# Patient Record
Sex: Female | Born: 1975 | Race: White | Hispanic: No | Marital: Single | Smoking: Former smoker
Health system: Southern US, Community
[De-identification: ages and names within clinical notes are randomized; demographics above are authoritative.]

## PROBLEM LIST (undated history)

## (undated) DIAGNOSIS — I2510 Atherosclerotic heart disease of native coronary artery without angina pectoris: Secondary | ICD-10-CM

## (undated) DIAGNOSIS — F4310 Post-traumatic stress disorder, unspecified: Secondary | ICD-10-CM

## (undated) DIAGNOSIS — I509 Heart failure, unspecified: Secondary | ICD-10-CM

## (undated) DIAGNOSIS — E279 Disorder of adrenal gland, unspecified: Secondary | ICD-10-CM

## (undated) DIAGNOSIS — Z95810 Presence of automatic (implantable) cardiac defibrillator: Secondary | ICD-10-CM

## (undated) DIAGNOSIS — E282 Polycystic ovarian syndrome: Secondary | ICD-10-CM

## (undated) DIAGNOSIS — Z8719 Personal history of other diseases of the digestive system: Secondary | ICD-10-CM

## (undated) DIAGNOSIS — I513 Intracardiac thrombosis, not elsewhere classified: Secondary | ICD-10-CM

## (undated) DIAGNOSIS — G8929 Other chronic pain: Secondary | ICD-10-CM

## (undated) DIAGNOSIS — I999 Unspecified disorder of circulatory system: Secondary | ICD-10-CM

## (undated) DIAGNOSIS — Z7901 Long term (current) use of anticoagulants: Secondary | ICD-10-CM

## (undated) DIAGNOSIS — I739 Peripheral vascular disease, unspecified: Secondary | ICD-10-CM

## (undated) DIAGNOSIS — I7410 Embolism and thrombosis of unspecified parts of aorta: Secondary | ICD-10-CM

## (undated) DIAGNOSIS — I5022 Chronic systolic (congestive) heart failure: Secondary | ICD-10-CM

## (undated) DIAGNOSIS — I429 Cardiomyopathy, unspecified: Secondary | ICD-10-CM

## (undated) DIAGNOSIS — I700 Atherosclerosis of aorta: Secondary | ICD-10-CM

## (undated) DIAGNOSIS — I998 Other disorder of circulatory system: Secondary | ICD-10-CM

## (undated) DIAGNOSIS — T82198A Other mechanical complication of other cardiac electronic device, initial encounter: Secondary | ICD-10-CM

## (undated) DIAGNOSIS — F319 Bipolar disorder, unspecified: Secondary | ICD-10-CM

## (undated) DIAGNOSIS — E278 Other specified disorders of adrenal gland: Secondary | ICD-10-CM

## (undated) DIAGNOSIS — I428 Other cardiomyopathies: Secondary | ICD-10-CM

## (undated) DIAGNOSIS — IMO0002 Reserved for concepts with insufficient information to code with codable children: Secondary | ICD-10-CM

## (undated) HISTORY — DX: Intracardiac thrombosis, not elsewhere classified: I51.3

## (undated) HISTORY — DX: Other disorder of circulatory system: I99.8

## (undated) HISTORY — DX: Bipolar disorder, unspecified: F31.9

## (undated) HISTORY — DX: Other cardiomyopathies: I42.8

## (undated) HISTORY — DX: Chronic systolic (congestive) heart failure: I50.22

## (undated) HISTORY — DX: Unspecified disorder of circulatory system: I99.9

## (undated) HISTORY — DX: Other mechanical complication of other cardiac electronic device, initial encounter: T82.198A

## (undated) HISTORY — DX: Heart failure, unspecified: I50.9

## (undated) HISTORY — DX: Cardiomyopathy, unspecified: I42.9

## (undated) HISTORY — DX: Atherosclerotic heart disease of native coronary artery without angina pectoris: I25.10

## (undated) HISTORY — DX: Long term (current) use of anticoagulants: Z79.01

## (undated) HISTORY — DX: Peripheral vascular disease, unspecified: I73.9

---

## 2012-07-18 DIAGNOSIS — I513 Intracardiac thrombosis, not elsewhere classified: Secondary | ICD-10-CM

## 2012-07-18 HISTORY — DX: Intracardiac thrombosis, not elsewhere classified: I51.3

## 2012-08-01 ENCOUNTER — Emergency Department (HOSPITAL_COMMUNITY): Payer: Worker's Compensation

## 2012-08-01 ENCOUNTER — Encounter (HOSPITAL_COMMUNITY): Payer: Self-pay | Admitting: Emergency Medicine

## 2012-08-01 ENCOUNTER — Inpatient Hospital Stay (HOSPITAL_COMMUNITY)
Admission: EM | Admit: 2012-08-01 | Discharge: 2012-08-10 | DRG: 238 | Disposition: A | Discharge status: Home Health | Payer: 59 | Attending: Vascular Surgery | Admitting: Vascular Surgery

## 2012-08-01 DIAGNOSIS — R209 Unspecified disturbances of skin sensation: Secondary | ICD-10-CM | POA: Diagnosis present

## 2012-08-01 DIAGNOSIS — F411 Generalized anxiety disorder: Secondary | ICD-10-CM | POA: Diagnosis present

## 2012-08-01 DIAGNOSIS — I429 Cardiomyopathy, unspecified: Secondary | ICD-10-CM

## 2012-08-01 DIAGNOSIS — I7409 Other arterial embolism and thrombosis of abdominal aorta: Principal | ICD-10-CM

## 2012-08-01 DIAGNOSIS — Z95828 Presence of other vascular implants and grafts: Secondary | ICD-10-CM

## 2012-08-01 DIAGNOSIS — Z23 Encounter for immunization: Secondary | ICD-10-CM

## 2012-08-01 DIAGNOSIS — I513 Intracardiac thrombosis, not elsewhere classified: Secondary | ICD-10-CM

## 2012-08-01 DIAGNOSIS — I70219 Atherosclerosis of native arteries of extremities with intermittent claudication, unspecified extremity: Secondary | ICD-10-CM

## 2012-08-01 DIAGNOSIS — F3289 Other specified depressive episodes: Secondary | ICD-10-CM | POA: Diagnosis present

## 2012-08-01 DIAGNOSIS — F329 Major depressive disorder, single episode, unspecified: Secondary | ICD-10-CM | POA: Diagnosis present

## 2012-08-01 DIAGNOSIS — I998 Other disorder of circulatory system: Secondary | ICD-10-CM

## 2012-08-01 DIAGNOSIS — E282 Polycystic ovarian syndrome: Secondary | ICD-10-CM | POA: Diagnosis present

## 2012-08-01 DIAGNOSIS — Z7901 Long term (current) use of anticoagulants: Secondary | ICD-10-CM

## 2012-08-01 DIAGNOSIS — I743 Embolism and thrombosis of arteries of the lower extremities: Secondary | ICD-10-CM | POA: Diagnosis present

## 2012-08-01 DIAGNOSIS — I428 Other cardiomyopathies: Secondary | ICD-10-CM | POA: Diagnosis present

## 2012-08-01 DIAGNOSIS — Z720 Tobacco use: Secondary | ICD-10-CM

## 2012-08-01 DIAGNOSIS — F17200 Nicotine dependence, unspecified, uncomplicated: Secondary | ICD-10-CM | POA: Diagnosis present

## 2012-08-01 DIAGNOSIS — D62 Acute posthemorrhagic anemia: Secondary | ICD-10-CM | POA: Diagnosis not present

## 2012-08-01 HISTORY — DX: Polycystic ovarian syndrome: E28.2

## 2012-08-01 LAB — POCT I-STAT, CHEM 8
BUN: 6 mg/dL (ref 6–23)
Calcium, Ion: 1.16 mmol/L (ref 1.12–1.23)
Chloride: 96 mEq/L (ref 96–112)
Creatinine, Ser: 0.6 mg/dL (ref 0.50–1.10)
Glucose, Bld: 95 mg/dL (ref 70–99)
HCT: 42 % (ref 36.0–46.0)
Hemoglobin: 14.3 g/dL (ref 12.0–15.0)
Potassium: 3.6 mEq/L (ref 3.5–5.1)
Sodium: 132 mEq/L — ABNORMAL LOW (ref 135–145)
TCO2: 26 mmol/L (ref 0–100)

## 2012-08-01 LAB — SEDIMENTATION RATE: Sed Rate: 27 mm/hr — ABNORMAL HIGH (ref 0–22)

## 2012-08-01 MED ORDER — DIAZEPAM 5 MG/ML IJ SOLN
5.0000 mg | Freq: Once | INTRAMUSCULAR | Status: AC
  Administered 2012-08-01: 5 mg via INTRAVENOUS
  Filled 2012-08-01: qty 2

## 2012-08-01 MED ORDER — MORPHINE SULFATE 2 MG/ML IJ SOLN
2.0000 mg | INTRAMUSCULAR | Status: DC | PRN
  Administered 2012-08-01 – 2012-08-02 (×3): 2 mg via INTRAVENOUS
  Administered 2012-08-02: 4 mg via INTRAVENOUS
  Administered 2012-08-02: 2 mg via INTRAVENOUS
  Administered 2012-08-02: 4 mg via INTRAVENOUS
  Filled 2012-08-01: qty 1
  Filled 2012-08-01: qty 2
  Filled 2012-08-01 (×2): qty 1
  Filled 2012-08-01: qty 2

## 2012-08-01 MED ORDER — IOHEXOL 350 MG/ML SOLN
100.0000 mL | Freq: Once | INTRAVENOUS | Status: AC | PRN
  Administered 2012-08-01: 100 mL via INTRAVENOUS

## 2012-08-01 MED ORDER — HYDROMORPHONE HCL PF 1 MG/ML IJ SOLN
0.5000 mg | Freq: Once | INTRAMUSCULAR | Status: AC
  Administered 2012-08-01: 0.5 mg via INTRAVENOUS
  Filled 2012-08-01: qty 1

## 2012-08-01 MED ORDER — LORAZEPAM 2 MG/ML IJ SOLN
1.0000 mg | Freq: Once | INTRAMUSCULAR | Status: AC
Start: 2012-08-01 — End: 2012-08-01
  Administered 2012-08-01: 1 mg via INTRAVENOUS
  Filled 2012-08-01: qty 1

## 2012-08-01 MED ORDER — HEPARIN BOLUS VIA INFUSION
4000.0000 [IU] | Freq: Once | INTRAVENOUS | Status: AC
  Administered 2012-08-01: 4000 [IU] via INTRAVENOUS

## 2012-08-01 MED ORDER — SODIUM CHLORIDE 0.9 % IV BOLUS (SEPSIS)
1000.0000 mL | Freq: Once | INTRAVENOUS | Status: AC
  Administered 2012-08-01: 1000 mL via INTRAVENOUS

## 2012-08-01 MED ORDER — HEPARIN (PORCINE) IN NACL 100-0.45 UNIT/ML-% IJ SOLN
1000.0000 [IU]/h | INTRAMUSCULAR | Status: DC
  Administered 2012-08-01: 1000 [IU]/h via INTRAVENOUS
  Filled 2012-08-01: qty 250

## 2012-08-01 NOTE — ED Notes (Signed)
Patient states she was setting a tray down at work and injured her lower back.  Patient also reports numbness and tingling in bilateral legs and feet.

## 2012-08-01 NOTE — ED Provider Notes (Signed)
Patient with x-ray findings consistent with aortic occlusion. Patient to be transferred over to [facility] to see the vascular surgeon.  Toy Baker, MD 08/01/12 [phone]

## 2012-08-01 NOTE — ED Provider Notes (Signed)
Medical screening examination/treatment/procedure(s) were conducted as a shared visit with non-physician practitioner(s) and myself.  I personally evaluated the patient during the encounter.  37yF with lower back pain and numbness/tingling in feet. Atraumatic back pain. Onset months ago. Exam significant for R foot drop. B/l feet cool. Delayed cap refill. I could not palpate or doppler a DP or PT pulse in either foot. Possibly two different processes?   Will CTA to eval for vascular pathology. Given back pain will also MR for compressive etiology. No obvious toxin. No hx of diabetes.   Raeford Razor, MD 08/01/12 [phone]
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
## 2012-08-02 ENCOUNTER — Inpatient Hospital Stay (HOSPITAL_COMMUNITY): Payer: Worker's Compensation | Admitting: Anesthesiology

## 2012-08-02 ENCOUNTER — Inpatient Hospital Stay (HOSPITAL_COMMUNITY): Payer: Worker's Compensation

## 2012-08-02 ENCOUNTER — Encounter (HOSPITAL_COMMUNITY): Payer: Self-pay | Admitting: Anesthesiology

## 2012-08-02 ENCOUNTER — Encounter (HOSPITAL_COMMUNITY)
Admission: EM | Disposition: A | Discharge status: Home Health | Payer: Self-pay | Source: Home / Self Care | Attending: Vascular Surgery

## 2012-08-02 ENCOUNTER — Encounter (HOSPITAL_COMMUNITY): Payer: Self-pay | Admitting: *Deleted

## 2012-08-02 HISTORY — PX: AORTA - BILATERAL FEMORAL ARTERY BYPASS GRAFT: SHX1175

## 2012-08-02 LAB — CBC
MCV: 83.9 fL (ref 78.0–100.0)
MCV: 85.9 fL (ref 78.0–100.0)
Platelets: 211 10*3/uL (ref 150–400)
Platelets: 187 10*3/uL (ref 150–400)
RBC: 3.41 MIL/uL — ABNORMAL LOW (ref 3.87–5.11)
RDW: 15.3 % (ref 11.5–15.5)
RDW: 15.6 % — ABNORMAL HIGH (ref 11.5–15.5)
WBC: 11 10*3/uL — ABNORMAL HIGH (ref 4.0–10.5)
WBC: 14.5 10*3/uL — ABNORMAL HIGH (ref 4.0–10.5)

## 2012-08-02 LAB — COMPREHENSIVE METABOLIC PANEL
ALT: 29 U/L (ref 0–35)
AST: 28 U/L (ref 0–37)
Albumin: 3.2 g/dL — ABNORMAL LOW (ref 3.5–5.2)
Chloride: 101 mEq/L (ref 96–112)
Creatinine, Ser: 0.61 mg/dL (ref 0.50–1.10)
Potassium: 3.7 mEq/L (ref 3.5–5.1)
Sodium: 136 mEq/L (ref 135–145)
Total Bilirubin: 0.2 mg/dL — ABNORMAL LOW (ref 0.3–1.2)

## 2012-08-02 LAB — URINALYSIS, ROUTINE W REFLEX MICROSCOPIC
Bilirubin Urine: NEGATIVE
Glucose, UA: NEGATIVE mg/dL
Nitrite: NEGATIVE
Specific Gravity, Urine: 1.013 (ref 1.005–1.030)
pH: 6 (ref 5.0–8.0)

## 2012-08-02 LAB — POCT I-STAT 3, ART BLOOD GAS (G3+)
Acid-base deficit: 1 mmol/L (ref 0.0–2.0)
Bicarbonate: 23.2 mEq/L (ref 20.0–24.0)
Patient temperature: 97.6
pCO2 arterial: 37 mmHg (ref 35.0–45.0)
pCO2 arterial: 38.3 mmHg (ref 35.0–45.0)
pH, Arterial: 7.394 (ref 7.350–7.450)
pH, Arterial: 7.403 (ref 7.350–7.450)
pO2, Arterial: 49 mmHg — ABNORMAL LOW (ref 80.0–100.0)

## 2012-08-02 LAB — BASIC METABOLIC PANEL
CO2: 23 mEq/L (ref 19–32)
Calcium: 8.1 mg/dL — ABNORMAL LOW (ref 8.4–10.5)
Chloride: 104 mEq/L (ref 96–112)
Creatinine, Ser: 0.47 mg/dL — ABNORMAL LOW (ref 0.50–1.10)
GFR calc Af Amer: >90 mL/min (ref >90)
Sodium: 134 mEq/L — ABNORMAL LOW (ref 135–145)

## 2012-08-02 LAB — MAGNESIUM: Magnesium: 1.7 mg/dL (ref 1.5–2.5)

## 2012-08-02 LAB — PROTIME-INR
INR: 1 (ref 0.00–1.49)
INR: 1.09 (ref 0.00–1.49)
Prothrombin Time: 14 seconds (ref 11.6–15.2)

## 2012-08-02 LAB — HEMOGLOBIN A1C
Hgb A1c MFr Bld: 5.4 % (ref <5.7)
Mean Plasma Glucose: 108 mg/dL (ref <117)

## 2012-08-02 LAB — C-REACTIVE PROTEIN: CRP: 7.8 mg/dL — ABNORMAL HIGH (ref <0.60)

## 2012-08-02 LAB — URINE MICROSCOPIC-ADD ON

## 2012-08-02 SURGERY — CREATION, BYPASS, ARTERIAL, AORTA TO FEMORAL, BILATERAL, USING GRAFT
Anesthesia: General | Site: Abdomen | Wound class: Clean

## 2012-08-02 MED ORDER — MIDAZOLAM HCL 2 MG/2ML IJ SOLN
INTRAMUSCULAR | Status: AC
  Filled 2012-08-02: qty 2

## 2012-08-02 MED ORDER — DEXTROSE 5 % IV SOLN
INTRAVENOUS | Status: AC
  Filled 2012-08-02: qty 50

## 2012-08-02 MED ORDER — ACETAMINOPHEN 325 MG PO TABS: 325.0000 mg | ORAL_TABLET | ORAL | Status: DC | PRN

## 2012-08-02 MED ORDER — MORPHINE SULFATE 2 MG/ML IJ SOLN
2.0000 mg | INTRAMUSCULAR | Status: DC | PRN
  Administered 2012-08-02: 2 mg via INTRAVENOUS
  Administered 2012-08-02: 5 mg via INTRAVENOUS
  Administered 2012-08-02: 2 mg via INTRAVENOUS
  Administered 2012-08-02: 4 mg via INTRAVENOUS
  Administered 2012-08-02: 5 mg via INTRAVENOUS
  Administered 2012-08-03: 4 mg via INTRAVENOUS
  Administered 2012-08-03 (×2): 2 mg via INTRAVENOUS
  Filled 2012-08-02: qty 2
  Filled 2012-08-02: qty 1
  Filled 2012-08-02 (×2): qty 2
  Filled 2012-08-02 (×3): qty 1
  Filled 2012-08-02: qty 3
  Filled 2012-08-02: qty 1

## 2012-08-02 MED ORDER — MELOXICAM 7.5 MG PO TABS
7.5000 mg | ORAL_TABLET | Freq: Every day | ORAL | Status: DC
  Filled 2012-08-02: qty 1

## 2012-08-02 MED ORDER — ONDANSETRON HCL 4 MG/2ML IJ SOLN
4.0000 mg | Freq: Four times a day (QID) | INTRAMUSCULAR | Status: DC | PRN
  Administered 2012-08-05 – 2012-08-08 (×2): 4 mg via INTRAVENOUS
  Filled 2012-08-02 (×2): qty 2

## 2012-08-02 MED ORDER — HYDRALAZINE HCL 20 MG/ML IJ SOLN
10.0000 mg | INTRAMUSCULAR | Status: DC | PRN
  Filled 2012-08-02: qty 0.5

## 2012-08-02 MED ORDER — ARTIFICIAL TEARS OP OINT
TOPICAL_OINTMENT | OPHTHALMIC | Status: DC | PRN
  Administered 2012-08-02: 1 via OPHTHALMIC

## 2012-08-02 MED ORDER — METOPROLOL TARTRATE 1 MG/ML IV SOLN: 2.0000 mg | INTRAVENOUS | Status: DC | PRN

## 2012-08-02 MED ORDER — FENTANYL CITRATE 0.05 MG/ML IJ SOLN
INTRAMUSCULAR | Status: DC | PRN
Start: 2012-08-02 — End: 2012-08-02
  Administered 2012-08-02: 100 ug via INTRAVENOUS
  Administered 2012-08-02 (×3): 50 ug via INTRAVENOUS
  Administered 2012-08-02 (×3): 100 ug via INTRAVENOUS
  Administered 2012-08-02: 50 ug via INTRAVENOUS

## 2012-08-02 MED ORDER — ACETAMINOPHEN 650 MG RE SUPP: 325.0000 mg | RECTAL | Status: DC | PRN

## 2012-08-02 MED ORDER — MIDAZOLAM HCL 5 MG/5ML IJ SOLN
INTRAMUSCULAR | Status: DC | PRN
  Administered 2012-08-02: 2 mg via INTRAVENOUS

## 2012-08-02 MED ORDER — POTASSIUM CHLORIDE 10 MEQ/50ML IV SOLN
INTRAVENOUS | Status: AC
  Administered 2012-08-02: 10 meq via INTRAVENOUS
  Filled 2012-08-02: qty 50

## 2012-08-02 MED ORDER — ONDANSETRON HCL 4 MG/2ML IJ SOLN: 4.0000 mg | Freq: Four times a day (QID) | INTRAMUSCULAR | Status: DC | PRN

## 2012-08-02 MED ORDER — CEFUROXIME SODIUM 1.5 G IJ SOLR
INTRAMUSCULAR | Status: AC
  Administered 2012-08-02: 1.5 g via INTRAVENOUS
  Filled 2012-08-02: qty 1.5

## 2012-08-02 MED ORDER — MEPERIDINE HCL 25 MG/ML IJ SOLN: 6.2500 mg | INTRAMUSCULAR | Status: DC | PRN

## 2012-08-02 MED ORDER — LABETALOL HCL 5 MG/ML IV SOLN
10.0000 mg | INTRAVENOUS | Status: DC | PRN
  Filled 2012-08-02: qty 4

## 2012-08-02 MED ORDER — SODIUM CHLORIDE 0.9 % IV SOLN: 500.0000 mL | Freq: Once | INTRAVENOUS | Status: AC | PRN

## 2012-08-02 MED ORDER — ALUM & MAG HYDROXIDE-SIMETH 200-200-20 MG/5ML PO SUSP: 15.0000 mL | ORAL | Status: DC | PRN

## 2012-08-02 MED ORDER — KCL IN DEXTROSE-NACL 20-5-0.45 MEQ/L-%-% IV SOLN
INTRAVENOUS | Status: AC
  Administered 2012-08-02: 1000 mL
  Filled 2012-08-02: qty 1000

## 2012-08-02 MED ORDER — CEFAZOLIN SODIUM 1-5 GM-% IV SOLN: 1.0000 g | INTRAVENOUS | Status: DC

## 2012-08-02 MED ORDER — KCL IN DEXTROSE-NACL 20-5-0.9 MEQ/L-%-% IV SOLN
INTRAVENOUS | Status: DC
  Administered 2012-08-03 – 2012-08-05 (×3): via INTRAVENOUS
  Filled 2012-08-02 (×14): qty 1000

## 2012-08-02 MED ORDER — PANTOPRAZOLE SODIUM 40 MG IV SOLR
40.0000 mg | Freq: Every day | INTRAVENOUS | Status: DC
  Administered 2012-08-02 – 2012-08-09 (×7): 40 mg via INTRAVENOUS
  Filled 2012-08-02 (×11): qty 40

## 2012-08-02 MED ORDER — THROMBIN 20000 UNITS EX SOLR
CUTANEOUS | Status: AC
  Filled 2012-08-02: qty 20000

## 2012-08-02 MED ORDER — NAPROXEN 250 MG PO TABS
250.0000 mg | ORAL_TABLET | Freq: Two times a day (BID) | ORAL | Status: DC
  Administered 2012-08-02: 250 mg via ORAL
  Filled 2012-08-02 (×3): qty 1

## 2012-08-02 MED ORDER — ONDANSETRON HCL 4 MG/2ML IJ SOLN
INTRAMUSCULAR | Status: DC | PRN
  Administered 2012-08-02: 4 mg via INTRAVENOUS

## 2012-08-02 MED ORDER — ALBUMIN HUMAN 5 % IV SOLN
INTRAVENOUS | Status: DC | PRN
  Administered 2012-08-02 (×2): via INTRAVENOUS

## 2012-08-02 MED ORDER — PROTAMINE SULFATE 10 MG/ML IV SOLN
INTRAVENOUS | Status: DC | PRN
  Administered 2012-08-02: 30 mg via INTRAVENOUS

## 2012-08-02 MED ORDER — SODIUM CHLORIDE 0.9 % IJ SOLN
INTRAMUSCULAR | Status: DC | PRN
  Administered 2012-08-02: 12:00:00

## 2012-08-02 MED ORDER — HYDROMORPHONE HCL PF 1 MG/ML IJ SOLN
0.2500 mg | INTRAMUSCULAR | Status: DC | PRN
  Administered 2012-08-02 (×2): 0.5 mg via INTRAVENOUS

## 2012-08-02 MED ORDER — GUAIFENESIN-DM 100-10 MG/5ML PO SYRP: 15.0000 mL | ORAL_SOLUTION | ORAL | Status: DC | PRN

## 2012-08-02 MED ORDER — IOHEXOL 300 MG/ML  SOLN
INTRAMUSCULAR | Status: DC | PRN
  Administered 2012-08-02: 40 mL via INTRAVENOUS

## 2012-08-02 MED ORDER — POTASSIUM CHLORIDE CRYS ER 20 MEQ PO TBCR
20.0000 meq | EXTENDED_RELEASE_TABLET | Freq: Every day | ORAL | Status: DC | PRN
  Administered 2012-08-10: 40 meq via ORAL
  Filled 2012-08-02: qty 2

## 2012-08-02 MED ORDER — POTASSIUM CHLORIDE 10 MEQ/50ML IV SOLN: 10.0000 meq | Freq: Once | INTRAVENOUS | Status: AC

## 2012-08-02 MED ORDER — METOPROLOL TARTRATE 1 MG/ML IV SOLN
2.0000 mg | INTRAVENOUS | Status: DC | PRN
Start: 2012-08-02 — End: 2012-08-02

## 2012-08-02 MED ORDER — THROMBIN 20000 UNITS EX KIT
PACK | CUTANEOUS | Status: DC | PRN
  Administered 2012-08-02: 20000 [IU] via TOPICAL

## 2012-08-02 MED ORDER — HEPARIN SODIUM (PORCINE) 1000 UNIT/ML IJ SOLN
INTRAMUSCULAR | Status: DC | PRN
  Administered 2012-08-02: 7000 [IU] via INTRAVENOUS
  Administered 2012-08-02: 1500 [IU] via INTRAVENOUS

## 2012-08-02 MED ORDER — NAPROXEN SODIUM 220 MG PO TABS: 220.0000 mg | ORAL_TABLET | Freq: Two times a day (BID) | ORAL | Status: DC

## 2012-08-02 MED ORDER — HYDROMORPHONE HCL PF 1 MG/ML IJ SOLN
INTRAMUSCULAR | Status: AC
  Filled 2012-08-02: qty 1

## 2012-08-02 MED ORDER — PHENYLEPHRINE HCL 10 MG/ML IJ SOLN
10.0000 mg | INTRAVENOUS | Status: DC | PRN
  Administered 2012-08-02: 10 ug/min via INTRAVENOUS

## 2012-08-02 MED ORDER — FENTANYL CITRATE 0.05 MG/ML IJ SOLN
INTRAMUSCULAR | Status: AC
  Filled 2012-08-02: qty 2

## 2012-08-02 MED ORDER — PROMETHAZINE HCL 25 MG/ML IJ SOLN: 6.2500 mg | INTRAMUSCULAR | Status: DC | PRN

## 2012-08-02 MED ORDER — NEOSTIGMINE METHYLSULFATE 1 MG/ML IJ SOLN
INTRAMUSCULAR | Status: DC | PRN
  Administered 2012-08-02: 4 mg via INTRAVENOUS

## 2012-08-02 MED ORDER — SODIUM CHLORIDE 0.9 % IR SOLN
Status: DC | PRN
  Administered 2012-08-02: 12:00:00

## 2012-08-02 MED ORDER — THROMBIN 20000 UNITS EX SOLR
CUTANEOUS | Status: DC | PRN
  Administered 2012-08-02: 15:00:00 via TOPICAL

## 2012-08-02 MED ORDER — PHENOL 1.4 % MT LIQD
1.0000 | OROMUCOSAL | Status: DC | PRN
  Administered 2012-08-07: 1 via OROMUCOSAL

## 2012-08-02 MED ORDER — PHENOL 1.4 % MT LIQD
1.0000 | OROMUCOSAL | Status: DC | PRN
  Filled 2012-08-02: qty 177

## 2012-08-02 MED ORDER — POTASSIUM CHLORIDE CRYS ER 20 MEQ PO TBCR
20.0000 meq | EXTENDED_RELEASE_TABLET | Freq: Once | ORAL | Status: AC
  Administered 2012-08-02: 20 meq via ORAL
  Filled 2012-08-02: qty 1

## 2012-08-02 MED ORDER — ROCURONIUM BROMIDE 100 MG/10ML IV SOLN
INTRAVENOUS | Status: DC | PRN
  Administered 2012-08-02: 10 mg via INTRAVENOUS
  Administered 2012-08-02: 50 mg via INTRAVENOUS
  Administered 2012-08-02: 10 mg via INTRAVENOUS
  Administered 2012-08-02: 20 mg via INTRAVENOUS

## 2012-08-02 MED ORDER — OXYCODONE-ACETAMINOPHEN 5-325 MG PO TABS
1.0000 | ORAL_TABLET | ORAL | Status: DC | PRN
  Administered 2012-08-02: 2 via ORAL
  Filled 2012-08-02 (×2): qty 2

## 2012-08-02 MED ORDER — ENOXAPARIN SODIUM 40 MG/0.4ML ~~LOC~~ SOLN
40.0000 mg | SUBCUTANEOUS | Status: DC
  Administered 2012-08-03: 40 mg via SUBCUTANEOUS
  Filled 2012-08-02 (×2): qty 0.4

## 2012-08-02 MED ORDER — LACTATED RINGERS IV SOLN
INTRAVENOUS | Status: DC | PRN
  Administered 2012-08-02: 10:00:00 via INTRAVENOUS

## 2012-08-02 MED ORDER — LACTATED RINGERS IV SOLN
INTRAVENOUS | Status: DC | PRN
  Administered 2012-08-02 (×3): via INTRAVENOUS

## 2012-08-02 MED ORDER — DOPAMINE-DEXTROSE 3.2-5 MG/ML-% IV SOLN: 3.0000 ug/kg/min | INTRAVENOUS | Status: DC | PRN

## 2012-08-02 MED ORDER — PHENYLEPHRINE HCL 10 MG/ML IJ SOLN
INTRAMUSCULAR | Status: DC | PRN
  Administered 2012-08-02: 40 ug via INTRAVENOUS
  Administered 2012-08-02: 80 ug via INTRAVENOUS

## 2012-08-02 MED ORDER — OXYCODONE HCL 5 MG PO TABS: 5.0000 mg | ORAL_TABLET | Freq: Once | ORAL | Status: DC | PRN

## 2012-08-02 MED ORDER — KCL IN DEXTROSE-NACL 20-5-0.9 MEQ/L-%-% IV SOLN
INTRAVENOUS | Status: DC
  Administered 2012-08-02: 04:00:00 via INTRAVENOUS
  Filled 2012-08-02 (×3): qty 1000

## 2012-08-02 MED ORDER — 0.9 % SODIUM CHLORIDE (POUR BTL) OPTIME
TOPICAL | Status: DC | PRN
  Administered 2012-08-02: 1000 mL

## 2012-08-02 MED ORDER — DEXTROSE 5 % IV SOLN
1.5000 g | Freq: Two times a day (BID) | INTRAVENOUS | Status: AC
  Administered 2012-08-02 – 2012-08-03 (×2): 1.5 g via INTRAVENOUS
  Filled 2012-08-02 (×2): qty 1.5

## 2012-08-02 MED ORDER — MAGNESIUM SULFATE 40 MG/ML IJ SOLN
2.0000 g | Freq: Every day | INTRAMUSCULAR | Status: DC | PRN
  Filled 2012-08-02: qty 50

## 2012-08-02 MED ORDER — CYCLOBENZAPRINE HCL 10 MG PO TABS
10.0000 mg | ORAL_TABLET | Freq: Three times a day (TID) | ORAL | Status: DC | PRN
  Administered 2012-08-02: 10 mg via ORAL
  Filled 2012-08-02 (×2): qty 1

## 2012-08-02 MED ORDER — GLYCOPYRROLATE 0.2 MG/ML IJ SOLN
INTRAMUSCULAR | Status: DC | PRN
  Administered 2012-08-02: 0.6 mg via INTRAVENOUS

## 2012-08-02 MED ORDER — OXYCODONE HCL 5 MG/5ML PO SOLN: 5.0000 mg | Freq: Once | ORAL | Status: DC | PRN

## 2012-08-02 MED ORDER — PANTOPRAZOLE SODIUM 40 MG PO TBEC: 40.0000 mg | DELAYED_RELEASE_TABLET | Freq: Every day | ORAL | Status: DC

## 2012-08-02 MED ORDER — PROPOFOL 10 MG/ML IV BOLUS
INTRAVENOUS | Status: DC | PRN
  Administered 2012-08-02: 20 mg via INTRAVENOUS
  Administered 2012-08-02: 180 mg via INTRAVENOUS

## 2012-08-02 SURGICAL SUPPLY — 71 items
CANISTER SUCTION 2500CC (MISCELLANEOUS) ×2 IMPLANT
CANNULA VESSEL W/WING WO/VALVE (CANNULA) ×2 IMPLANT
CATH EMB 3FR 80CM (CATHETERS) ×2 IMPLANT
CATH EMB 4FR 80CM (CATHETERS) ×2 IMPLANT
CLIP TI MEDIUM 24 (CLIP) ×2 IMPLANT
CLIP TI WIDE RED SMALL 24 (CLIP) ×2 IMPLANT
CLOTH BEACON ORANGE TIMEOUT ST (SAFETY) ×2 IMPLANT
COVER MAYO STAND STRL (DRAPES) ×2 IMPLANT
COVER SURGICAL LIGHT HANDLE (MISCELLANEOUS) ×2 IMPLANT
DERMABOND ADVANCED (GAUZE/BANDAGES/DRESSINGS) ×1
DERMABOND ADVANCED .7 DNX12 (GAUZE/BANDAGES/DRESSINGS) ×1 IMPLANT
DRAPE WARM FLUID 44X44 (DRAPE) ×2 IMPLANT
DRAPE X-RAY CASS 24X20 (DRAPES) ×4 IMPLANT
DRSG COVADERM 4X14 (GAUZE/BANDAGES/DRESSINGS) ×2 IMPLANT
DRSG COVADERM 4X6 (GAUZE/BANDAGES/DRESSINGS) ×2 IMPLANT
ELECT BLADE 4.0 EZ CLEAN MEGAD (MISCELLANEOUS) ×2
ELECT BLADE 6.5 EXT (BLADE) IMPLANT
ELECT CAUTERY BLADE 6.4 (BLADE) ×2 IMPLANT
ELECT REM PT RETURN 9FT ADLT (ELECTROSURGICAL) ×2
ELECTRODE BLDE 4.0 EZ CLN MEGD (MISCELLANEOUS) ×1 IMPLANT
ELECTRODE REM PT RTRN 9FT ADLT (ELECTROSURGICAL) ×1 IMPLANT
GLOVE BIO SURGEON STRL SZ 6.5 (GLOVE) ×8 IMPLANT
GLOVE BIO SURGEON STRL SZ7 (GLOVE) ×6 IMPLANT
GLOVE BIO SURGEON STRL SZ7.5 (GLOVE) ×4 IMPLANT
GLOVE BIOGEL PI IND STRL 6.5 (GLOVE) ×2 IMPLANT
GLOVE BIOGEL PI IND STRL 7.0 (GLOVE) ×3 IMPLANT
GLOVE BIOGEL PI IND STRL 8 (GLOVE) ×1 IMPLANT
GLOVE BIOGEL PI INDICATOR 6.5 (GLOVE) ×2
GLOVE BIOGEL PI INDICATOR 7.0 (GLOVE) ×3
GLOVE BIOGEL PI INDICATOR 8 (GLOVE) ×1
GOWN STRL NON-REIN LRG LVL3 (GOWN DISPOSABLE) ×12 IMPLANT
GRAFT HEMASHIELD 12X7MM (Vascular Products) ×2 IMPLANT
INSERT FOGARTY 61MM (MISCELLANEOUS) ×2 IMPLANT
INSERT FOGARTY SM (MISCELLANEOUS) ×4 IMPLANT
KIT BASIN OR (CUSTOM PROCEDURE TRAY) ×2 IMPLANT
KIT ROOM TURNOVER OR (KITS) ×2 IMPLANT
NS IRRIG 1000ML POUR BTL (IV SOLUTION) ×6 IMPLANT
PACK AORTA (CUSTOM PROCEDURE TRAY) ×2 IMPLANT
PAD ARMBOARD 7.5X6 YLW CONV (MISCELLANEOUS) ×4 IMPLANT
RETAINER VISCERA MED (MISCELLANEOUS) ×2 IMPLANT
SET COLLECT BLD 21X3/4 12 PB (MISCELLANEOUS) ×2 IMPLANT
SPECIMEN JAR MEDIUM (MISCELLANEOUS) ×2 IMPLANT
SPONGE GAUZE 4X4 12PLY (GAUZE/BANDAGES/DRESSINGS) ×2 IMPLANT
SPONGE SURGIFOAM ABS GEL 100 (HEMOSTASIS) IMPLANT
STOPCOCK 4 WAY LG BORE MALE ST (IV SETS) ×2 IMPLANT
SUT ETHIBOND 5 LR DA (SUTURE) IMPLANT
SUT PDS AB 1 TP1 54 (SUTURE) ×4 IMPLANT
SUT PROLENE 2 0 MH 48 (SUTURE) IMPLANT
SUT PROLENE 3 0 SH 48 (SUTURE) IMPLANT
SUT PROLENE 3 0 SH1 36 (SUTURE) ×8 IMPLANT
SUT PROLENE 5 0 C 1 24 (SUTURE) ×2 IMPLANT
SUT PROLENE 5 0 C 1 36 (SUTURE) ×2 IMPLANT
SUT PROLENE 6 0 BV (SUTURE) ×2 IMPLANT
SUT SILK 2 0 SH CR/8 (SUTURE) ×2 IMPLANT
SUT SILK 2 0 TIES 17X18 (SUTURE) ×1
SUT SILK 2-0 18XBRD TIE BLK (SUTURE) ×1 IMPLANT
SUT SILK 3 0 TIES 17X18 (SUTURE) ×2
SUT SILK 3-0 18XBRD TIE BLK (SUTURE) ×2 IMPLANT
SUT VIC AB 2-0 CTB1 (SUTURE) ×4 IMPLANT
SUT VIC AB 3-0 SH 27 (SUTURE) ×4
SUT VIC AB 3-0 SH 27X BRD (SUTURE) ×4 IMPLANT
SUT VICRYL 4-0 PS2 18IN ABS (SUTURE) ×8 IMPLANT
SYR 20CC LL (SYRINGE) ×2 IMPLANT
SYR TB 1ML LUER SLIP (SYRINGE) ×2 IMPLANT
TOWEL BLUE STERILE X RAY DET (MISCELLANEOUS) ×4 IMPLANT
TOWEL OR 17X24 6PK STRL BLUE (TOWEL DISPOSABLE) ×6 IMPLANT
TOWEL OR 17X26 10 PK STRL BLUE (TOWEL DISPOSABLE) ×8 IMPLANT
TRAY FOLEY CATH 14FRSI W/METER (CATHETERS) ×2 IMPLANT
TUBE CONNECTING 12X1/4 (SUCTIONS) ×2 IMPLANT
TUBING EXTENTION W/L.L. (IV SETS) ×2 IMPLANT
WATER STERILE IRR 1000ML POUR (IV SOLUTION) ×4 IMPLANT

## 2012-08-02 NOTE — Preoperative (Signed)
Beta Blockers   Reason not to administer Beta Blockers:Not Applicable 

## 2012-08-02 NOTE — Anesthesia Postprocedure Evaluation (Signed)
  Anesthesia Post-op Note  Patient: Diana Moreno  Procedure(s) Performed: Procedure(s): AORTA BIFEMORAL BYPASS GRAFT with bilateral femoral embolectomies and intraoperative arteriogram (N/A)  Patient Location: PACU  Anesthesia Type:General  Level of Consciousness: awake and sedated  Airway and Oxygen Therapy: Patient Spontanous Breathing  Post-op Pain: mild  Post-op Assessment: Post-op Vital signs reviewed  Post-op Vital Signs: stable  Complications: No apparent anesthesia complications

## 2012-08-02 NOTE — Transfer of Care (Signed)
Immediate Anesthesia Transfer of Care Note  Patient: Diana Moreno  Procedure(s) Performed: Procedure(s): AORTA BIFEMORAL BYPASS GRAFT with bilateral femoral embolectomies and intraoperative arteriogram (N/A)  Patient Location: PACU  Anesthesia Type:General  Level of Consciousness: awake, alert , oriented and sedated  Airway & Oxygen Therapy: Patient Spontanous Breathing and Patient connected to nasal cannula oxygen  Post-op Assessment: Report given to PACU RN, Post -op Vital signs reviewed and stable and Patient moving all extremities  Post vital signs: Reviewed and stable  Complications: No apparent anesthesia complications

## 2012-08-02 NOTE — Progress Notes (Signed)
VASCULAR PROGRESS NOTE  SUBJECTIVE: Still with significant pain in both feet and paresthesias of both feet.  PHYSICAL EXAM: Filed Vitals:   08/02/12 0145 08/02/12 0330 08/02/12 0444 08/02/12 0545  BP: 113/74 140/90 153/111 128/78  Pulse: 100  113 110  Temp: 98.4 F (36.9 C) 97.1 F (36.2 C) 98.1 F (36.7 C)   TempSrc: Oral Oral Oral   Resp: 19 22 21    Height:      Weight: 151 lb 1.6 oz (68.539 kg)     SpO2: 97% 96% 90%    No change in exam. Unable to obtain Doppler flow in both feet. Foot drop on right is still present. She states that she has had this for approximately one week.   LABS: Lab Results  Component Value Date   WBC 11.0* 08/02/2012   HGB 11.4* 08/02/2012   HCT 32.9* 08/02/2012   MCV 83.9 08/02/2012   PLT 211 08/02/2012   Lab Results  Component Value Date   CREATININE 0.61 08/02/2012   Lab Results  Component Value Date   INR 1.00 08/02/2012   Hypercoagulable workup so far shows that she has an elevated CRP at 7.8. Anti-thrombin 3 function is normal. Platelet count is normal at 211,000.  ASSESSMENT AND PLAN:  This patient presents with an aortic occlusion likely related to thrombosis or embolus. She developed back pain on March 5 and states that she did develop paresthesias in the right foot at that time. She subsequently developed paresthesias in the left foot approximately 5 days ago. She ultimately presented to the emergency department and on CT he has an aortic occlusion with significant clot burden in the infrarenal aorta. I have sent a hypercoagulable panel which was obtained before her heparin was started. Her CT scan does not show any significant atherosclerotic disease or aneurysmal disease in the thoracic or abdominal aorta to explain her aortic thrombosis or embolus. Otherwise no atrial thrombus was noted on CT although I think she will ultimately need a transesophageal echo. Regardless, she has persistent pain in both feet with a foot drop on the right and  I think this could become a limb threatening situation. For this reason I recommended that we proceed with revascularization. Given the clot burden in the aorta I think it would be very unlikely for femoral embolectomies to be successful. She will most likely require aortobifemoral bypass grafting. If it is at all possible to retrieve her aortic thrombus from and aortic approach and avoid placement of a prosthetic graft then certainly we will consider this.have reviewed the indications for surgery and the potential complications including but not limited to, leading, continued lower extremity ischemia, renal insufficiency, continued clotting problems, and limb loss. Discuss this with the patient and her mother and they are agreeable to proceed today. She will need a hematology consult postoperatively and also cardiology consult for TEE.  Cari Caraway Beeper: [phone] 08/02/2012

## 2012-08-02 NOTE — ED Provider Notes (Signed)
Pt awake alert and stable in [facility] ED transferred from [facility] for evaluation by vascular surgery for aortic occlusion and admission.  Hurman Horn, MD 08/02/12 [phone]

## 2012-08-02 NOTE — Anesthesia Preprocedure Evaluation (Addendum)
Anesthesia Evaluation  Patient identified by MRN, date of birth, ID band Patient awake    Reviewed: Allergy & Precautions, H&P , NPO status , Patient's Chart, lab work & pertinent test results  History of Anesthesia Complications Negative for: history of anesthetic complications  Airway Mallampati: I TM Distance: >3 FB Neck ROM: Full    Dental no notable dental hx. (+) Teeth Intact, Dental Advisory Given and Chipped,    Pulmonary neg pulmonary ROS,  breath sounds clear to auscultation  Pulmonary exam normal       Cardiovascular + Peripheral Vascular Disease negative cardio ROS  IRhythm:Regular Rate:Normal  Aortic occlusion,   Neuro/Psych Anxiety Depression  Neuromuscular disease negative neurological ROS  negative psych ROS   GI/Hepatic negative GI ROS, Neg liver ROS,   Endo/Other  negative endocrine ROS  Renal/GU negative Renal ROS  negative genitourinary   Musculoskeletal negative musculoskeletal ROS (+)   Abdominal (+)  Abdomen: soft. Bowel sounds: normal.  Peds negative pediatric ROS (+)  Hematology negative hematology ROS (+)   Anesthesia Other Findings   Reproductive/Obstetrics negative OB ROS                         Anesthesia Physical Anesthesia Plan  ASA: I and emergent  Anesthesia Plan: General   Post-op Pain Management:    Induction:   Airway Management Planned:   Additional Equipment:   Intra-op Plan:   Post-operative Plan:   Informed Consent: I have reviewed the patients History and Physical, chart, labs and discussed the procedure including the risks, benefits and alternatives for the proposed anesthesia with the patient or authorized representative who has indicated his/her understanding and acceptance.     Plan Discussed with: CRNA and Surgeon  Anesthesia Plan Comments:         Anesthesia Quick Evaluation

## 2012-08-02 NOTE — Anesthesia Procedure Notes (Signed)
Procedure Name: Intubation Date/Time: 08/02/2012 10:56 AM Performed by: Fransisca Kaufmann Pre-anesthesia Checklist: Patient identified, Emergency Drugs available, Suction available, Patient being monitored and Timeout performed Patient Re-evaluated:Patient Re-evaluated prior to inductionOxygen Delivery Method: Circle system utilized Preoxygenation: Pre-oxygenation with 100% oxygen Intubation Type: IV induction Ventilation: Mask ventilation without difficulty Laryngoscope Size: Miller and 2 Grade View: Grade I Tube type: Oral Tube size: 7.5 mm Number of attempts: 1 Airway Equipment and Method: Stylet Placement Confirmation: ETT inserted through vocal cords under direct vision,  positive ETCO2 and breath sounds checked- equal and bilateral Secured at: 22 cm Tube secured with: Tape Dental Injury: Teeth and Oropharynx as per pre-operative assessment

## 2012-08-02 NOTE — ED Provider Notes (Signed)
Medical screening examination/treatment/procedure(s) were conducted as a shared visit with non-physician practitioner(s) and myself.  I personally evaluated the patient during the encounter.  Please see completed note.  Raeford Razor, MD 08/02/12 [phone]

## 2012-08-02 NOTE — Op Note (Signed)
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 Same  2. 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 Exploratory laparotomy 2. Aortobifemoral bypass graft (12 mm x 7 mm Dacron graft) 3. Bilateral femoral embolectomies 4. Bilateral intraoperative arteriograms  SURGEON: Di Kindle. Edilia Bo, MD, FACS  ASSIST: Della Goo PA  ANESTHESIA: Gen.   EBL: 400 cc  INDICATIONS: Diana Moreno is a 37 y.o. female foot developed back pain and paresthesias in the right foot on March 5. Aproximately 5 days ago she developed paresthesias in the left foot. She presented to the emergency department and a CT scan was obtained which showed thrombus in the infrarenal aorta and bilateral common iliac arteries. He was transferred to [facility] for vascular consultation. She had significant clot in her infrarenal aorta and this was not simply a saddle embolus. She had no good reason to have embolized. She has no history of arrhythmias or cardiac disease. CT scan did not show any atrial thrombus. In addition, the CT of the chest and abdomen and pelvis did not show any evidence of significant or obvious atherosclerotic disease which may have caused her aorta to occlude. I felt that most likely she had a hypercoagulable condition and a hypercoagulable panel was obtained before her heparin was started. Of note she does smoke a pack per day of cigarettes and has been smoking for approximately 20 years. She had persistent pain in the feet with no Doppler flow in the feet and a foot drop on the right which had been present. I felt that the only real option for revascularization with aortofemoral bypass grafting or possible aortic thrombectomy. I felt that there was too much thrombus within the aorta to attempt simple bilateral femoral  embolectomies.  FINDINGS:  1. The patient did have some inflammation around the iliac arteries and given her smoking history I felt that it was possible she may have some iliac disease which explained her aortic thrombosis. Therefore, I felt that aorto bifemoral bypass grafting would be the best option and not attempted aortic thrombectomy. 2. Intraoperative arteriogram on the left side showed severe tibial artery occlusive disease without reconstitution of any tibial arteries distally. There were some collaterals to suggest that this was chronic. 3. Intraoperative arteriogram on the right side also showed severe tibial artery occlusive disease with collaterals again suggesting this might be chronic. Based on the intraoperative findings I felt that Buerger's Disease should be in the differential diagnosis.   TECHNIQUE:  Patient was brought to the operative room and received a general anesthetic. The abdomen and both groins were prepped and draped in the usual sterile fashion. The patient had a central line placed an arterial line placed by anesthesia previously. A longitudinal incision was made in the right groin and through this incision the common femoral, superficial femoral, and deep femoral artery were dissected free and controlled with vessel loops. These vessels were soft. A separate longitudinal incision was made in the left groin again the common femoral, superficial femoral, and deep femoral arteries were controlled with vessel loops. These vessels also were soft without significant plaque.   The abdomen was entered through a midline incision. Upon careful exploration I inspected the liver gallbladder ovaries uterus large and small intestine no other significant intra-abdominal pathology was noted. The transverse colon was reflected superiorly and the small bowel reflected to  the right. The retroperitoneal tissue was divided allowing exposure of the infrarenal aorta up to the level of the left  renal vein. The artery here was dissected free circumferentially. The dissection was continued down to the aortic bifurcation. There was some inflammation around the iliac arteries. Given her history of tobacco use I felt that potentially she could have some iliac disease and that thrombosis of aorta related to this would be in the differential diagnosis. For this reason, I was reluctant to consider simple aortic thrombectomy and I felt that aortobifemoral bypass grafting would be her best chance for reperfusion without leaving potentially other problems. The patient was then heparinized. The common femoral arteries were clamped distally. I then controlled the infrarenal aorta with an aortic clamp. A longitudinal arteriotomy was made in the aorta above the level of the inferior mesenteric artery. I did retrieve a large amount of clot some of which was organized suggesting that this may be more chronic although the majority of it appeared to be fairly acute. The aorta here was oversewn with a 3-0 Prolene suture. The IMA appeared to be occluded. The infrarenal aorta with was divided circumferentially. A 12 x 7 mm Dacron graft was selected and cut the appropriate length leaving a short proximal trunk. It was sewn end to end to the infrarenal aorta using the felt cuff with continuous 3-0 Prolene suture. The anastomosis was tested and there was good hemostasis. It was then flushed and then the infrarenal aorta clamped above the anastomosis and the graft irrigated with heparinized saline.  I had previously created tunnels from the groin incisions to the infrarenal aorta. The right limb of the graft was brought through the tunnel. The superficial femoral artery and deep femoral arteries were controlled and a longitudinal arteriotomy was made. I passed a Fogarty catheter proximally up the right and some clot was retrieved. Then passed the catheter distally and it consistently stopped at approximately 50 cm suggesting some  chronic obstruction here. Some clot was retrieved distally. Also thrombectomized the deep femoral artery no clot was retrieved.. Made multiple passes down the superficial femoral artery until no further clot was retrieved. The right limb of the aortofemoral graft was then cut the appropriate length, spatulated, and sewn end-to-side to the common femoral artery using continuous 5-0 Prolene suture. Are to completing this anastomosis the arteries were backbled and flushed appropriately and the anastomosis completed the closure established the right leg which the patient tolerated from a hemodynamic standpoint.  Attention was turned to the left groin. The common femoral artery had been clamped. The superficial femoral and deep femoral arteries were clamped. A longitudinal arteriotomy was made in the common femoral artery again the Fogarty catheter was passed proximally and no clot was retrieved. The catheter was then passed distally down deep femoral artery and superficial femoral artery. I used both a 3 Fogarty and 4 Fogarty catheter on both sides. No clot was retrieved from the left deep femoral artery. Minimal clot was retrieved from the distal left superficial femoral artery. This catheter passed approximately 55 cm before meeting what appeared to be chronic obstruction. Once no further clot was retrieved the artery was flushed with heparinized saline. The left limb of the aortobifemoral bypass graft was cut to the appropriate length, spatulated and sewn into side to the common femoral artery using continuous 5-0 Prolene suture. Next completion arteriograms were obtained. Both films demonstrated severe tibial occlusive disease with no named tibial arteries distally. There were collaterals on both sides suggesting  a chronic nature to this disease. 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 I consider the option of performing popliteal exploration for tibial thrombectomy however I felt the chance of finding retrievable clot was very small and by making these incisions I could potentially interrupted is important collaterals and is worse in the perfusion to the foot. At this point I did have a monophasic PT signal on the right with the Doppler and a fairly reasonable anterior tibial signal on the right with the Doppler. On the left side I did have a PT signal with the Doppler. The signals were clearly not normal but there was flow on both sides. At this point I did not think popliteal exploration was indicated.    Given the intra-abdominal portion of the procedure I did elect to give 30 mg of protamine to partially reverse the heparin.  Hemostasis was obtained in the abdomen. There was some oozing from the suture line from the distal stump and therefore I placed a pledgeted suture line for control with good hemostasis. The inferior mesenteric artery appeared to have some chronic thrombus present, and was quite small. This was ligated and divided. The retroperitoneal tissue was closed with running 2-0 Vicryl. The abdominal contents were returned to their normal position and the fascial layer was closed with 2 #1 PDS sutures. The subcutaneous layer was closed with running 3-0 Vicryl and the skin closed with 4-0 Vicryl.  Next attention was turned to the right groin. Hemostasis was obtained the wound in the groin was closed with a deep layer of 2-0 Vicryl, subcutaneous layer of 3-0 Vicryl, and the skin closed with a 4-0 subcuticular stitch. On the left side, hemostasis was obtained in the wound. The deep layer was closed with a running 2-0 Vicryl. The subcutaneous layer was closed with 3-0 Vicryl and the skin closed with 4-0 Vicryl. Dermabond was applied. Patient tolerated the procedure well and was transferred to the recovery room in stable  condition. All needle and sponge counts were correct.  Waverly Ferrari, MD, FACS [facility]  DATE OF DICTATION:   08/02/2012

## 2012-08-03 ENCOUNTER — Encounter (HOSPITAL_COMMUNITY): Payer: Self-pay | Admitting: Vascular Surgery

## 2012-08-03 DIAGNOSIS — N2889 Other specified disorders of kidney and ureter: Secondary | ICD-10-CM

## 2012-08-03 DIAGNOSIS — D6859 Other primary thrombophilia: Secondary | ICD-10-CM

## 2012-08-03 DIAGNOSIS — I745 Embolism and thrombosis of iliac artery: Secondary | ICD-10-CM

## 2012-08-03 LAB — CBC
HCT: 27.9 % — ABNORMAL LOW (ref 36.0–46.0)
MCH: 28.7 pg (ref 26.0–34.0)
MCHC: 33.7 g/dL (ref 30.0–36.0)
MCV: 85.3 fL (ref 78.0–100.0)
Platelets: 165 10*3/uL (ref 150–400)
RDW: 15.9 % — ABNORMAL HIGH (ref 11.5–15.5)

## 2012-08-03 LAB — CARDIOLIPIN ANTIBODIES, IGG, IGM, IGA
Anticardiolipin IgA: 4 APL U/mL — ABNORMAL LOW (ref <22)
Anticardiolipin IgG: 4 GPL U/mL — ABNORMAL LOW (ref <23)
Anticardiolipin IgM: 7 MPL U/mL — ABNORMAL LOW (ref <11)

## 2012-08-03 LAB — AMYLASE: Amylase: 29 U/L (ref 0–105)

## 2012-08-03 LAB — COMPREHENSIVE METABOLIC PANEL
Albumin: 2.6 g/dL — ABNORMAL LOW (ref 3.5–5.2)
BUN: <3 mg/dL — ABNORMAL LOW (ref 6–23)
Calcium: 8.3 mg/dL — ABNORMAL LOW (ref 8.4–10.5)
Creatinine, Ser: 0.51 mg/dL (ref 0.50–1.10)
Total Bilirubin: 0.3 mg/dL (ref 0.3–1.2)
Total Protein: 5.2 g/dL — ABNORMAL LOW (ref 6.0–8.3)

## 2012-08-03 LAB — BETA-2-GLYCOPROTEIN I ABS, IGG/M/A: Beta-2-Glycoprotein I IgM: 18 M Units (ref <20)

## 2012-08-03 LAB — MAGNESIUM: Magnesium: 1.7 mg/dL (ref 1.5–2.5)

## 2012-08-03 MED ORDER — HEPARIN (PORCINE) IN NACL 100-0.45 UNIT/ML-% IJ SOLN
2500.0000 [IU]/h | INTRAMUSCULAR | Status: DC
  Administered 2012-08-03: 1200 [IU]/h via INTRAVENOUS
  Administered 2012-08-03: 1000 [IU]/h via INTRAVENOUS
  Administered 2012-08-03: 1200 [IU]/h via INTRAVENOUS
  Administered 2012-08-04: 1450 [IU]/h via INTRAVENOUS
  Administered 2012-08-05: 1750 [IU]/h via INTRAVENOUS
  Administered 2012-08-06 (×3): 2650 [IU]/h via INTRAVENOUS
  Administered 2012-08-07: 2500 [IU]/h via INTRAVENOUS
  Administered 2012-08-07: 2650 [IU]/h via INTRAVENOUS
  Administered 2012-08-07 – 2012-08-08 (×2): 2500 [IU]/h via INTRAVENOUS
  Filled 2012-08-03 (×13): qty 250

## 2012-08-03 MED ORDER — NALOXONE HCL 0.4 MG/ML IJ SOLN: 0.4000 mg | INTRAMUSCULAR | Status: DC | PRN

## 2012-08-03 MED ORDER — DIPHENHYDRAMINE HCL 50 MG/ML IJ SOLN: 12.5000 mg | Freq: Four times a day (QID) | INTRAMUSCULAR | Status: DC | PRN

## 2012-08-03 MED ORDER — POTASSIUM CHLORIDE 10 MEQ/50ML IV SOLN
10.0000 meq | Freq: Once | INTRAVENOUS | Status: AC
  Administered 2012-08-03: 10 meq via INTRAVENOUS

## 2012-08-03 MED ORDER — POTASSIUM CHLORIDE 10 MEQ/50ML IV SOLN: 10.0000 meq | INTRAVENOUS | Status: DC

## 2012-08-03 MED ORDER — POTASSIUM CHLORIDE 10 MEQ/50ML IV SOLN
INTRAVENOUS | Status: AC
  Filled 2012-08-03: qty 100

## 2012-08-03 MED ORDER — MORPHINE SULFATE (PF) 1 MG/ML IV SOLN
INTRAVENOUS | Status: DC
  Administered 2012-08-03: 14 mg via INTRAVENOUS
  Administered 2012-08-03: 3 mg via INTRAVENOUS
  Administered 2012-08-03 (×3): via INTRAVENOUS
  Administered 2012-08-04: 11 mg via INTRAVENOUS
  Administered 2012-08-04: 9 mg via INTRAVENOUS
  Filled 2012-08-03 (×5): qty 25

## 2012-08-03 MED ORDER — SODIUM CHLORIDE 0.9 % IJ SOLN: 9.0000 mL | INTRAMUSCULAR | Status: DC | PRN

## 2012-08-03 MED ORDER — DIPHENHYDRAMINE HCL 12.5 MG/5ML PO ELIX
12.5000 mg | ORAL_SOLUTION | Freq: Four times a day (QID) | ORAL | Status: DC | PRN
  Filled 2012-08-03: qty 5

## 2012-08-03 MED ORDER — HEPARIN BOLUS VIA INFUSION
2000.0000 [IU] | Freq: Once | INTRAVENOUS | Status: AC
  Administered 2012-08-03: 2000 [IU] via INTRAVENOUS
  Filled 2012-08-03: qty 2000

## 2012-08-03 MED ORDER — SODIUM CHLORIDE 0.9 % IJ SOLN
10.0000 mL | Freq: Two times a day (BID) | INTRAMUSCULAR | Status: DC
  Administered 2012-08-05 – 2012-08-09 (×3): 10 mL

## 2012-08-03 MED ORDER — SODIUM CHLORIDE 0.9 % IJ SOLN
10.0000 mL | INTRAMUSCULAR | Status: DC | PRN
  Administered 2012-08-03 – 2012-08-08 (×8): 10 mL
  Administered 2012-08-08: 20 mL
  Administered 2012-08-08: 10 mL
  Administered 2012-08-09 – 2012-08-10 (×3): 20 mL

## 2012-08-03 MED FILL — Heparin Sodium (Porcine) Inj 1000 Unit/ML: INTRAMUSCULAR | Qty: 30 | Status: AC

## 2012-08-03 MED FILL — Sodium Chloride Irrigation Soln 0.9%: Qty: 3000 | Status: AC

## 2012-08-03 MED FILL — Sodium Chloride IV Soln 0.9%: INTRAVENOUS | Qty: 1000 | Status: AC
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
## 2012-08-03 NOTE — Progress Notes (Signed)
OT Cancellation Note  Patient Details Name: Diana Moreno MRN: [id number] DOB: 08/22/1975   Cancelled Treatment:    Reason Eval/Treat Not Completed: Other (comment) (Pt fatigued. will see in am.)  [facility], OTR/L  [phone] 08/03/2012 08/03/2012, 6:00 PM

## 2012-08-03 NOTE — Progress Notes (Signed)
Patient ID: Diana Moreno, female   DOB: [birth year], 37 y.o.   MRN: [id number] Asked to see the patient due to concern for ischemic changes. She is a quite comfortable. She does have a 2+ popliteal pulses bilaterally. Her feet are quite warm and she does have good posterior tibial signals. I reassured the patient and her mother present. Continue current treatment.
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
## 2012-08-03 NOTE — Evaluation (Signed)
Physical Therapy Evaluation Patient Details Name: Diana Moreno MRN: [id number] DOB: December 19, 1975 Today's Date: 08/03/2012 Time: 1324-4010 PT Time Calculation (min): 49 min  PT Assessment / Plan / Recommendation Clinical Impression  37 yo adm with 2 week h/o RLE>LLE numbness and progressive weakness. 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 Pt with dependencies in mobility due to pain, weakness, and numbness of lower extremities. She will benefit from PT to incr her independence, safety, and determine DME needs as her d/c destination is clarified.    PT Assessment  Patient needs continued PT services    Follow Up Recommendations  Home health PT;Supervision for mobility/OOB    Does the patient have the potential to tolerate intense rehabilitation      Barriers to Discharge Decreased caregiver support      Equipment Recommendations  Rolling walker with 5" wheels (do not anticipate getting AFO in this venue)    Recommendations for Other Services OT consult   Frequency Min 4X/week    Precautions / Restrictions Precautions Precautions: Fall   Pertinent Vitals/Pain Rt foot 8/10 after ambulation; using PCA SaO2 on RA with walking down to 85%, yet with instruction in deep breathing, returned to 92% within 1 minute (?accuracy of machine)      Mobility  Bed Mobility Bed Mobility: Rolling Left;Left Sidelying to Sit;Sitting - Scoot to Edge of Bed Rolling Left: 3: Mod assist;With rail Left Sidelying to Sit: 4: Min assist;With rails Sitting - Scoot to [place] of Bed: 3: Mod assist Details for Bed Mobility Assistance: pt fearful to move due to anticipated pain; used PCA and allowed use of rails and HOB up 20 degrees Transfers Transfers: Sit to Stand;Stand to Sit Sit to Stand: 4: Min assist Stand to Sit: 4: Min assist;With upper extremity assist;With armrests;To chair/3-in-1 Details for Transfer Assistance: Pt with cues to  place feet under her knees better due to her inability to feel where her feet were very well; min steadying assist Ambulation/Gait Ambulation/Gait Assistance: 4: Min assist;Other (comment) (+2 for multiple lines (2 IV poles)) Ambulation Distance (Feet): 70 Feet Assistive device: Rolling walker Ambulation/Gait Assistance Details: steadying assist due to numbness in feet Gait Pattern: Decreased stride length;Right steppage    Exercises General Exercises - Lower Extremity Ankle Circles/Pumps: AROM;Both;10 reps;Supine   PT Diagnosis: Difficulty walking;Acute pain  PT Problem List: Decreased strength;Decreased activity tolerance;Decreased balance;Decreased mobility;Decreased knowledge of use of DME;Impaired sensation;Pain PT Treatment Interventions: DME instruction;Gait training;Stair training;Functional mobility training;Therapeutic activities;Patient/family education   PT Goals Acute Rehab PT Goals PT Goal Formulation: With patient Time For Goal Achievement: 08/10/12 Potential to Achieve Goals: Good Pt will Roll Supine to Left Side: with modified independence PT Goal: Rolling Supine to Left Side - Progress: Goal set today Pt will go Supine/Side to Sit: with modified independence PT Goal: Supine/Side to Sit - Progress: Goal set today Pt will go Sit to Supine/Side: with modified independence PT Goal: Sit to Supine/Side - Progress: Goal set today Pt will go Sit to Stand: with modified independence PT Goal: Sit to Stand - Progress: Goal set today Pt will go Stand to Sit: with modified independence PT Goal: Stand to Sit - Progress: Goal set today Pt will Ambulate: >150 feet;with supervision;with least restrictive assistive device PT Goal: Ambulate - Progress: Goal set today Pt will Perform Home Exercise Program: Independently (for strengthening and ROM to decr pain; bil LEs) PT Goal: Perform Home Exercise Program - Progress: Goal set today  Visit Information  Last PT Received On:  08/03/12 Assistance Needed: +2 (multiple lines)    Subjective Data  Subjective: Pt reported it felt good to get up and move (although she had been anxious/fearful to do so) Patient Stated Goal: get her strength back in her legs   Prior Functioning  Home Living Lives With: Friend(s) (roommate) Available Help at Discharge: Family;Friend(s);Available PRN/intermittently Additional Comments: pt's roommate is having her evicted; not sure where she's going to stay Prior Function Level of Independence: Independent Able to Take Stairs?: Reciprically 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; 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 ~2 weeks ago, has been unable to work and roommate has begun eviction process Communication Communication: No difficulties    Cognition  Cognition Overall Cognitive Status: Appears within functional limits for tasks assessed/performed Arousal/Alertness: Awake/alert Orientation Level: Appears intact for tasks assessed Behavior During Session: [facility] for tasks performed    Extremity/Trunk Assessment Right Upper Extremity Assessment RUE ROM/Strength/Tone: [facility] for tasks assessed Left Upper Extremity Assessment LUE ROM/Strength/Tone: WFL for tasks assessed Right Lower Extremity Assessment RLE ROM/Strength/Tone: Deficits RLE ROM/Strength/Tone Deficits: supine-hip flexion 2+, dorsiflexion 3- RLE Sensation: Deficits RLE Sensation Deficits: numbenss throughout foot, can feel the floor somewhat RLE Coordination: WFL - gross motor Left Lower Extremity Assessment LLE ROM/Strength/Tone: Deficits LLE ROM/Strength/Tone Deficits: supine; hip flexion 3, dorsiflexion 3 LLE Sensation: Deficits LLE Sensation Deficits: foot numb, can feel floor  LLE Coordination: WFL - gross motor Trunk Assessment Trunk Assessment: Normal   Balance    End of Session PT - End of Session Equipment Utilized During Treatment: Gait belt Activity Tolerance: Patient limited by  pain Patient left: in chair;with call bell/phone within reach Nurse Communication: Mobility status  GP     Diana Moreno 08/03/2012, 3:55 PM Pager [phone]

## 2012-08-03 NOTE — Progress Notes (Addendum)
VASCULAR PROGRESS NOTE  SUBJECTIVE: Pain adequately controlled. No nausea.  PHYSICAL EXAM: Filed Vitals:   08/03/12 0500 08/03/12 0600 08/03/12 0630 08/03/12 0700  BP: 99/61 100/67  109/65  Pulse: 98 98 100 104  Temp:      TempSrc:      Resp: 23 22 23 16   Height:      Weight:      SpO2: 99% 100% 100% 100%   NGT- 100 cc overnight  Lungs: clear Abdomen: dressings dry, some bowel sounds Feet warm: doppler PT bilat. No significant calf swelling.   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 The patient has been counseled on the importance of tobacco cessation and the Tobacco Cessation Team has been consulted.  * GU: Leave foley for now to monitor urine output which has been borderline.  * ID: D/C Zinacef after second perioperative dose.  * Hypercoagulable workup pending. I have a strong suspicion that she has a hypercoagulable issue. Labs were sent before her heparin was started.  So far:  Sed rate = 27 (elevated) C reactive protein (high) AT III = 96 (normal)  Consult hematology and start IV heparin (no bolus)  * Cardiac: Pt will need a TEE to rule out a cardiac source for embolization. Spoke with Dr. Daleen Squibb who will arrange.  * DVT prophylaxis: change lovenox to IV heparin  ** Disposition: Anticipated LOS 4-5 days. Will likely go home when discharged.   Waverly Ferrari, MD, FACS Beeper [phone] 08/03/2012
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
## 2012-08-03 NOTE — Progress Notes (Signed)
08/03/2012 1700 Nursing note RN called to pt. Room pt. And daughter c/o worsening pain in left foot and concerns for possible worsening of coloring of left great toe. Pulses found via doppler at Right DP and PT and on Left DP. Feet warm to touch bilaterally. There was difficulty finding Left PT pulse with doppler. Arlan Organ [facility] paged and made aware. No orders received at this time except to continue to monitor patient and pulse/color of left foot. At 1730, RN called back to room, pt. States that pain and cramping in left foot is worse. No change in assessment of foot. Arlan Organ [facility] paged and made aware. Dr. Arbie Cookey to see patient this evening. Pt. Updated on plans. Will continue to closely monitor patient.  Kaynan Klonowski, Blanchard Kelch

## 2012-08-04 ENCOUNTER — Inpatient Hospital Stay (HOSPITAL_COMMUNITY): Payer: Worker's Compensation

## 2012-08-04 DIAGNOSIS — R0602 Shortness of breath: Secondary | ICD-10-CM

## 2012-08-04 LAB — CBC
Hemoglobin: 8.6 g/dL — ABNORMAL LOW (ref 12.0–15.0)
MCH: 29.2 pg (ref 26.0–34.0)
MCV: 86.8 fL (ref 78.0–100.0)
Platelets: 208 10*3/uL (ref 150–400)
RBC: 2.95 MIL/uL — ABNORMAL LOW (ref 3.87–5.11)
WBC: 13.4 10*3/uL — ABNORMAL HIGH (ref 4.0–10.5)

## 2012-08-04 LAB — PROTEIN C, TOTAL: Protein C, Total: 87 % (ref 72–160)

## 2012-08-04 LAB — LUPUS ANTICOAGULANT PANEL
DRVVT: 43.6 s — ABNORMAL HIGH
Lupus Anticoagulant: NOT DETECTED
PTT Lupus Anticoagulant: 39.2 s (ref 28.0–43.0)
dRVVT Incubated 1:1 Mix: 38.1 s

## 2012-08-04 LAB — PROTEIN S, TOTAL: Protein S Ag, Total: 103 % (ref 60–150)

## 2012-08-04 LAB — PROTEIN S ACTIVITY: Protein S Activity: 103 % (ref 69–129)

## 2012-08-04 LAB — HEPARIN LEVEL (UNFRACTIONATED)
Heparin Unfractionated: <0.1 IU/mL — ABNORMAL LOW (ref 0.30–0.70)
Heparin Unfractionated: <0.1 IU/mL — ABNORMAL LOW (ref 0.30–0.70)

## 2012-08-04 LAB — FACTOR 5 LEIDEN

## 2012-08-04 LAB — HEMOGLOBIN AND HEMATOCRIT, BLOOD: HCT: 26.4 % — ABNORMAL LOW (ref 36.0–46.0)

## 2012-08-04 MED ORDER — MORPHINE SULFATE 2 MG/ML IJ SOLN
2.0000 mg | INTRAMUSCULAR | Status: DC | PRN
  Administered 2012-08-04 – 2012-08-10 (×16): 2 mg via INTRAVENOUS
  Filled 2012-08-04 (×16): qty 1

## 2012-08-04 MED ORDER — BISACODYL 10 MG RE SUPP: 10.0000 mg | Freq: Once | RECTAL | Status: DC

## 2012-08-04 MED ORDER — HEPARIN BOLUS VIA INFUSION
3000.0000 [IU] | Freq: Once | INTRAVENOUS | Status: AC
  Administered 2012-08-04: 3000 [IU] via INTRAVENOUS
  Filled 2012-08-04: qty 3000

## 2012-08-04 MED ORDER — FUROSEMIDE 10 MG/ML IJ SOLN: 20.0000 mg | Freq: Once | INTRAMUSCULAR | Status: AC

## 2012-08-04 MED ORDER — FUROSEMIDE 10 MG/ML IJ SOLN
INTRAMUSCULAR | Status: AC
  Administered 2012-08-04: 20 mg via INTRAVENOUS
  Filled 2012-08-04: qty 4

## 2012-08-04 MED ORDER — IOHEXOL 350 MG/ML SOLN
100.0000 mL | Freq: Once | INTRAVENOUS | Status: AC | PRN
  Administered 2012-08-04: 100 mL via INTRAVENOUS

## 2012-08-04 NOTE — Progress Notes (Signed)
Called to room to check patient Sats and PCA pump. Patients sats upon entering room in low 80's on pump. Placed on our Sat probe and Patients sats matched with HR the same at 80% and 112hr. Patient didn't seem to be in any distress BBS heard throughout with rhonchi and Diminished in the bases Significantly. Did cough and deep breathing with patient as well as IS. Patient made great efforts on both and coughed up a nice amount of sputum, brown/yellow in color. Patent pulled up in bed and worked with some more. Patient on 5l [place] at this time and is a Mouth breather, Placed patient on Venti Mask at 50% for the night as she sleeps to help with O2 sats and was explained she can be put back on [place] in the AM after she wakes up if able to tolerate at that time. RN aware and Patient is very comfortable post. All PCA guides are being met and reading well. Will reevaluate if needed at a later time.
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
## 2012-08-04 NOTE — Progress Notes (Signed)
Spoke with MD on call about pt's O2 status with venti mask and MD verbalized that the method would be fine and to just modify the order for a venti mask at night to help pt sleep better being that the pt snores and is a mouth breather. Pt is tolerating mask well and verbalizes that she feels much better. Pt's O2 sats are now in the upper 90s, pt also verbalized that she would only like to wear this mask at night and that she would like to go back to the nasal nanula in the morning. Will continue to monitor.  Caddie Randle M

## 2012-08-04 NOTE — Progress Notes (Signed)
Physical Therapy Treatment Patient Details Name: Diana Moreno MRN: [id number] DOB: 04/07/1976 Today's Date: 08/04/2012 Time: 4098-1191 PT Time Calculation (min): 45 min  PT Assessment / Plan / Recommendation Comments on Treatment Session  Pt s/p aortobifemoral BPG with improved mobility today.  Pt progressing with ambulation with RW.  Increased time for session  secondary to co-treat with OT with pt performing bath, ambulation, hair washing and changing bed of linens.  Pt got back into bed secondary to she states the chair is uncomfortable.      Follow Up Recommendations  Home health PT;Supervision for mobility/OOB                 Equipment Recommendations  Rolling walker with 5" wheels    Recommendations for Other Services OT consult  Frequency Min 4X/week   Plan Discharge plan remains appropriate;Frequency remains appropriate    Precautions / Restrictions Precautions Precautions: Fall Precaution Comments: multiple lines Restrictions Weight Bearing Restrictions: No   Pertinent Vitals/Pain VSS, Some pain abdomen    Mobility  Bed Mobility Bed Mobility: Rolling Left;Left Sidelying to Sit;Sitting - Scoot to [place] of Bed Rolling Left: 4: Min assist Left Sidelying to Sit: 4: Min assist Supine to Sit: 4: Min assist Sitting - Scoot to [place] of Bed: 4: Min assist Sit to Supine: 4: Min assist Details for Bed Mobility Assistance: Pt needed min assist to elevate trunk for getting OOB and min assist to assist LEs back into bed after ambulation.   Transfers Transfers: Sit to Stand;Stand to Sit Sit to Stand: 4: Min assist;From bed Stand to Sit: 4: Min assist;With upper extremity assist;To bed Details for Transfer Assistance: Pt reports she cannot feel where her feet were very well but did manage to don 1 sock.  Needs  min steadying assist upon initial stand to RW.  Stood at sink for at least 10 minutes to bathe with min guard assist.  Nursing notified that pt started her menstrual  cycle and place pads on bed accordingly.  See OT note for details re the bath and shampoo.   Ambulation/Gait Ambulation/Gait Assistance: Other (comment);4: Min guard (+2 for multiple lines - 2 IV poles and O2) Ambulation Distance (Feet): 125 Feet Assistive device: Rolling walker Ambulation/Gait Assistance Details: Ambulating with RW with steady gait.   Gait Pattern: Decreased stride length;Right steppage Stairs: No Wheelchair Mobility Wheelchair Mobility: No    PT Goals Acute Rehab PT Goals PT Goal: Supine/Side to Sit - Progress: Progressing toward goal PT Goal: Sit to Supine/Side - Progress: Progressing toward goal PT Goal: Sit to Stand - Progress: Progressing toward goal PT Goal: Stand to Sit - Progress: Progressing toward goal PT Goal: Ambulate - Progress: Progressing toward goal  Visit Information  Last PT Received On: 08/04/12 Assistance Needed: +2 (for 2 IV poles and O2) PT/OT Co-Evaluation/Treatment: Yes    Subjective Data  Subjective: "You guys know how to take care of someone."   Cognition  Cognition Overall Cognitive Status: Appears within functional limits for tasks assessed/performed Arousal/Alertness: Awake/alert Orientation Level: Appears intact for tasks assessed Behavior During Session: [facility] for tasks performed    Balance  Static Standing Balance Static Standing - Balance Support: Bilateral upper extremity supported;During functional activity Static Standing - Level of Assistance: 4: Min assist Static Standing - Comment/# of Minutes: Guard assist needed to stand at sink and perform ADL.  Pt doing much better today with endurance and balance.    End of Session PT - End of Session Equipment Utilized During Treatment:  Gait belt;Oxygen Activity Tolerance: Patient limited by pain Patient left: in bed;with call bell/phone within reach Nurse Communication: Mobility status       INGOLD,Valen Gillison 08/04/2012, 11:26 AM  [facility] Acute  Rehabilitation [phone] (pager)
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
## 2012-08-04 NOTE — Progress Notes (Signed)
0000 pt received 15.93 mg of morphine from PCA pump.  \ Chloe Miyoshi M

## 2012-08-04 NOTE — Progress Notes (Addendum)
[facility] [place]  Post-op  Intra-abdominal Surgery note  Date of Surgery: 08/01/2012 - 08/02/2012  Surgeon(s): Chuck Hint, MD  2 Days Post-Op Procedure(s): AORTA BIFEMORAL BYPASS GRAFT with bilateral femoral embolectomies and intraoperative arteriogram  History of Present Illness  Diana Moreno is a 37 y.o. female who is  up s/p Procedure(s): AORTA BIFEMORAL BYPASS GRAFT with bilateral femoral embolectomies and intraoperative arteriogram Pt is doing well. denies incisional pain; denies nausea/vomiting; denies diarrhea. has not had flatus;has not had BM. Pt belching  Significant Diagnostic Studies: CBC Lab Results  Component Value Date   WBC 13.4* 08/04/2012   HGB 8.6* 08/04/2012   HCT 25.6* 08/04/2012   MCV 86.8 08/04/2012   PLT 208 08/04/2012    BMET    Component Value Date/Time   NA 135 08/03/2012 0345   K 3.6 08/03/2012 0345   CL 105 08/03/2012 0345   CO2 23 08/03/2012 0345   GLUCOSE 146* 08/03/2012 0345   BUN <3* 08/03/2012 0345   CREATININE 0.51 08/03/2012 0345   CALCIUM 8.3* 08/03/2012 0345   GFRNONAA >90 08/03/2012 0345   GFRAA >90 08/03/2012 0345    COAG Lab Results  Component Value Date   INR 1.09 08/02/2012   INR 1.00 08/02/2012   No results found for this basename: PTT    I/O last 3 completed shifts: In: 2223.2 [I.V.:1883.2; Other:10; NG/GT:180; IV Piggyback:150] Out: 1285 [Urine:1185; Emesis/NG output:100]    Physical Examination Filed Vitals:   08/04/12 0109 08/04/12 0348 08/04/12 0450 08/04/12 0731  BP: 91/59  110/76 108/73  Pulse: 109  104 112  Temp:   99.7 F (37.6 C) 98.7 F (37.1 C)  TempSrc:   Oral Oral  Resp: 20 23 24 22   Height:      Weight:      SpO2: 87% 95% 96% 88%    General: A&O x 3, WDWN female in NAD Pulmonary: normal non-labored breathing , without Rales, rhonchi,  wheezing Cardiac: Heart rate : regular ,  Abdomen:abdomen soft, non-tender and no BS yet Abdominal wound:clean, dry,  intact  Neurologic: A&O X 3; Appropriate Affect ;  SENSATION: normal;  MOTOR FUNCTION:  moving all extremities equally.  Speech is fluent/normal  Vascular Exam:BLE warm and well perfused Extremities without ischemic changes, no Gangrene, no cellulitis; no open wounds;   LOWER EXTREMITY PULSE     RIGHT                       Left      POSTERIOR TIBIAL  monophasic by Doppler  monophasic by Doppler       DORSALIS PEDIS      ANTERIOR TIBIAL  monophasic by Doppler   absent       PERONEAL  Biphasic by doppler    Non-Invasive Vascular Imaging ABI'S: pend  Assessment/Plan: Diana Moreno is a 37 y.o. female who is 2 Days Post-Op Procedure(s): AORTA BIFEMORAL BYPASS GRAFT with bilateral femoral embolectomies and intraoperative arteriogram DC Foley DC PCA Dulcolax supp for post-op ileus Post-op acute blood loss anemia - stable ambulate   Marlowe Shores [phone] 08/04/2012 8:50 AM  Addendum  I have independently interviewed and examined the patient, and I agree with the physician assistant's findings.  Pt desat. to 85% on [place] oxygen.  BLE venous duplex ordered to evaluate for PE.  Chest CXR ordered to check for pulm. Edema.  Pt already on Heparin gtt.  Hold on diet for now as awaiting return of bowel  function.  Left foot with cyanotic great toe.  Both feet warm.  - Check Venous duplex BLE - Cont Heparin gtt - Check Chest CXR - Hold on transfusion for now - Check H/H at noon  Leonides Sake, MD [facility] [place] Office: [phone] Pager: [phone]  08/04/2012, 10:15 AM  Addendum  Haziness in left lower lung c/w pulmonary edema vs consolidation.  Will give 20 mg Lasix and work on pulm toilet with IS x 10 q1.  May need to watch hemodynamics, as if pt drops her BP likely intravascular volume depletion.  Leonides Sake, MD [facility] [place] Office: [phone] Pager: [phone]  08/04/2012, 10:49 AM

## 2012-08-04 NOTE — Progress Notes (Signed)
*  Preliminary Results* Bilateral lower extremity venous duplex completed. Visualized veins of bilateral lower extremities are negative for deep vein thrombosis. No evidence of Baker's cyst bilaterally.  08/04/2012 5:45 PM Gertie Fey, RDMS, RDCS

## 2012-08-04 NOTE — Evaluation (Signed)
Occupational Therapy Evaluation Patient Details Name: Diana Moreno MRN: [id number] DOB: May 20, 1976 Today's Date: 08/04/2012 Time: 4098-1191 OT Time Calculation (min): 39 min  OT Assessment / Plan / Recommendation Clinical Impression  Pt admitted for aorta bifemoral bypass graft.  Continues to have numbness in LEs and pain as expected, but is mobilizing well.  Requires 2 people due to multiple lines.  Expect pt will be able to discharge home.  Will follow acutely.    OT Assessment  Patient needs continued OT Services    Follow Up Recommendations  Home health OT    Barriers to Discharge Decreased caregiver support unclear of discharge environment  Equipment Recommendations  3 in 1 bedside comode    Recommendations for Other Services    Frequency  Min 2X/week    Precautions / Restrictions Precautions Precautions: Fall Precaution Comments: multiple lines Restrictions Weight Bearing Restrictions: No   Pertinent Vitals/Pain Abdomen, did not rate, PCA, repositioned    ADL  Eating/Feeding: NPO Grooming: Wash/dry hands;Wash/dry face;Teeth care;Brushing hair;Supervision/safety Where Assessed - Grooming: Unsupported standing;Unsupported sitting Upper Body Bathing: Minimal assistance Where Assessed - Upper Body Bathing: Unsupported standing Lower Body Bathing: Maximal assistance Where Assessed - Lower Body Bathing: Supported standing Upper Body Dressing: Supervision/safety Where Assessed - Upper Body Dressing: Unsupported standing Lower Body Dressing: Moderate assistance Where Assessed - Lower Body Dressing: Unsupported sitting Equipment Used: Rolling walker Transfers/Ambulation Related to ADLs: min guard assist with second person to manage lines with RW. ADL Comments: Pt limited by abdominal pain when attempting to donn socks.  Stood x 10 minutes at sink for bathing and dressing, sat to complete grooming.    OT Diagnosis: Generalized weakness;Acute pain  OT Problem List:  Decreased strength;Decreased activity tolerance;Impaired balance (sitting and/or standing);Decreased knowledge of use of DME or AE;Pain OT Treatment Interventions: Self-care/ADL training;DME and/or AE instruction;Patient/family education   OT Goals Acute Rehab OT Goals OT Goal Formulation: With patient Time For Goal Achievement: 08/18/12 Potential to Achieve Goals: Good ADL Goals Pt Will Perform Grooming: with modified independence;Standing at sink ADL Goal: Grooming - Progress: Goal set today Pt Will Perform Upper Body Bathing: with modified independence;Standing at sink ADL Goal: Upper Body Bathing - Progress: Goal set today Pt Will Perform Lower Body Bathing: with modified independence;Standing at sink;Sitting at sink ADL Goal: Lower Body Bathing - Progress: Goal set today Pt Will Perform Upper Body Dressing: with set-up;Sitting, bed ADL Goal: Upper Body Dressing - Progress: Goal set today Pt Will Perform Lower Body Dressing: with modified independence;Sitting, bed (crossing foot over opposite knee) ADL Goal: Lower Body Dressing - Progress: Goal set today Pt Will Transfer to Toilet: with modified independence;Ambulation;3-in-1;with DME ADL Goal: Toilet Transfer - Progress: Goal set today Pt Will Perform Toileting - Clothing Manipulation: with modified independence;Standing ADL Goal: Toileting - Clothing Manipulation - Progress: Goal set today Pt Will Perform Toileting - Hygiene: Independently;Sitting on 3-in-1 or toilet ADL Goal: Toileting - Hygiene - Progress: Goal set today Miscellaneous OT Goals Miscellaneous OT Goal #1: Pt will perform bed mobility using log roll technique independently in prep for ADL at EOB. OT Goal: Miscellaneous Goal #1 - Progress: Goal set today  Visit Information  Last OT Received On: 08/04/12 Assistance Needed: +2 (for 2 IV poles and O2) PT/OT Co-Evaluation/Treatment: Yes    Subjective Data  Subjective: "You are now my best friend." Patient Stated  Goal: Return to PLOF.   Prior Functioning     Home Living Lives With: Friend(s) Available Help at Discharge: Family;Friend(s);Available PRN/intermittently Additional Comments:  pt's roommate is having her evicted; not sure where she's going to stay Prior Function Level of Independence: Independent Able to Take Stairs?: Reciprically Vocation: Part time employment Communication Communication: No difficulties Dominant Hand: Right         Vision/Perception     Cognition  Cognition Overall Cognitive Status: Appears within functional limits for tasks assessed/performed Arousal/Alertness: Awake/alert Orientation Level: Appears intact for tasks assessed Behavior During Session: [facility] for tasks performed    Extremity/Trunk Assessment Right Upper Extremity Assessment RUE ROM/Strength/Tone: WFL for tasks assessed RUE Coordination: WFL - gross/fine motor Left Upper Extremity Assessment LUE ROM/Strength/Tone: WFL for tasks assessed LUE Coordination: WFL - gross/fine motor Trunk Assessment Trunk Assessment: Normal     Mobility Bed Mobility Bed Mobility: Supine to Sit;Sitting - Scoot to Edge of Bed;Sit to Supine Supine to Sit: 4: Min guard;With rails;HOB elevated Sitting - Scoot to Edge of Bed: 4: Min guard;With rail Sit to Supine: 4: Min assist;HOB flat (for legs) Transfers Transfers: Sit to Stand;Stand to Sit Sit to Stand: 4: Min guard;With upper extremity assist;From bed Stand to Sit: 4: Min guard;With upper extremity assist;To bed     Exercise     Balance     End of Session OT - End of Session Activity Tolerance: Patient limited by fatigue;Patient limited by pain Patient left: in bed;with call bell/phone within reach;with nursing in room Nurse Communication: Mobility status  GO     Evern Bio 08/04/2012, 11:17 AM [phone]

## 2012-08-05 LAB — HEPARIN LEVEL (UNFRACTIONATED)
Heparin Unfractionated: 0.14 IU/mL — ABNORMAL LOW (ref 0.30–0.70)
Heparin Unfractionated: 0.39 IU/mL (ref 0.30–0.70)

## 2012-08-05 LAB — BASIC METABOLIC PANEL
CO2: 23 mEq/L (ref 19–32)
GFR calc non Af Amer: >90 mL/min (ref >90)
Glucose, Bld: 111 mg/dL — ABNORMAL HIGH (ref 70–99)
Potassium: 3.4 mEq/L — ABNORMAL LOW (ref 3.5–5.1)
Sodium: 142 mEq/L (ref 135–145)

## 2012-08-05 LAB — ANTITHROMBIN III: AntiThromb III Func: 49 % — ABNORMAL LOW (ref 75–120)

## 2012-08-05 LAB — CBC
Hemoglobin: 8.7 g/dL — ABNORMAL LOW (ref 12.0–15.0)
RBC: 3.02 MIL/uL — ABNORMAL LOW (ref 3.87–5.11)
WBC: 10.3 10*3/uL (ref 4.0–10.5)

## 2012-08-05 MED ORDER — HEPARIN BOLUS VIA INFUSION
2000.0000 [IU] | Freq: Once | INTRAVENOUS | Status: AC
  Administered 2012-08-05: 2000 [IU] via INTRAVENOUS
  Filled 2012-08-05: qty 2000

## 2012-08-05 NOTE — Progress Notes (Signed)
Occupational Therapy Treatment Patient Details Name: Diana Moreno MRN: [id number] DOB: 08/23/1975 Today's Date: 08/05/2012 Time: 4098-1191 OT Time Calculation (min): 35 min  OT Assessment / Plan / Recommendation    Follow Up Recommendations  Home health OT       Equipment Recommendations  3 in 1 bedside comode       Frequency Min 2X/week   Plan Discharge plan remains appropriate    Precautions / Restrictions Precautions Precautions: Fall Precaution Comments: multiple lines       ADL  Lower Body Dressing: Performed Where Assessed - Lower Body Dressing: Supported sit to Pharmacist, hospital: Performed;Minimal Dentist Method: Sit to Barista: Materials engineer and Hygiene: Performed;Minimal assistance Where Assessed - Engineer, mining and Hygiene: Standing Transfers/Ambulation Related to ADLs: Pt agreeable to OOB.  Pt anxious regarding Dc plan as her room mate has possibly evicted her.  Care Manager came in during OT session.        OT Goals ADL Goals ADL Goal: Upper Body Dressing - Progress: Progressing toward goals ADL Goal: Toilet Transfer - Progress: Progressing toward goals ADL Goal: Toileting - Clothing Manipulation - Progress: Progressing toward goals ADL Goal: Toileting - Hygiene - Progress: Progressing toward goals  Visit Information  Last OT Received On: 08/05/12    Subjective Data  Subjective: I do want to get on the commode      Cognition  Cognition Overall Cognitive Status: Appears within functional limits for tasks assessed/performed Arousal/Alertness: Awake/alert Orientation Level: Appears intact for tasks assessed Behavior During Session: [facility] for tasks performed    Mobility  Bed Mobility Bed Mobility: Rolling Left;Left Sidelying to Sit;Sitting - Scoot to [place] of Bed Rolling Left: 4: Min guard Left Sidelying to Sit: 4: Min guard Supine to Sit: 4: Min  assist;4: Min guard Sitting - Scoot to [place] of Bed: 4: Min guard Sit to Supine: 4: Min guard Details for Bed Mobility Assistance: Pt needed min assist to elevate trunk for getting OOB and min assist to assist LEs back into bed after ambulation.   Transfers Sit to Stand: From bed;4: Min guard;From toilet Stand to Sit: 4: Min assist;With upper extremity assist;To bed;4: Min guard;To toilet;To chair/3-in-1          End of Session OT - End of Session Activity Tolerance: Patient tolerated treatment well Patient left: in chair;with call bell/phone within reach       [facility], Karin Golden D 08/05/2012, 10:55 AM

## 2012-08-05 NOTE — Progress Notes (Signed)
Hypercoagulable panel results reviewed. No significant abnormalities to explain her thromboembolic event. Cardiac evaluation is recommended to R/O cardiac source. Anticoagulation for 6-12 months should be sufficient unless the patient has other abnormality to require life long anticoagulation.
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
## 2012-08-05 NOTE — Progress Notes (Signed)
VASCULAR PROGRESS NOTE   SUBJECTIVE: Mild pain left foot and left great toe. Some flatus. No BM.   PHYSICAL EXAM: Filed Vitals:   08/04/12 1109 08/04/12 1321 08/04/12 1954 08/05/12 0345  BP:  112/70 118/81 104/77  Pulse:  100 101 116  Temp:  98.5 F (36.9 C) 98.4 F (36.9 C) 97.9 F (36.6 C)  TempSrc:  Oral Oral Oral  Resp:  20 20 22   Height:      Weight:      SpO2: 95% 95% 100% 98%   Doppler right ATA Doppler left PT  Feet appear adequately perfused. Distal aspect of left great toe is mottled. Abd: hypoactive bowel sounds. Incisions look fine.  LABS: Lab Results  Component Value Date   WBC 10.3 08/05/2012   HGB 8.7* 08/05/2012   HCT 26.0* 08/05/2012   MCV 86.1 08/05/2012   PLT 211 08/05/2012   Lab Results  Component Value Date   CREATININE 0.52 08/05/2012   Lab Results  Component Value Date   INR 1.09 08/02/2012   CT Chest: No evidence of PE    ASSESSMENT AND PLAN: 1. 3 Days Post-Op s/p: AFBG 2. Hematology: Hypercoag workup in progress. Appreciate Dr. Asa Lente help. 3. Cardiac: Needs TEE. I had spoken with Dr. Daleen Squibb who is going to arrange. 4. Continue IV heparin. Will need coumadin when taking po 5. D/C foley 6. Ambulate. Appreciate PTx's help. 7. Start clear liquids. 8. Tobacco cessation: consult 9. Bilat Atx: continue IS  Cari Caraway Beeper: [phone] 08/05/2012

## 2012-08-05 NOTE — Progress Notes (Signed)
PT Cancellation Note  Patient Details Name: Diana Moreno MRN: [id number] DOB: 11-15-1975   Cancelled Treatment:    Reason Eval/Treat Not Completed: Fatigue/lethargy limiting ability to participate;Other (comment) (pt just back to bed with nursing)   INGOLD,Alann Avey 08/05/2012, 11:32 AM [facility] [phone] (pager)

## 2012-08-05 NOTE — Evaluation (Signed)
Physical Therapy Evaluation Patient Details Name: Diana Moreno MRN: [id number] DOB: May 17, 1976 Today's Date: 08/05/2012 Time: 4098-1191 PT Time Calculation (min): 26 min  PT Assessment / Plan / Recommendation Clinical Impression       PT Assessment       Follow Up Recommendations  Home health PT;Supervision for mobility/OOB    Does the patient have the potential to tolerate intense rehabilitation      Barriers to Discharge        Equipment Recommendations  Rolling walker with 5" wheels    Recommendations for Other Services     Frequency Min 4X/week    Precautions / Restrictions Precautions Precautions: Fall Precaution Comments: multiple lines, 2 Iv poles and portable O2 Restrictions Weight Bearing Restrictions: No   Pertinent Vitals/Pain      Mobility  Bed Mobility Bed Mobility: Rolling Left;Rolling Right;Supine to Sit;Sitting - Scoot to [place] of Bed;Sit to Supine Rolling Right: 5: Supervision Rolling Left: 5: Supervision Supine to Sit: 5: Supervision Sitting - Scoot to Edge of Bed: 5: Supervision Sit to Supine: 5: Supervision Details for Bed Mobility Assistance: no assist needed just extra time. Transfers Transfers: Sit to Stand;Stand to Sit Sit to Stand: 5: Supervision;With upper extremity assist;From bed;From chair/3-in-1;From toilet Stand to Sit: 5: Supervision;With upper extremity assist;To toilet;To chair/3-in-1;To bed Ambulation/Gait Ambulation/Gait Assistance: 5: Supervision Ambulation Distance (Feet): 250 Feet Assistive device: Rolling walker Ambulation/Gait Assistance Details: Gait improving with mild unsteady on occaisionl  Improved foot drop to mild foot slap.  Better heel toe gait. Gait Pattern: Step-through pattern;Decreased stride length (mild steppage on R) Gait velocity: prefers slower speed due to pain in her feet. Stairs: No    Exercises     PT Diagnosis:    PT Problem List:   PT Treatment Interventions:     PT Goals Acute Rehab PT  Goals Time For Goal Achievement: 08/10/12 Potential to Achieve Goals: Good Pt will go Supine/Side to Sit: with modified independence PT Goal: Supine/Side to Sit - Progress: Progressing toward goal Pt will go Sit to Supine/Side: with modified independence PT Goal: Sit to Supine/Side - Progress: Progressing toward goal Pt will go Sit to Stand: with modified independence PT Goal: Sit to Stand - Progress: Progressing toward goal Pt will go Stand to Sit: with modified independence PT Goal: Stand to Sit - Progress: Progressing toward goal Pt will Ambulate: >150 feet;with supervision;with least restrictive assistive device PT Goal: Ambulate - Progress: Progressing toward goal Pt will Perform Home Exercise Program: Independently PT Goal: Perform Home Exercise Program - Progress: Progressing toward goal  Visit Information  Last PT Received On: 08/05/12 Assistance Needed: +2    Subjective Data  Subjective: I was sitting so long in that chair with my feet down they started hurting, then I didn't want to go walk with Dawn   Prior Functioning       Cognition  Cognition Overall Cognitive Status: Appears within functional limits for tasks assessed/performed Arousal/Alertness: Awake/alert Orientation Level: Appears intact for tasks assessed Behavior During Session: [facility] for tasks performed    Extremity/Trunk Assessment     Balance Balance Balance Assessed: Yes Static Standing Balance Static Standing - Balance Support: During functional activity;Left upper extremity supported;Right upper extremity supported Static Standing - Level of Assistance: 5: Stand by assistance  End of Session    GP     [name], Eliseo Gum 08/05/2012, 4:25 PM 08/05/2012  [place] Bing, PT [phone] (pager)

## 2012-08-05 NOTE — Progress Notes (Signed)
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  Patient Measurements: Height: 5\' 5"  (165.1 cm) Weight: 151 lb 1.6 oz (68.539 kg) IBW/kg (Calculated) : 57  Vital Signs: Temp: 98 F (36.7 C) (03/19 2014) Temp src: Oral (03/19 2014) BP: 110/75 mmHg (03/19 2014) Pulse Rate: 105 (03/19 2014)  Labs:  Recent Labs  08/03/12 0345  08/04/12 0610  08/04/12 1113  08/05/12 0444 08/05/12 1230 08/05/12 2132  HGB 9.4*  --  8.6*  --  8.9*  --  8.7*  --   --   HCT 27.9*  --  25.6*  --  26.4*  --  26.0*  --   --   PLT 165  --  208  --   --   --  211  --   --   HEPARINUNFRC  --   < >  --   < >  --   < > 0.14* 0.21* 0.39  CREATININE 0.51  --   --   --   --   --  0.52  --   --   < > = values in this interval not displayed.  Estimated Creatinine Clearance: 94.5 ml/min (by C-G formula based on Cr of 0.52).  Infusions:  . dextrose 5 % and 0.9 % NaCl with KCl 20 mEq/L 75 mL/hr at 08/05/12 1856  . heparin 2,550 Units/hr (08/05/12 1506)    Assessment: 37 y/o female s/p ex lap, aortobifemoral bypass graft, bilateral femoral embolectomies, and bilateral intraoperative arteriograms on heparin for suspected hypercoagulable issue.   Heparin level now therapeutic while receiving infusion at 2550 units/hr. No bleeding issues noted, will continue at current rate.  Goal of Therapy:  Heparin level 0.3-0.7 units/ml Monitor platelets by anticoagulation protocol: Yes   Plan:  1. Continue heparin IV infusion at 2550 units/hr.  2. Heparin level daily  Sheppard Coil PharmD., BCPS Clinical Pharmacist Pager [phone] 08/05/2012 10:19 PM

## 2012-08-06 LAB — TYPE AND SCREEN

## 2012-08-06 LAB — CBC
MCH: 28.3 pg (ref 26.0–34.0)
Platelets: 216 10*3/uL (ref 150–400)
RBC: 2.93 MIL/uL — ABNORMAL LOW (ref 3.87–5.11)
RDW: 16 % — ABNORMAL HIGH (ref 11.5–15.5)
WBC: 7.5 10*3/uL (ref 4.0–10.5)

## 2012-08-06 MED ORDER — WARFARIN VIDEO
Freq: Once | Status: AC
  Administered 2012-08-06: 15:00:00

## 2012-08-06 MED ORDER — WARFARIN SODIUM 7.5 MG PO TABS
7.5000 mg | ORAL_TABLET | Freq: Once | ORAL | Status: AC
  Administered 2012-08-06: 7.5 mg via ORAL
  Filled 2012-08-06: qty 1

## 2012-08-06 MED ORDER — SODIUM CHLORIDE 0.9 % IV SOLN
INTRAVENOUS | Status: DC
  Administered 2012-08-07: 01:00:00 via INTRAVENOUS

## 2012-08-06 MED ORDER — KCL IN DEXTROSE-NACL 20-5-0.9 MEQ/L-%-% IV SOLN
INTRAVENOUS | Status: DC
  Administered 2012-08-06 – 2012-08-07 (×2): via INTRAVENOUS
  Filled 2012-08-06 (×6): qty 1000

## 2012-08-06 MED ORDER — BISACODYL 10 MG RE SUPP
10.0000 mg | Freq: Every day | RECTAL | Status: DC | PRN
  Administered 2012-08-06: 10 mg via RECTAL
  Filled 2012-08-06: qty 1

## 2012-08-06 MED ORDER — COUMADIN BOOK
Freq: Once | Status: AC
  Administered 2012-08-06: 15:00:00
  Filled 2012-08-06: qty 1

## 2012-08-06 MED ORDER — WARFARIN - PHARMACIST DOSING INPATIENT: Freq: Every day | Status: DC

## 2012-08-06 MED ORDER — OXYCODONE-ACETAMINOPHEN 5-325 MG PO TABS
1.0000 | ORAL_TABLET | ORAL | Status: DC | PRN
  Administered 2012-08-06 – 2012-08-08 (×8): 2 via ORAL
  Administered 2012-08-08: 1 via ORAL
  Administered 2012-08-09 – 2012-08-10 (×4): 2 via ORAL
  Filled 2012-08-06 (×14): qty 2

## 2012-08-06 MED ORDER — GABAPENTIN 300 MG PO CAPS
300.0000 mg | ORAL_CAPSULE | Freq: Two times a day (BID) | ORAL | Status: DC
  Administered 2012-08-06 – 2012-08-10 (×9): 300 mg via ORAL
  Filled 2012-08-06 (×10): qty 1
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
## 2012-08-06 NOTE — Progress Notes (Addendum)
[facility] [place] Post-op  Intra-abdominal Surgery note  Date of Surgery: 08/01/2012 - 08/02/2012  Surgeon(s): Chuck Hint, MD  4 Days Post-Op Procedure(s): AORTA BIFEMORAL BYPASS GRAFT with bilateral femoral embolectomies and intraoperative arteriogram  History of Present Illness  Diana Moreno is a 37 y.o. female who is  up s/p Procedure(s): AORTA BIFEMORAL BYPASS GRAFT with bilateral femoral embolectomies and intraoperative arteriogram Pt is doing well. denies incisional pain; denies nausea/vomiting; denies diarrhea. has had flatus;has not had BM C/O burning in heels and pain in left great toe Significant Diagnostic Studies: CBC Lab Results  Component Value Date   WBC 7.5 08/06/2012   HGB 8.3* 08/06/2012   HCT 24.9* 08/06/2012   MCV 85.0 08/06/2012   PLT 216 08/06/2012   COAG Lab Results  Component Value Date   INR 1.09 08/02/2012   INR 1.00 08/02/2012   I/O last 3 completed shifts: In: 5472.3 [P.O.:360; I.V.:5112.3] Out: 1050 [Urine:1050]   Physical Examination BP Readings from Last 3 Encounters:  08/06/12 114/84  08/06/12 114/84   Temp Readings from Last 3 Encounters:  08/06/12 98.1 F (36.7 C) Oral  08/06/12 98.1 F (36.7 C) Oral   SpO2 Readings from Last 3 Encounters:  08/06/12 94%  08/06/12 94%   Pulse Readings from Last 3 Encounters:  08/06/12 89  08/06/12 89   General: A&O x 3, WDWN female in NAD Pulmonary: normal non-labored breathing , without Rales, rhonchi,  wheezing Cardiac: Heart rate : regular ,  Abdomen:abdomen soft, non-tender and normal active bowel sounds Abdominal wound:clean, dry, intact  Neurologic: A&O X 3; Appropriate Affect ;  SENSATION: burning pain in heels   MOTOR FUNCTION:  moving all extremities equally.  Speech is fluent/normal Left great toe demarcating Vascular Exam:BLE warm and well perfused Good peroneal signal on left; DP/PT signal on right Extremities without ischemic changes, no  Gangrene, no cellulitis; no open wounds;   Assessment/Plan: Diana Moreno is a 37 y.o. female who is 4 Days Post-Op Procedure(s): AORTA BIFEMORAL BYPASS GRAFT with bilateral femoral embolectomies and intraoperative arteriogram Will add po pain meds DC IVF Cont PT/OT ? gabapentin for pain in heels  ROCZNIAK,REGINA J [phone] 08/06/2012 8:14 AM  Agree with above.  Issues are: 1. 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 (38 year old with no other risk factors, except tobacco). We have discussed the importance of avoiding all nicotine including second hand smoke. 2. Possible hypercoagulable condition. Dr. Arbutus Ped has reviewed her hypercoagulable panel and there were no abnormalities to explain her thrombotic event. He recommends 6-12 months of coumadin. Will start coumadin tonight. (dosing per pharmacy).  3. Need to rule out cardiac source for embolization. I have ordered a TEE and spoke with [facility] concerning this.  4. Cont ambulation and advance diet.   Waverly Ferrari, MD, FACS Beeper [phone] 08/06/2012

## 2012-08-06 NOTE — Progress Notes (Signed)
Physical Therapy Treatment Patient Details Name: Diana Moreno MRN: [id number] DOB: 10-16-1975 Today's Date: 08/06/2012 Time: 4098-1191 PT Time Calculation (min): 40 min  PT Assessment / Plan / Recommendation Comments on Treatment Session  Able to walk despite increased pain in feet today and stood on one foot at times while bathing at the bedside.  Did desaturate with ambulation on 4 L O2, but returned to 90% or above quickly with rest.    Follow Up Recommendations  Home health PT           Equipment Recommendations  Rolling walker with 5" wheels       Frequency Min 4X/week   Plan Discharge plan remains appropriate    Precautions / Restrictions Precautions Precaution Comments: multiple lines, 2 Iv poles and portable O2   Pertinent Vitals/Pain Severe pain in feet with ambulation; HR 123, SpO2 at lowest 84% increased > 90% seated rest 30 sec on 4 L O2 throughout    Mobility  Bed Mobility Bed Mobility: Scooting to HOB Supine to Sit: 6: Modified independent (Device/Increase time);HOB elevated Sit to Supine: 6: Modified independent (Device/Increase time);HOB elevated Scooting to [facility]: 6: Modified independent (Device/Increase time);With rail Details for Bed Mobility Assistance: moving well, just slow due to pain Transfers Sit to Stand: 4: Min guard;From bed;From chair/3-in-1 Stand to Sit: 5: Supervision;To chair/3-in-1;To bed Ambulation/Gait Ambulation/Gait Assistance: 4: Min guard;5: Supervision Ambulation Distance (Feet): 250 Feet Assistive device: Rolling walker Ambulation/Gait Assistance Details: flexed with increased UE reliance with c/o pain (noted left great toe bluish in color.)  Little speedy with all lines and 2 assist, but determined despite severe pain in feet. Gait Pattern: Step-through pattern;Trunk flexed;Decreased stride length     PT Goals Acute Rehab PT Goals Pt will go Supine/Side to Sit: with modified independence PT Goal: Supine/Side to Sit - Progress:  Met Pt will go Sit to Supine/Side: with modified independence PT Goal: Sit to Supine/Side - Progress: Met Pt will go Sit to Stand: with modified independence PT Goal: Sit to Stand - Progress: Progressing toward goal Pt will go Stand to Sit: with modified independence PT Goal: Stand to Sit - Progress: Progressing toward goal Pt will Ambulate: >150 feet;with supervision;with least restrictive assistive device PT Goal: Ambulate - Progress: Progressing toward goal  Visit Information  Last PT Received On: 08/06/12 Assistance Needed:  (for 2 IV poles and O2)    Subjective Data  Subjective: Feet hurting more today.   Cognition  Cognition Overall Cognitive Status: Appears within functional limits for tasks assessed/performed Arousal/Alertness: Awake/alert Orientation Level: Appears intact for tasks assessed Behavior During Session: [facility] for tasks performed    Balance  Static Standing Balance Static Standing - Balance Support: During functional activity;No upper extremity supported;Left upper extremity supported Static Standing - Level of Assistance: 5: Stand by assistance Static Standing - Comment/# of Minutes: stood x about 2-3 minutes while bathing herself with stand by assist and able to pick up one foot to wash with left hand on bedrail.    End of Session PT - End of Session Equipment Utilized During Treatment: Gait belt;Oxygen Activity Tolerance: Patient limited by pain Patient left: in bed;with call bell/phone within reach   GP     [facility] 08/06/2012, 12:28 PM [place], [place] [phone] 08/06/2012

## 2012-08-07 ENCOUNTER — Encounter (HOSPITAL_COMMUNITY): Payer: Self-pay

## 2012-08-07 ENCOUNTER — Encounter (HOSPITAL_COMMUNITY)
Admission: EM | Disposition: A | Discharge status: Home Health | Payer: Self-pay | Source: Home / Self Care | Attending: Vascular Surgery

## 2012-08-07 DIAGNOSIS — I059 Rheumatic mitral valve disease, unspecified: Secondary | ICD-10-CM

## 2012-08-07 HISTORY — PX: TEE WITHOUT CARDIOVERSION: SHX5443

## 2012-08-07 LAB — CBC
HCT: 24.7 % — ABNORMAL LOW (ref 36.0–46.0)
Hemoglobin: 8.3 g/dL — ABNORMAL LOW (ref 12.0–15.0)
MCH: 28.9 pg (ref 26.0–34.0)
MCHC: 33.6 g/dL (ref 30.0–36.0)

## 2012-08-07 LAB — BASIC METABOLIC PANEL
BUN: 4 mg/dL — ABNORMAL LOW (ref 6–23)
CO2: 23 mEq/L (ref 19–32)
GFR calc non Af Amer: >90 mL/min (ref >90)
Glucose, Bld: 91 mg/dL (ref 70–99)
Potassium: 3.6 mEq/L (ref 3.5–5.1)

## 2012-08-07 LAB — HEPARIN LEVEL (UNFRACTIONATED): Heparin Unfractionated: 0.95 IU/mL — ABNORMAL HIGH (ref 0.30–0.70)

## 2012-08-07 SURGERY — ECHOCARDIOGRAM, TRANSESOPHAGEAL
Anesthesia: Moderate Sedation

## 2012-08-07 MED ORDER — FENTANYL CITRATE 0.05 MG/ML IJ SOLN
INTRAMUSCULAR | Status: DC | PRN
  Administered 2012-08-07 (×3): 25 ug via INTRAVENOUS

## 2012-08-07 MED ORDER — LIDOCAINE VISCOUS 2 % MT SOLN
OROMUCOSAL | Status: DC | PRN
  Administered 2012-08-07: 10 mL via OROMUCOSAL

## 2012-08-07 MED ORDER — WARFARIN SODIUM 5 MG PO TABS
5.0000 mg | ORAL_TABLET | Freq: Once | ORAL | Status: AC
  Administered 2012-08-07: 5 mg via ORAL
  Filled 2012-08-07: qty 1

## 2012-08-07 MED ORDER — PERFLUTREN LIPID MICROSPHERE
1.0000 mL | INTRAVENOUS | Status: AC | PRN
  Administered 2012-08-07: 2 mL via INTRAVENOUS
  Filled 2012-08-07: qty 10

## 2012-08-07 MED ORDER — DIPHENHYDRAMINE HCL 50 MG/ML IJ SOLN
INTRAMUSCULAR | Status: AC
  Filled 2012-08-07: qty 1

## 2012-08-07 MED ORDER — DIAZEPAM 5 MG PO TABS
5.0000 mg | ORAL_TABLET | Freq: Three times a day (TID) | ORAL | Status: DC | PRN
  Administered 2012-08-07 – 2012-08-10 (×7): 5 mg via ORAL
  Filled 2012-08-07 (×7): qty 1

## 2012-08-07 MED ORDER — LIDOCAINE VISCOUS 2 % MT SOLN
OROMUCOSAL | Status: AC
  Filled 2012-08-07: qty 15

## 2012-08-07 MED ORDER — FENTANYL CITRATE 0.05 MG/ML IJ SOLN
INTRAMUSCULAR | Status: AC
  Filled 2012-08-07: qty 2

## 2012-08-07 MED ORDER — MIDAZOLAM HCL 10 MG/2ML IJ SOLN
INTRAMUSCULAR | Status: DC | PRN
  Administered 2012-08-07 (×3): 2 mg via INTRAVENOUS

## 2012-08-07 MED ORDER — MIDAZOLAM HCL 5 MG/ML IJ SOLN
INTRAMUSCULAR | Status: AC
  Filled 2012-08-07: qty 2

## 2012-08-07 NOTE — Progress Notes (Signed)
ANTICOAGULATION CONSULT NOTE - Follow Up Consult  Pharmacy Consult for heparin and coumadin Indication: arotic embolus / hypercoagulable state  Allergies  Allergen Reactions  . Sulfur     Patient Measurements: Height: 5\' 5"  (165.1 cm) Weight: 151 lb 1.6 oz (68.539 kg) IBW/kg (Calculated) : 57 Heparin Dosing Weight: 69 kg  Vital Signs: Temp: 98.7 F (37.1 C) (03/21 1311) Temp src: Oral (03/21 1311) BP: 119/75 mmHg (03/21 1311) Pulse Rate: 96 (03/21 1311)  Labs:  Recent Labs  08/05/12 0444  08/06/12 0524 08/07/12 1054 08/07/12 1755  HGB 8.7*  --  8.3* 8.3*  --   HCT 26.0*  --  24.9* 24.7*  --   PLT 211  --  216 238  --   LABPROT  --   --   --  18.5*  --   INR  --   --   --  1.59*  --   HEPARINUNFRC 0.14*  < > 0.36 0.95* 0.52  CREATININE 0.52  --   --  0.49*  --   < > = values in this interval not displayed.  Estimated Creatinine Clearance: 94.5 ml/min (by C-G formula based on Cr of 0.49).   Medications:  Scheduled:  . bisacodyl  10 mg Rectal Once  . gabapentin  300 mg Oral BID  . pantoprazole (PROTONIX) IV  40 mg Intravenous QHS  . sodium chloride  10-40 mL Intracatheter Q12H  . [COMPLETED] warfarin  5 mg Oral ONCE-1800  . Warfarin - Pharmacist Dosing Inpatient   Does not apply q1800   Infusions:  . dextrose 5 % and 0.9 % NaCl with KCl 20 mEq/L 50 mL/hr at 08/07/12 0351  . heparin 2,500 Units/hr (08/07/12 1800)  . [DISCONTINUED] sodium chloride 10 mL/hr at 08/07/12 0030    Assessment: 37 yo female with aortic embolus / possible hypercoagulable state is currently on therapeutic heparin and subtherapeutic coumadin.  Heparin level is 0.52. Goal of Therapy:  Heparin level 0.3-0.7 units/ml and INR 2-3 Monitor platelets by anticoagulation protocol: Yes   Plan:  1) Continue heparin at  2500 units/hr. 2) Heparin level and CBC in AM.  Mickeal Skinner 08/07/2012,6:44 PM

## 2012-08-07 NOTE — Interval H&P Note (Signed)
History and Physical Interval Note:  08/07/2012 9:11 AM  Diana Moreno  has presented today for surgery, with the diagnosis of Source of Clots  The various methods of treatment have been discussed with the patient and family. After consideration of risks, benefits and other options for treatment, the patient has consented to  Procedure(s) with comments: TRANSESOPHAGEAL ECHOCARDIOGRAM (TEE) (N/A) - Rm 2034 as a surgical intervention .  The patient's history has been reviewed, patient examined, no change in status, stable for surgery.  I have reviewed the patient's chart and labs.  Questions were answered to the patient's satisfaction.     Dietrich Pates

## 2012-08-07 NOTE — Progress Notes (Signed)
*  PRELIMINARY RESULTS* Echocardiogram Echocardiogram Transesophageal has been performed.  Diana Moreno 08/07/2012, 11:10 AM

## 2012-08-07 NOTE — Progress Notes (Signed)
  Echocardiogram 2D Echocardiogram with Definity has been performed.  Diana Moreno 08/07/2012, 2:43 PM

## 2012-08-07 NOTE — H&P (View-Only) (Signed)
[facility] [place]  Post-op  Intra-abdominal Surgery note  Date of Surgery: 08/01/2012 - 08/02/2012  Surgeon(s): Chuck Hint, MD  5 Days Post-Op Procedure(s): AORTA BIFEMORAL BYPASS GRAFT with bilateral femoral embolectomies and intraoperative arteriogram  History of Present Illness  Nonie Lochner is a 37 y.o. female who is  up s/p Procedure(s): AORTA BIFEMORAL BYPASS GRAFT with bilateral femoral embolectomies and intraoperative arteriogram Pt is doing well. complains of incisional pain; denies nausea/vomiting; denies diarrhea. has had flatus;has not had BM  IMAGING: No results found.  Significant Diagnostic Studies: CBC Lab Results  Component Value Date   WBC 7.5 08/06/2012   HGB 8.3* 08/06/2012   HCT 24.9* 08/06/2012   MCV 85.0 08/06/2012   PLT 216 08/06/2012    BMET    Component Value Date/Time   NA 142 08/05/2012 0444   K 3.4* 08/05/2012 0444   CL 109 08/05/2012 0444   CO2 23 08/05/2012 0444   GLUCOSE 111* 08/05/2012 0444   BUN 5* 08/05/2012 0444   CREATININE 0.52 08/05/2012 0444   CALCIUM 8.6 08/05/2012 0444   GFRNONAA >90 08/05/2012 0444   GFRAA >90 08/05/2012 0444    COAG Lab Results  Component Value Date   INR 1.09 08/02/2012   INR 1.00 08/02/2012   No results found for this basename: PTT    I/O last 3 completed shifts: In: 730 [P.O.:720; I.V.:10] Out: 800 [Urine:800]    Physical Examination BP Readings from Last 3 Encounters:  08/07/12 122/93  08/07/12 122/93  08/07/12 122/93   Temp Readings from Last 3 Encounters:  08/07/12 98.4 F (36.9 C) Oral  08/07/12 98.4 F (36.9 C) Oral  08/07/12 98.4 F (36.9 C) Oral   SpO2 Readings from Last 3 Encounters:  08/07/12 93%  08/07/12 93%  08/07/12 93%   Pulse Readings from Last 3 Encounters:  08/07/12 99  08/07/12 99  08/07/12 99    General: A&O x 3, WDWN female in NAD Pulmonary: normal non-labored breathing , without Rales, rhonchi,  wheezing Cardiac: Heart rate :  regular ,  Abdomen:abdomen soft Abdominal wound:clean, dry, intact  Neurologic: A&O X 3; Appropriate Affect ;  SENSATION: normal;  MOTOR FUNCTION:  moving all extremities equally.  Speech is fluent/normal  Vascular Exam:BLE warm and well perfused Extremities without ischemic changes, no Gangrene, no cellulitis; no open wounds;  Dp/PT doppler biphasic bilaterally    Assessment/Plan: Docia Klar is a 37 y.o. female who is 5 Days Post-Op Procedure(s): AORTA BIFEMORAL BYPASS GRAFT with bilateral femoral embolectomies and intraoperative arteriogram Pending TEE today to rule out cardiac evolvement Continue heparin , started coumadin yesterday O2 4L 93 states.  Patient states she has anxiety and feels she is hyperventilating due to anxiety.  I will order valium 5 mg TID PRN      Mosetta Pigeon [phone] 08/07/2012 7:44 AM

## 2012-08-07 NOTE — Progress Notes (Signed)
ANTICOAGULATION CONSULT NOTE - Follow Up Consult  Pharmacy Consult for heparin and coumadin Indication: arotic embolus / hypercoagulable state  Allergies  Allergen Reactions  . Sulfur     Patient Measurements: Height: 5\' 5"  (165.1 cm) Weight: 151 lb 1.6 oz (68.539 kg) IBW/kg (Calculated) : 57 Heparin Dosing Weight: 69 kg  Vital Signs: Temp: 98.1 F (36.7 C) (03/21 0956) Temp src: Oral (03/21 0517) BP: 123/84 mmHg (03/21 1012) Pulse Rate: 99 (03/21 0517)  Labs:  Recent Labs  08/05/12 0444  08/05/12 2132 08/06/12 0524 08/07/12 1054  HGB 8.7*  --   --  8.3* 8.3*  HCT 26.0*  --   --  24.9* 24.7*  PLT 211  --   --  216 238  LABPROT  --   --   --   --  18.5*  INR  --   --   --   --  1.59*  HEPARINUNFRC 0.14*  < > 0.39 0.36 0.95*  CREATININE 0.52  --   --   --   --   < > = values in this interval not displayed.  Estimated Creatinine Clearance: 94.5 ml/min (by C-G formula based on Cr of 0.52).   Medications:  Scheduled:  . bisacodyl  10 mg Rectal Once  . [COMPLETED] coumadin book   Does not apply Once  . gabapentin  300 mg Oral BID  . pantoprazole (PROTONIX) IV  40 mg Intravenous QHS  . sodium chloride  10-40 mL Intracatheter Q12H  . [COMPLETED] warfarin  7.5 mg Oral ONCE-1800  . [COMPLETED] warfarin   Does not apply Once  . Warfarin - Pharmacist Dosing Inpatient   Does not apply q1800   Infusions:  . dextrose 5 % and 0.9 % NaCl with KCl 20 mEq/L 50 mL/hr at 08/07/12 0351  . heparin 2,650 Units/hr (08/07/12 0939)  . [DISCONTINUED] sodium chloride 10 mL/hr at 08/07/12 0030    Assessment: 37 yo female with aortic embolus / possible hypercoagulable state is currently on supratherapeutic heparin and subtherapeutic coumadin.  Heparin level is 0.95 and INR up to 1.59. Goal of Therapy:  Heparin level 0.3-0.7 units/ml and INR 2-3 Monitor platelets by anticoagulation protocol: Yes   Plan:  1) Reduce heparin to 2500 units/hr. 2) Coumadin 5mg  po x1 3) Heparin level  in 6 hours after rate is changed.  Iyonna Rish, Tsz-Yin 08/07/2012,11:29 AM

## 2012-08-07 NOTE — Progress Notes (Addendum)
[facility] [place]  Post-op  Intra-abdominal Surgery note  Date of Surgery: 08/01/2012 - 08/02/2012  Surgeon(s): Chuck Hint, MD  5 Days Post-Op Procedure(s): AORTA BIFEMORAL BYPASS GRAFT with bilateral femoral embolectomies and intraoperative arteriogram  History of Present Illness  Diana Moreno is a 37 y.o. female who is  up s/p Procedure(s): AORTA BIFEMORAL BYPASS GRAFT with bilateral femoral embolectomies and intraoperative arteriogram Pt is doing well. complains of incisional pain; denies nausea/vomiting; denies diarrhea. has had flatus;has not had BM  IMAGING: No results found.  Significant Diagnostic Studies: CBC Lab Results  Component Value Date   WBC 7.5 08/06/2012   HGB 8.3* 08/06/2012   HCT 24.9* 08/06/2012   MCV 85.0 08/06/2012   PLT 216 08/06/2012    BMET    Component Value Date/Time   NA 142 08/05/2012 0444   K 3.4* 08/05/2012 0444   CL 109 08/05/2012 0444   CO2 23 08/05/2012 0444   GLUCOSE 111* 08/05/2012 0444   BUN 5* 08/05/2012 0444   CREATININE 0.52 08/05/2012 0444   CALCIUM 8.6 08/05/2012 0444   GFRNONAA >90 08/05/2012 0444   GFRAA >90 08/05/2012 0444    COAG Lab Results  Component Value Date   INR 1.09 08/02/2012   INR 1.00 08/02/2012   No results found for this basename: PTT    I/O last 3 completed shifts: In: 730 [P.O.:720; I.V.:10] Out: 800 [Urine:800]    Physical Examination BP Readings from Last 3 Encounters:  08/07/12 122/93  08/07/12 122/93  08/07/12 122/93   Temp Readings from Last 3 Encounters:  08/07/12 98.4 F (36.9 C) Oral  08/07/12 98.4 F (36.9 C) Oral  08/07/12 98.4 F (36.9 C) Oral   SpO2 Readings from Last 3 Encounters:  08/07/12 93%  08/07/12 93%  08/07/12 93%   Pulse Readings from Last 3 Encounters:  08/07/12 99  08/07/12 99  08/07/12 99    General: A&O x 3, WDWN female in NAD Pulmonary: normal non-labored breathing , without Rales, rhonchi,  wheezing Cardiac: Heart rate :  regular ,  Abdomen:abdomen soft Abdominal wound:clean, dry, intact  Neurologic: A&O X 3; Appropriate Affect ;  SENSATION: normal;  MOTOR FUNCTION:  moving all extremities equally.  Speech is fluent/normal  Vascular Exam:BLE warm and well perfused Extremities without ischemic changes, no Gangrene, no cellulitis; no open wounds;  Dp/PT doppler biphasic bilaterally  Assessment/Plan: Diana Moreno is a 37 y.o. female who is 5 Days Post-Op Procedure(s): AORTA BIFEMORAL BYPASS GRAFT with bilateral femoral embolectomies and intraoperative arteriogram Pending TEE today to rule out cardiac evolvement Continue heparin , started coumadin yesterday O2 4L 93 states.  Patient states she has anxiety and feels she is hyperventilating due to anxiety.  I will order valium 5 mg TID PRN   Mosetta Pigeon [phone] 08/07/2012 7:44 AM  Agree with above. 2-D echo today shows a density attached to the left ventricle inferior wall possibly thrombus. The left ventricle was mildly dilated. Systolic function was moderately to severely reduced with an estimated ejection fraction of 30-35%. There is akinesis of the inferior posterior and apical myocardium. TEE shows a mobile mass along the inferior posterior wall consistent with thrombus. Patient will continue heparin and Coumadin which is being managed by pharmacy. INR= 1.59 Advance to a regular diet. Will need cardiology's recommendations prior to discharge. Could likely go home Monday.  Waverly Ferrari, MD, FACS Beeper [phone] 08/07/2012

## 2012-08-07 NOTE — Op Note (Signed)
Full report to follow 

## 2012-08-07 NOTE — Progress Notes (Signed)
Physical Therapy Treatment Patient Details Name: Diana Moreno MRN: [id number] DOB: 08/15/1975 Today's Date: 08/07/2012 Time: 4098-1191 PT Time Calculation (min): 25 min  PT Assessment / Plan / Recommendation Comments on Treatment Session  Much improved gait, less foot slap, improved conditioning,  BUT, pt's sats on RA still dip into the high 70's to low 80's  during longer distance gait with mild dyspnea.  Pt able to get sats up to 90 with efficient breathing pattern over ~23min.    Follow Up Recommendations  Home health PT     Does the patient have the potential to tolerate intense rehabilitation     Barriers to Discharge        Equipment Recommendations  Rolling walker with 5" wheels    Recommendations for Other Services    Frequency Min 3X/week   Plan Discharge plan remains appropriate;Frequency needs to be updated    Precautions / Restrictions Precautions Precautions: Fall   Pertinent Vitals/Pain See comments    Mobility  Bed Mobility Bed Mobility: Sit to Supine Sit to Supine: 6: Modified independent (Device/Increase time);HOB elevated Details for Bed Mobility Assistance: moving well, just slow due to pain Transfers Transfers: Sit to Stand;Stand to Sit Sit to Stand: 6: Modified independent (Device/Increase time);With upper extremity assist;From bed;From toilet Stand to Sit: 6: Modified independent (Device/Increase time) Details for Transfer Assistance: safe mobility Ambulation/Gait Ambulation/Gait Assistance: 5: Supervision Ambulation Distance (Feet): 1000 Feet Assistive device: None Ambulation/Gait Assistance Details: generally steady with decr amount of foot slap each day. Gait Pattern: Step-through pattern Gait velocity: subjectively much improved ability to incr gait speed Stairs: No    Exercises     PT Diagnosis:    PT Problem List:   PT Treatment Interventions:     PT Goals Acute Rehab PT Goals Time For Goal Achievement: 08/10/12 Potential to  Achieve Goals: Good Pt will Roll Supine to Left Side: with modified independence PT Goal: Rolling Supine to Left Side - Progress: Goal set today Pt will go Supine/Side to Sit: with modified independence PT Goal: Supine/Side to Sit - Progress: Met Pt will go Sit to Supine/Side: with modified independence PT Goal: Sit to Supine/Side - Progress: Met Pt will go Sit to Stand: with modified independence PT Goal: Sit to Stand - Progress: Met Pt will go Stand to Sit: with modified independence PT Goal: Stand to Sit - Progress: Met Pt will Ambulate: with modified independence;with least restrictive assistive device;>150 feet PT Goal: Ambulate - Progress: Updated due to goal met  Visit Information  Last PT Received On: 08/07/12 Assistance Needed: +1    Subjective Data  Subjective: feet are numb and with bee stings.Marland KitchenMarland KitchenI need to get off them.   Cognition  Cognition Overall Cognitive Status: Appears within functional limits for tasks assessed/performed Arousal/Alertness: Awake/alert Orientation Level: Appears intact for tasks assessed Behavior During Session: [facility] for tasks performed    Balance  Balance Balance Assessed: Yes Dynamic Standing Balance Dynamic Standing - Balance Support: During functional activity;No upper extremity supported Dynamic Standing - Level of Assistance: 5: Stand by assistance High Level Balance High Level Balance Activites: Direction changes;Turns;Head turns;Sudden stops High Level Balance Comments: pt became more unsteady when scanning back over either shoulder  End of Session PT - End of Session Activity Tolerance: Patient tolerated treatment well;Other (comment) (but has foot pain) Patient left: in bed;with call bell/phone within reach;with family/visitor present Nurse Communication: Mobility status   GP     Darolyn Double, Eliseo Gum 08/07/2012, 3:49 PM 08/07/2012  Scipio Bing, PT  [phone] (pager)

## 2012-08-08 LAB — CBC
HCT: 24.5 % — ABNORMAL LOW (ref 36.0–46.0)
Hemoglobin: 8.2 g/dL — ABNORMAL LOW (ref 12.0–15.0)
MCHC: 33.5 g/dL (ref 30.0–36.0)

## 2012-08-08 LAB — PROTIME-INR: INR: 2.29 — ABNORMAL HIGH (ref 0.00–1.49)

## 2012-08-08 LAB — HEPARIN LEVEL (UNFRACTIONATED): Heparin Unfractionated: 0.38 IU/mL (ref 0.30–0.70)

## 2012-08-08 MED ORDER — WARFARIN SODIUM 5 MG PO TABS
5.0000 mg | ORAL_TABLET | Freq: Once | ORAL | Status: AC
  Administered 2012-08-08: 5 mg via ORAL
  Filled 2012-08-08: qty 1

## 2012-08-08 NOTE — Progress Notes (Addendum)
[facility] Progress Note  08/08/2012 9:49 AM POD 6  Subjective:  States her feet are numb; states she ate a big meal yesterday and did have some nausea last pm that is resolved.  She has had a muffin this am without nausea.  Afebrile x 24 hrs HR 90's-110   100-120 systolic 89% 2LO2NC this am at 0400  Filed Vitals:   08/08/12 0427  BP: 107/69  Pulse: 100  Temp: 98.2 F (36.8 C)  Resp: 18     Physical Exam: Cardiac:  regular Lungs:  CTAB Abdomen:  Soft NT/ND + BS Incisions:  Laparotomy incision is c/d/i and bilateral groin incisions are c/d/i and soft without hematoma Extremities:  + peroneal doppler signal left foot; there continues to be discoloration of the left great toe; + DP doppler DP signal on the right; sensation is decreased in both feet.  Motor strength is equal in both feet.  CBC    Component Value Date/Time   WBC 11.2* 08/08/2012 0548   RBC 2.88* 08/08/2012 0548   HGB 8.2* 08/08/2012 0548   HCT 24.5* 08/08/2012 0548   PLT 228 08/08/2012 0548   MCV 85.1 08/08/2012 0548   MCH 28.5 08/08/2012 0548   MCHC 33.5 08/08/2012 0548   RDW 15.6* 08/08/2012 0548    BMET    Component Value Date/Time   NA 136 08/07/2012 1054   K 3.6 08/07/2012 1054   CL 103 08/07/2012 1054   CO2 23 08/07/2012 1054   GLUCOSE 91 08/07/2012 1054   BUN 4* 08/07/2012 1054   CREATININE 0.49* 08/07/2012 1054   CALCIUM 8.5 08/07/2012 1054   GFRNONAA >90 08/07/2012 1054   GFRAA >90 08/07/2012 1054    INR    Component Value Date/Time   INR 2.29* 08/08/2012 0548     Intake/Output Summary (Last 24 hours) at 08/08/12 0949 Last data filed at 08/08/12 0636  Gross per 24 hour  Intake    515 ml  Output      0 ml  Net    515 ml     Assessment/Plan:  37 y.o. female is s/p  1. Exploratory laparotomy  2. Aortobifemoral bypass graft (12 mm x 7 mm Dacron graft)  3. Bilateral femoral embolectomies  4. Bilateral intraoperative arteriograms    POD 6  -2-D echo shows a density  attached to the left ventricle inferior wall possibly thrombus. The left ventricle was mildly dilated. Systolic function was moderately to severely reduced with an estimated ejection fraction of 30-35%. There is akinesis of the inferior posterior and apical myocardium.  TEE shows a mobile mass along the inferior posterior wall consistent with thrombus.  Patient will continue heparin and Coumadin which is being managed by pharmacy.  -INR is 2.29 today-pt continues on heparin gtt per pharmacy -will need cardiology consult on Monday per Dr. Edilia Bo -continue mobilization-pt is walking to bathroom alone and is ambulating in halls with walker -tolerating diet-did have some nausea last pm but this has resolved.    Doreatha Massed, PA-C [facility] [phone] 08/08/2012 9:49 AM  Agree with above assessment Both lower extremities stable All incisions healing nicely Cardiology consult Monday a.m. and hopefully DC Monday p.m.

## 2012-08-08 NOTE — Progress Notes (Signed)
ANTICOAGULATION CONSULT NOTE - Follow Up Consult  Pharmacy Consult for coumadin and heparin Indication: aortic thrombus / hypercoagulable state  Allergies  Allergen Reactions  . Sulfur     Patient Measurements: Height: 5\' 5"  (165.1 cm) Weight: 151 lb 1.6 oz (68.539 kg) IBW/kg (Calculated) : 57 Heparin Dosing Weight:   Vital Signs: Temp: 98.2 F (36.8 C) (03/22 0427) Temp src: Oral (03/22 0427) BP: 107/69 mmHg (03/22 0427) Pulse Rate: 100 (03/22 0427)  Labs:  Recent Labs  08/06/12 0524 08/07/12 1054 08/07/12 1755 08/08/12 0548  HGB 8.3* 8.3*  --  8.2*  HCT 24.9* 24.7*  --  24.5*  PLT 216 238  --  228  LABPROT  --  18.5*  --  24.2*  INR  --  1.59*  --  2.29*  HEPARINUNFRC 0.36 0.95* 0.52 0.38  CREATININE  --  0.49*  --   --     Estimated Creatinine Clearance: 94.5 ml/min (by C-G formula based on Cr of 0.49).   Medications:  Scheduled:  . bisacodyl  10 mg Rectal Once  . gabapentin  300 mg Oral BID  . pantoprazole (PROTONIX) IV  40 mg Intravenous QHS  . sodium chloride  10-40 mL Intracatheter Q12H  . [COMPLETED] warfarin  5 mg Oral ONCE-1800  . warfarin  5 mg Oral ONCE-1800  . Warfarin - Pharmacist Dosing Inpatient   Does not apply q1800   Infusions:  . dextrose 5 % and 0.9 % NaCl with KCl 20 mEq/L 50 mL/hr at 08/07/12 0351  . 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  INR up to 2.29 and heparin level was 0.38.  CBC stable. Goal of Therapy:  Heparin level 0.3-0.7 units/ml and INR 2-3 Monitor platelets by anticoagulation protocol: Yes   Plan:  1) d/c heparin level and heparin drip (ok per MD) 2) Coumadin 5mg  po x1 3) INR in am.  Vashon Arch, Tsz-Yin 08/08/2012,11:18 AM

## 2012-08-09 ENCOUNTER — Inpatient Hospital Stay (HOSPITAL_COMMUNITY): Payer: Self-pay

## 2012-08-09 DIAGNOSIS — I429 Cardiomyopathy, unspecified: Secondary | ICD-10-CM | POA: Diagnosis present

## 2012-08-09 DIAGNOSIS — Z48812 Encounter for surgical aftercare following surgery on the circulatory system: Secondary | ICD-10-CM

## 2012-08-09 DIAGNOSIS — I428 Other cardiomyopathies: Secondary | ICD-10-CM

## 2012-08-09 DIAGNOSIS — I513 Intracardiac thrombosis, not elsewhere classified: Secondary | ICD-10-CM | POA: Diagnosis present

## 2012-08-09 DIAGNOSIS — Z720 Tobacco use: Secondary | ICD-10-CM

## 2012-08-09 DIAGNOSIS — Z95828 Presence of other vascular implants and grafts: Secondary | ICD-10-CM

## 2012-08-09 DIAGNOSIS — Z7901 Long term (current) use of anticoagulants: Secondary | ICD-10-CM

## 2012-08-09 HISTORY — DX: Intracardiac thrombosis, not elsewhere classified: I51.3

## 2012-08-09 HISTORY — DX: Long term (current) use of anticoagulants: Z79.01

## 2012-08-09 LAB — CBC
HCT: 23.8 % — ABNORMAL LOW (ref 36.0–46.0)
Platelets: 246 10*3/uL (ref 150–400)
RDW: 16 % — ABNORMAL HIGH (ref 11.5–15.5)
WBC: 8.4 10*3/uL (ref 4.0–10.5)

## 2012-08-09 LAB — PROTIME-INR
INR: 2.63 — ABNORMAL HIGH (ref 0.00–1.49)
Prothrombin Time: 26.8 seconds — ABNORMAL HIGH (ref 11.6–15.2)

## 2012-08-09 LAB — CREATININE, SERUM
Creatinine, Ser: 0.48 mg/dL — ABNORMAL LOW (ref 0.50–1.10)
GFR calc Af Amer: >90 mL/min (ref >90)

## 2012-08-09 MED ORDER — FUROSEMIDE 40 MG PO TABS
40.0000 mg | ORAL_TABLET | Freq: Once | ORAL | Status: AC
  Administered 2012-08-09: 40 mg via ORAL
  Filled 2012-08-09: qty 1

## 2012-08-09 MED ORDER — ASPIRIN EC 81 MG PO TBEC
81.0000 mg | DELAYED_RELEASE_TABLET | Freq: Every day | ORAL | Status: DC
  Administered 2012-08-09 – 2012-08-10 (×2): 81 mg via ORAL
  Filled 2012-08-09 (×2): qty 1

## 2012-08-09 MED ORDER — WARFARIN SODIUM 5 MG PO TABS
5.0000 mg | ORAL_TABLET | Freq: Once | ORAL | Status: AC
  Administered 2012-08-09: 5 mg via ORAL
  Filled 2012-08-09: qty 1

## 2012-08-09 MED ORDER — COUMADIN BOOK
Freq: Once | Status: AC
  Administered 2012-08-09: 10:00:00
  Filled 2012-08-09: qty 1

## 2012-08-09 MED ORDER — PATIENT'S GUIDE TO USING COUMADIN BOOK
Freq: Once | Status: AC
  Administered 2012-08-09: 10:00:00
  Filled 2012-08-09: qty 1

## 2012-08-09 MED ORDER — CARVEDILOL 3.125 MG PO TABS
3.1250 mg | ORAL_TABLET | Freq: Two times a day (BID) | ORAL | Status: DC
  Administered 2012-08-09 – 2012-08-10 (×2): 3.125 mg via ORAL
  Filled 2012-08-09 (×4): qty 1

## 2012-08-09 MED ORDER — ATORVASTATIN CALCIUM 40 MG PO TABS
40.0000 mg | ORAL_TABLET | Freq: Every day | ORAL | Status: DC
  Administered 2012-08-09: 40 mg via ORAL
  Filled 2012-08-09 (×2): qty 1
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
## 2012-08-09 NOTE — Progress Notes (Signed)
ANTICOAGULATION CONSULT NOTE - Follow Up Consult  Pharmacy Consult for coumadin Indication: left ventricular thrombus  Allergies  Allergen Reactions  . Sulfur     Patient Measurements: Height: 5\' 5"  (165.1 cm) Weight: 151 lb 1.6 oz (68.539 kg) IBW/kg (Calculated) : 57 Heparin Dosing Weight:   Vital Signs: Temp: 98.1 F (36.7 C) (03/23 0458) Temp src: Oral (03/23 0458) BP: 115/77 mmHg (03/23 0458) Pulse Rate: 86 (03/23 0458)  Labs:  Recent Labs  08/07/12 1054 08/07/12 1755 08/08/12 0548 08/09/12 0505  HGB 8.3*  --  8.2* 8.0*  HCT 24.7*  --  24.5* 23.8*  PLT 238  --  228 246  LABPROT 18.5*  --  24.2* 26.8*  INR 1.59*  --  2.29* 2.63*  HEPARINUNFRC 0.95* 0.52 0.38  --   CREATININE 0.49*  --   --  0.48*    Estimated Creatinine Clearance: 94.5 ml/min (by C-G formula based on Cr of 0.48).   Medications:  Scheduled:  . aspirin EC  81 mg Oral Daily  . atorvastatin  40 mg Oral q1800  . bisacodyl  10 mg Rectal Once  . carvedilol  3.125 mg Oral BID WC  . coumadin book   Does not apply Once  . furosemide  40 mg Oral Once  . gabapentin  300 mg Oral BID  . pantoprazole (PROTONIX) IV  40 mg Intravenous QHS  . patient's guide to using coumadin book   Does not apply Once  . sodium chloride  10-40 mL Intracatheter Q12H  . [COMPLETED] warfarin  5 mg Oral ONCE-1800  . Warfarin - Pharmacist Dosing Inpatient   Does not apply q1800   Infusions:  . dextrose 5 % and 0.9 % NaCl with KCl 20 mEq/L 50 mL/hr at 08/07/12 0351    Assessment: 37 yo female with left ventricular thrombus is currently on therapeutic coumadin.  INR today up to 2.63 Goal of Therapy:  INR 2-3    Plan:  1) Coumadin 5mg  po x1 2) INR in am.  Traylen Eckels, Tsz-Yin 08/09/2012,11:29 AM

## 2012-08-09 NOTE — Progress Notes (Addendum)
[facility] Progress Note  08/09/2012 8:39 AM POD 7  Subjective:  No complaints  Afebrile x 24 hrs VSS   Filed Vitals:   08/09/12 0458  BP: 115/77  Pulse: 86  Temp: 98.1 F (36.7 C)  Resp: 20     Physical Exam: Cardiac:  regular Lungs:  Non labored Abdomen:  Soft NT/ND Incisions:  C/d/i bilateral groin incisions are soft without hemaotoma Extremities:  + peroneal doppler signal on the left; + DP doppler signal on the right.  Left great toe continues to be discolored, but this is unchanged from yesterday.  CBC    Component Value Date/Time   WBC 8.4 08/09/2012 0505   RBC 2.76* 08/09/2012 0505   HGB 8.0* 08/09/2012 0505   HCT 23.8* 08/09/2012 0505   PLT 246 08/09/2012 0505   MCV 86.2 08/09/2012 0505   MCH 29.0 08/09/2012 0505   MCHC 33.6 08/09/2012 0505   RDW 16.0* 08/09/2012 0505    BMET    Component Value Date/Time   NA 136 08/07/2012 1054   K 3.6 08/07/2012 1054   CL 103 08/07/2012 1054   CO2 23 08/07/2012 1054   GLUCOSE 91 08/07/2012 1054   BUN 4* 08/07/2012 1054   CREATININE 0.48* 08/09/2012 0505   CALCIUM 8.5 08/07/2012 1054   GFRNONAA >90 08/09/2012 0505   GFRAA >90 08/09/2012 0505    INR    Component Value Date/Time   INR 2.63* 08/09/2012 0505     Intake/Output Summary (Last 24 hours) at 08/09/12 0839 Last data filed at 08/09/12 1610  Gross per 24 hour  Intake    865 ml  Output    300 ml  Net    565 ml     Assessment/Plan:  38 y.o. female is s/p  1. Exploratory laparotomy  2. Aortobifemoral bypass graft (12 mm x 7 mm Dacron graft)  3. Bilateral femoral embolectomies  4. Bilateral intraoperative arteriograms    POD 7  -INR 2.63 today after 2 doses of coumadin (7.5 and 5 mg)-continues to be on heparin per pharmacy. -acute surgical blood loss anemia stable -cardiology consult tomorrow and possibly home tomorrow afternoon -mobilizing better today without walker -continues to tolerate diet-did have nausea last pm with lasagna, but  states that tomato sauce usually makes her nauseated and it was a heavy meal out from surgery.  Discussed with pt to eat lighter meals for now.  Nausea is resolved this am.   Doreatha Massed, PA-C [facility] [phone] 08/09/2012 8:39 AM  Abdominal and inguinal and healing nicely with 3+ femoral pulses palpable Left first toe discoloration is stable with no infection or ulceration Ambulating better and appetite slowly improving Coumadin regulated with INR 2.64  To be seen by cardiology in a.m. for followup disposition and patient can then DC home probably in p.m. tomorrow

## 2012-08-09 NOTE — Progress Notes (Addendum)
VASCULAR LAB PRELIMINARY  ARTERIAL  ABI completed:Unable to Doppler bilateral PTAs.      RIGHT    LEFT    PRESSURE WAVEFORM  PRESSURE WAVEFORM  BRACHIAL 118 T BRACHIAL 115 T  DP   DP    AT 78 DM AT 66 DM  PT Vein  PT Vein   PER 127 DM PER 105 DM  GREAT TOE  NA GREAT TOE  NA    RIGHT LEFT  ABI 1.0 0.89     Charlie Char, RVT 08/09/2012, 11:26 AM

## 2012-08-10 ENCOUNTER — Encounter (HOSPITAL_COMMUNITY): Payer: Self-pay | Admitting: Certified Registered"

## 2012-08-10 ENCOUNTER — Encounter (HOSPITAL_COMMUNITY): Payer: Self-pay | Admitting: Internal Medicine

## 2012-08-10 ENCOUNTER — Telehealth: Payer: Self-pay | Admitting: Vascular Surgery

## 2012-08-10 DIAGNOSIS — Z9889 Other specified postprocedural states: Secondary | ICD-10-CM

## 2012-08-10 DIAGNOSIS — I219 Acute myocardial infarction, unspecified: Secondary | ICD-10-CM

## 2012-08-10 DIAGNOSIS — I999 Unspecified disorder of circulatory system: Secondary | ICD-10-CM

## 2012-08-10 LAB — PROTIME-INR
INR: 2.56 — ABNORMAL HIGH (ref 0.00–1.49)
Prothrombin Time: 26.3 seconds — ABNORMAL HIGH (ref 11.6–15.2)

## 2012-08-10 LAB — LIPID PANEL
Cholesterol: 136 mg/dL (ref 0–200)
VLDL: 27 mg/dL (ref 0–40)

## 2012-08-10 LAB — TSH: TSH: 2.912 u[IU]/mL (ref 0.350–4.500)

## 2012-08-10 LAB — BASIC METABOLIC PANEL
Calcium: 8.4 mg/dL (ref 8.4–10.5)
GFR calc non Af Amer: >90 mL/min (ref >90)
Sodium: 139 mEq/L (ref 135–145)

## 2012-08-10 LAB — CBC
Platelets: 304 10*3/uL (ref 150–400)
RDW: 15.9 % — ABNORMAL HIGH (ref 11.5–15.5)
WBC: 8.9 10*3/uL (ref 4.0–10.5)

## 2012-08-10 MED ORDER — CARVEDILOL 3.125 MG PO TABS: 3.1250 mg | ORAL_TABLET | Freq: Two times a day (BID) | ORAL | Status: DC

## 2012-08-10 MED ORDER — WARFARIN SODIUM 5 MG PO TABS: 5.0000 mg | ORAL_TABLET | Freq: Every day | ORAL | Status: DC

## 2012-08-10 MED ORDER — DIAZEPAM 5 MG PO TABS: 5.0000 mg | ORAL_TABLET | Freq: Three times a day (TID) | ORAL | Status: DC | PRN

## 2012-08-10 MED ORDER — ATORVASTATIN CALCIUM 40 MG PO TABS: 40.0000 mg | ORAL_TABLET | Freq: Every day | ORAL | Status: DC

## 2012-08-10 MED ORDER — GABAPENTIN 300 MG PO CAPS: 300.0000 mg | ORAL_CAPSULE | Freq: Two times a day (BID) | ORAL | Status: DC

## 2012-08-10 MED ORDER — ASPIRIN 81 MG PO TBEC: 81.0000 mg | DELAYED_RELEASE_TABLET | Freq: Every day | ORAL | Status: DC

## 2012-08-10 MED ORDER — INFLUENZA VIRUS VACC SPLIT PF IM SUSP
0.5000 mL | Freq: Once | INTRAMUSCULAR | Status: AC
  Administered 2012-08-10: 0.5 mL via INTRAMUSCULAR
  Filled 2012-08-10: qty 0.5

## 2012-08-10 MED ORDER — OXYCODONE-ACETAMINOPHEN 5-325 MG PO TABS: 1.0000 | ORAL_TABLET | ORAL | Status: DC | PRN

## 2012-08-10 NOTE — Progress Notes (Signed)
Pt up ambulating in hallway independently at this time; no needs voiced; will cont. To monitor. 
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
## 2012-08-10 NOTE — Progress Notes (Signed)
Pt given 5mg  PO Valium at this time per pt request; will cont. To monitor.

## 2012-08-10 NOTE — Progress Notes (Signed)
OT Cancellation Note  Patient Details Name: Diana Moreno MRN: [id number] DOB: [birth year]   Cancelled Treatment:     Pt not seen today secondary to being d/c home & has already been given d/c instructions by RN. Will sign off OT at this time.  Roselie Awkward Dixon 08/10/2012, 12:43 PM

## 2012-08-10 NOTE — Telephone Encounter (Signed)
Message copied by Rosalyn Charters on Mon Aug 10, 2012  9:20 AM ------      Message from: Marlowe Shores      Created: Mon Aug 10, 2012  8:58 AM       4 weeks s/p Aortobifem - dickson ------

## 2012-08-10 NOTE — Care Management Note (Signed)
    Page 1 of 1   08/10/2012     3:22:00 PM   CARE MANAGEMENT NOTE 08/10/2012  Patient:  Diana Moreno,Diana Moreno   Account Number:  [id number]  Date Initiated:  08/05/2012  Documentation initiated by:  Alvira Philips Assessment:   37 yr-old female adm with dx of aortic occlusion; lives with roommate, independent PTA     Action/Plan:   Anticipated DC Date:  08/09/2012   Anticipated DC Plan:  HOME W HOME HEALTH SERVICES      DC Planning Services  CM consult  MATCH Program      Choice offered to / List presented to:             Status of service:  Completed, signed off Medicare Important Message given?   (If response is "NO", the following Medicare IM given date fields will be blank) Date Medicare IM given:   Date Additional Medicare IM given:    Discharge Disposition:  HOME/SELF CARE  Per UR Regulation:  Reviewed for med. necessity/level of care/duration of stay  If discussed at Long Length of Stay Meetings, dates discussed:    Comments:  08/10/12 Rosalita Chessman [phone] PT FOR  DC HOME TODAY WITH MOTHER IN [place].  PER DR ROSS, PT TO FOLLOW UP AT [facility] IN [place].  PT ELIGIBLE FOR [place] MATCH PROGRAM.  MATCH LETTER GIVEN WITH EXPLANATION OF PROGRAM BENEFITS.  PT UNCERTAIN HOW LONG SHE WILL BE AT HER MOM'S...OFFERED ASSISSTANCE WITH PCP SET UP, BUT SHE DECLINED HELP, STATING SHE WOULD TAKE CARE OF ON HER OWN.  08/05/12 1059 Henrietta Mayo RN BSN MSN CCM PT/OT recommend home therapies, attempted to discuss same with pt.  Per pt, she has been living with roommate who tried to evict her from apt when she was unable to work. She is unsure @ present whether she will return to the apt or go to [place] to live with her mother.  Pt requested time to decide before setting up home health services.

## 2012-08-10 NOTE — Progress Notes (Signed)
VASCULAR PROGRESS NOTE  SUBJECTIVE: Ambulating, bowel are working, and tolerating regular diet.  PHYSICAL EXAM: Filed Vitals:   08/09/12 0458 08/09/12 1437 08/09/12 1943 08/10/12 0446  BP: 115/77 118/76 99/64 108/72  Pulse: 86 97 93 83  Temp: 98.1 F (36.7 C) 98.4 F (36.9 C) 98.9 F (37.2 C) 98.3 F (36.8 C)  TempSrc: Oral Oral Oral Oral  Resp: 20 18 24 20   Height:      Weight:      SpO2: 94% 95% 96% 96%   Feet warm. Incisions all look fine. Abd: normal pitch BS Lungs: decreased BS at bases  LABS: Lab Results  Component Value Date   WBC 8.9 08/10/2012   HGB 8.3* 08/10/2012   HCT 24.8* 08/10/2012   MCV 85.5 08/10/2012   PLT 304 08/10/2012   Lab Results  Component Value Date   CREATININE 0.53 08/10/2012   Lab Results  Component Value Date   INR 2.56* 08/10/2012   ABI's: Right = 1.0; Left = 0.89  Active Problems:   S/P aortobifemoral bypass surgery   Cardiomyopathy   Left ventricular thrombus   Tobacco abuse   Warfarin anticoagulation   ASSESSMENT AND PLAN: 1. Doing well s/p AFBG. Ready for D/C from our standpoint.  2.Coumadin is therapeutic. Has Clot is ventricle, will need Coumadin for at least 6-12 months. Hypercoag w/u was negative.  3. Appreciate Cardiolgy's help. Home today if ok with Dr. Tenny Craw.    Cari Caraway Beeper: [phone] 08/10/2012

## 2012-08-10 NOTE — Progress Notes (Signed)
Subjective: Patient denies CP  No SOB Objective: Filed Vitals:   08/09/12 0458 08/09/12 1437 08/09/12 1943 08/10/12 0446  BP: 115/77 118/76 99/64 108/72  Pulse: 86 97 93 83  Temp: 98.1 F (36.7 C) 98.4 F (36.9 C) 98.9 F (37.2 C) 98.3 F (36.8 C)  TempSrc: Oral Oral Oral Oral  Resp: 20 18 24 20   Height:      Weight:      SpO2: 94% 95% 96% 96%   Weight change:   Intake/Output Summary (Last 24 hours) at 08/10/12 0734 Last data filed at 08/09/12 1758  Gross per 24 hour  Intake    720 ml  Output      0 ml  Net    720 ml    General: Alert, awake, oriented x3, in no acute distress Neck:  JVP is normal Heart: Regular rate and rhythm, without murmurs, rubs, gallops.  Lungs: Clear to auscultation.  No rales or wheezes. Exemities:  Tr edema.  Dusky gr toe L Neuro: Grossly intact, nonfocal.  Tele:  SR Lab Results: Results for orders placed during the hospital encounter of 08/01/12 (from the past 24 hour(s))  CBC     Status: Abnormal   Collection Time    08/10/12  5:05 AM      Result Value Range   WBC 8.9  4.0 - 10.5 K/uL   RBC 2.90 (*) 3.87 - 5.11 MIL/uL   Hemoglobin 8.3 (*) 12.0 - 15.0 g/dL   HCT 30.8 (*) 65.7 - 84.6 %   MCV 85.5  78.0 - 100.0 fL   MCH 28.6  26.0 - 34.0 pg   MCHC 33.5  30.0 - 36.0 g/dL   RDW 96.2 (*) 95.2 - 84.1 %   Platelets 304  150 - 400 K/uL  PROTIME-INR     Status: Abnormal   Collection Time    08/10/12  5:05 AM      Result Value Range   Prothrombin Time 26.3 (*) 11.6 - 15.2 seconds   INR 2.56 (*) 0.00 - 1.49  BASIC METABOLIC PANEL     Status: Abnormal   Collection Time    08/10/12  5:05 AM      Result Value Range   Sodium 139  135 - 145 mEq/L   Potassium 3.0 (*) 3.5 - 5.1 mEq/L   Chloride 101  96 - 112 mEq/L   CO2 32  19 - 32 mEq/L   Glucose, Bld 104 (*) 70 - 99 mg/dL   BUN 4 (*) 6 - 23 mg/dL   Creatinine, Ser 3.24  0.50 - 1.10 mg/dL   Calcium 8.4  8.4 - 40.1 mg/dL   GFR calc non Af Amer >90  >90 mL/min   GFR calc Af Amer >90  >90  mL/min  LIPID PANEL     Status: Abnormal   Collection Time    08/10/12  5:05 AM      Result Value Range   Cholesterol 136  0 - 200 mg/dL   Triglycerides 027  <253 mg/dL   HDL 20 (*) >66 mg/dL   Total CHOL/HDL Ratio 6.8     VLDL 27  0 - 40 mg/dL   LDL Cholesterol 89  0 - 99 mg/dL    Studies/Results: @RISRSLT24 @  Medications:  Reviewed  1. Cardiomyopathy.  Patient with moderate LV dysfunciton on echo with regional akinesis and clot. Probable ischemic. Volume status is up a little with LE edema but overall looks OK otherwise. She is without active CP  I think it is OK for her to be d/c'd today.  Keep on same medical regimen.  Will titrate as outpatient.  I am not addiing ACE I now as I want to follow her BP.  Do not want hypotension Will make sure she has appt in our coumadin clinic as well as cardiol clinic  2.    LOS: 9 days   Dietrich Pates 08/10/2012, 7:34 AM

## 2012-08-10 NOTE — Progress Notes (Signed)
Central line d/c at this time; pt informed of bedrest; pt to d/c home with mother; will cont. To monitor.

## 2012-08-10 NOTE — Addendum Note (Signed)
Addendum created 08/10/12 1055 by Adair Laundry, CRNA   Modules edited: Anesthesia Medication Administration

## 2012-08-10 NOTE — Progress Notes (Signed)
Pt given d/c instructions; mother at bedside; both verbalized understanding; IV's and tele monitor d/c; will cont. To monitor.

## 2012-08-11 NOTE — Discharge Summary (Signed)
Agree with plans for D/C.  Waverly Ferrari, MD, FACS Beeper [phone] 08/11/2012

## 2012-08-12 ENCOUNTER — Telehealth: Payer: Self-pay

## 2012-08-12 NOTE — Telephone Encounter (Signed)
Pt. Called to report drainage from Left great toe that started today.  States "the drainage is yellow and looks like pus".  Reports that the toe has been inflammed; states "the tip of the toe is purple", and that this is not new.  Denies fever or chills.  Reports has cleaned the toe with alcohol, and covered with a bandaid and a sock.  Discussed with Dr. Edilia Bo.  Recommends pt. to soak left foot with lukewarm water and dial soap 1-2 times/day, to keep it clean, and help to dry-up the sore area.  Instructions given to pt. and her mother.  Advised to call office if symptoms worsen.  Verb. Understanding.

## 2012-08-16 ENCOUNTER — Telehealth (HOSPITAL_COMMUNITY): Payer: Self-pay | Admitting: *Deleted

## 2012-08-16 ENCOUNTER — Encounter (HOSPITAL_COMMUNITY): Payer: Self-pay | Admitting: Nurse Practitioner

## 2012-08-16 ENCOUNTER — Emergency Department (HOSPITAL_COMMUNITY)
Admission: EM | Admit: 2012-08-16 | Discharge: 2012-08-16 | Disposition: A | Discharge status: Home / Self Care | Payer: Self-pay | Attending: Emergency Medicine | Admitting: Emergency Medicine

## 2012-08-16 ENCOUNTER — Emergency Department (HOSPITAL_COMMUNITY): Payer: Self-pay

## 2012-08-16 DIAGNOSIS — K5900 Constipation, unspecified: Secondary | ICD-10-CM | POA: Insufficient documentation

## 2012-08-16 DIAGNOSIS — Z87828 Personal history of other (healed) physical injury and trauma: Secondary | ICD-10-CM | POA: Insufficient documentation

## 2012-08-16 DIAGNOSIS — Z79899 Other long term (current) drug therapy: Secondary | ICD-10-CM | POA: Insufficient documentation

## 2012-08-16 DIAGNOSIS — F3289 Other specified depressive episodes: Secondary | ICD-10-CM | POA: Insufficient documentation

## 2012-08-16 DIAGNOSIS — Z8742 Personal history of other diseases of the female genital tract: Secondary | ICD-10-CM | POA: Insufficient documentation

## 2012-08-16 DIAGNOSIS — R112 Nausea with vomiting, unspecified: Secondary | ICD-10-CM | POA: Insufficient documentation

## 2012-08-16 DIAGNOSIS — F329 Major depressive disorder, single episode, unspecified: Secondary | ICD-10-CM | POA: Insufficient documentation

## 2012-08-16 DIAGNOSIS — Z86718 Personal history of other venous thrombosis and embolism: Secondary | ICD-10-CM | POA: Insufficient documentation

## 2012-08-16 DIAGNOSIS — Z7982 Long term (current) use of aspirin: Secondary | ICD-10-CM | POA: Insufficient documentation

## 2012-08-16 DIAGNOSIS — F17200 Nicotine dependence, unspecified, uncomplicated: Secondary | ICD-10-CM | POA: Insufficient documentation

## 2012-08-16 DIAGNOSIS — Z3202 Encounter for pregnancy test, result negative: Secondary | ICD-10-CM | POA: Insufficient documentation

## 2012-08-16 LAB — COMPREHENSIVE METABOLIC PANEL
BUN: 6 mg/dL (ref 6–23)
CO2: 27 mEq/L (ref 19–32)
Calcium: 10.3 mg/dL (ref 8.4–10.5)
Creatinine, Ser: 0.58 mg/dL (ref 0.50–1.10)
GFR calc Af Amer: >90 mL/min (ref >90)
GFR calc non Af Amer: >90 mL/min (ref >90)
Glucose, Bld: 116 mg/dL — ABNORMAL HIGH (ref 70–99)

## 2012-08-16 LAB — URINALYSIS, MICROSCOPIC ONLY
Bilirubin Urine: NEGATIVE
Leukocytes, UA: NEGATIVE
Nitrite: NEGATIVE
Specific Gravity, Urine: 1.019 (ref 1.005–1.030)
Urobilinogen, UA: 1 mg/dL (ref 0.0–1.0)

## 2012-08-16 LAB — CBC WITH DIFFERENTIAL/PLATELET
Eosinophils Absolute: 0.2 10*3/uL (ref 0.0–0.7)
Eosinophils Relative: 2 % (ref 0–5)
HCT: 33.5 % — ABNORMAL LOW (ref 36.0–46.0)
Lymphocytes Relative: 15 % (ref 12–46)
Lymphs Abs: 1.6 10*3/uL (ref 0.7–4.0)
MCH: 27.4 pg (ref 26.0–34.0)
MCV: 83.5 fL (ref 78.0–100.0)
Monocytes Absolute: 0.6 10*3/uL (ref 0.1–1.0)
RBC: 4.01 MIL/uL (ref 3.87–5.11)
RDW: 15.8 % — ABNORMAL HIGH (ref 11.5–15.5)
WBC: 10.5 10*3/uL (ref 4.0–10.5)

## 2012-08-16 LAB — PROTIME-INR
INR: 3.24 — ABNORMAL HIGH (ref 0.00–1.49)
Prothrombin Time: 31.3 seconds — ABNORMAL HIGH (ref 11.6–15.2)

## 2012-08-16 LAB — APTT: aPTT: 59 seconds — ABNORMAL HIGH (ref 24–37)

## 2012-08-16 MED ORDER — SODIUM CHLORIDE 0.9 % IV SOLN
1000.0000 mL | Freq: Once | INTRAVENOUS | Status: AC
  Administered 2012-08-16: 1000 mL via INTRAVENOUS

## 2012-08-16 MED ORDER — ONDANSETRON HCL 4 MG/2ML IJ SOLN
4.0000 mg | Freq: Once | INTRAMUSCULAR | Status: AC
  Administered 2012-08-16: 4 mg via INTRAVENOUS
  Filled 2012-08-16: qty 2

## 2012-08-16 MED ORDER — HYDROMORPHONE HCL PF 1 MG/ML IJ SOLN
1.0000 mg | Freq: Once | INTRAMUSCULAR | Status: AC
  Administered 2012-08-16: 1 mg via INTRAVENOUS
  Filled 2012-08-16: qty 1

## 2012-08-16 MED ORDER — HYDROCODONE-ACETAMINOPHEN 5-325 MG PO TABS: 1.0000 | ORAL_TABLET | Freq: Four times a day (QID) | ORAL | Status: DC | PRN

## 2012-08-16 MED ORDER — ONDANSETRON 4 MG PO TBDP
8.0000 mg | ORAL_TABLET | Freq: Once | ORAL | Status: AC
  Administered 2012-08-16: 8 mg via ORAL

## 2012-08-16 MED ORDER — ONDANSETRON 8 MG PO TBDP: 8.0000 mg | ORAL_TABLET | Freq: Three times a day (TID) | ORAL | Status: DC | PRN

## 2012-08-16 MED ORDER — SODIUM CHLORIDE 0.9 % IV SOLN
1000.0000 mL | INTRAVENOUS | Status: DC
  Administered 2012-08-16: 1000 mL via INTRAVENOUS

## 2012-08-16 MED ORDER — ONDANSETRON 4 MG PO TBDP
ORAL_TABLET | ORAL | Status: AC
  Filled 2012-08-16: qty 2
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
## 2012-08-16 NOTE — ED Notes (Signed)
Family at bedside. 

## 2012-08-16 NOTE — ED Notes (Signed)
Patient states right lower extremity sensation slightly decreased compared to the left lower extremity.

## 2012-08-16 NOTE — ED Notes (Signed)
Pt had aortic bypass surgery last week and began to have n/v last night and is unable to stop throwing up. Unable to tolerate any oral intake

## 2012-08-17 ENCOUNTER — Telehealth: Payer: Self-pay

## 2012-08-17 NOTE — Telephone Encounter (Signed)
Mother called to discuss pt's. Status.  Reports pt. was seen in the ER on 08/16/12 due to persistent vomiting; "she vomited most of day on Sunday, and throughout the night."  Reports last episode of vomiting happened about 2 hrs ago (approx. 2:30 pm.)  Mother reports pt. had very small BM, "2 rabbit pellet sized stools on 3/28, and 4-5 rabbit pellet sized stools on 3/29."  Denies pt. having any abdominal distension or firmness of abdomen today.  Denies fever.  Pt. reported chills during episode of vomiting.  Took dose of Zofran at 9:00 AM this morning.  Pt. continues to c/o nausea.  Drank some apple juice, and mother states pt. drank larger quantity than she should have, and unable to keep it down.  Advised to have pt. try to take small amts. of clear liquids, or suck on ice chips, or popscicle.  Enc. Pt. To take the Zofran as prescribed.  Advised if symptoms persist with nausea/ vomiting, pt. may need to return to the ER.  Advised mother to watch for increase in abd. distension, abdominal pain, or continued vomiting, and would need to go to ER for further evaluation.  Mother verb. understanding.

## 2012-08-27 ENCOUNTER — Telehealth: Payer: Self-pay

## 2012-08-27 ENCOUNTER — Other Ambulatory Visit: Payer: Self-pay

## 2012-08-27 ENCOUNTER — Telehealth: Payer: Self-pay | Admitting: Vascular Surgery

## 2012-08-27 DIAGNOSIS — G8918 Other acute postprocedural pain: Secondary | ICD-10-CM

## 2012-08-27 DIAGNOSIS — R200 Anesthesia of skin: Secondary | ICD-10-CM

## 2012-08-27 DIAGNOSIS — I998 Other disorder of circulatory system: Secondary | ICD-10-CM

## 2012-08-27 DIAGNOSIS — Z48812 Encounter for surgical aftercare following surgery on the circulatory system: Secondary | ICD-10-CM

## 2012-08-27 DIAGNOSIS — I420 Dilated cardiomyopathy: Secondary | ICD-10-CM

## 2012-08-27 MED ORDER — CARVEDILOL 3.125 MG PO TABS: 3.1250 mg | ORAL_TABLET | Freq: Two times a day (BID) | ORAL | Status: DC

## 2012-08-27 MED ORDER — HYDROCODONE-ACETAMINOPHEN 5-325 MG PO TABS: 1.0000 | ORAL_TABLET | Freq: Four times a day (QID) | ORAL | Status: DC | PRN

## 2012-08-27 NOTE — Telephone Encounter (Signed)
Spoke with pt, confirmed appt - kf °

## 2012-08-27 NOTE — Telephone Encounter (Signed)
Will forward triage note to Dr. Edilia Bo to make aware of pt's. status/ complaints.

## 2012-08-27 NOTE — Telephone Encounter (Signed)
Message copied by Margaretmary Eddy on Thu Aug 27, 2012  2:43 PM ------      Message from: Phillips Odor      Created: Thu Aug 27, 2012 11:19 AM      Regarding: FW: f/u       Please add for ABI's 09/02/12; can you call pt. To notify of new arrival time with ABI's being added.       ----- Message -----         From: Chuck Hint, MD         Sent: 08/27/2012  11:14 AM           To: Conley Simmonds Pullins, RN      Subject: f/u                                                      Noralyn Pick, it sounds like this patient needs to follow up with ABIs next week. Thanks      CD       ------

## 2012-08-27 NOTE — Telephone Encounter (Signed)
Phone call ret'd to pt./ (attempted to call 08/26/12 and not available, so left msg with roommate)  Pt. States since her left great toe is sloughing tissue, she has a lot of pain, especially at night.  Has been soaking with warm water/dial soap daily.  States she decreased soaking to daily, due to her foot drying out.  C/o increased pain at night, and states hasn't slept for about 3 nights, due to pain in her feet.  Describes a burning sensation across the ball of right foot, and across top of right foot, where the toes and foot connect.  States she tries to walk at night due to the pain in her feet, and states " I could fall down, flat on my face, because my legs don't work very well."  c/o calves feeling sore.  States has swelling in left great toe, and right ankle.  Rates pain level at this time at 8/10.  Advised will order Hydrocodone / Acetaminophen 5/325 mg to her pharmacy.

## 2012-09-01 ENCOUNTER — Encounter: Payer: Self-pay | Admitting: Vascular Surgery

## 2012-09-02 ENCOUNTER — Ambulatory Visit: Payer: Self-pay | Admitting: Vascular Surgery

## 2012-09-02 ENCOUNTER — Ambulatory Visit (INDEPENDENT_AMBULATORY_CARE_PROVIDER_SITE_OTHER): Payer: Self-pay | Admitting: Vascular Surgery

## 2012-09-02 ENCOUNTER — Encounter: Payer: Self-pay | Admitting: Vascular Surgery

## 2012-09-02 ENCOUNTER — Encounter (INDEPENDENT_AMBULATORY_CARE_PROVIDER_SITE_OTHER): Payer: Self-pay | Admitting: *Deleted

## 2012-09-02 VITALS — BP 118/80 | HR 79 | Ht 65.0 in | Wt 140.5 lb

## 2012-09-02 DIAGNOSIS — M79609 Pain in unspecified limb: Secondary | ICD-10-CM

## 2012-09-02 DIAGNOSIS — I739 Peripheral vascular disease, unspecified: Secondary | ICD-10-CM

## 2012-09-02 DIAGNOSIS — R200 Anesthesia of skin: Secondary | ICD-10-CM

## 2012-09-02 DIAGNOSIS — I999 Unspecified disorder of circulatory system: Secondary | ICD-10-CM

## 2012-09-02 DIAGNOSIS — Z48812 Encounter for surgical aftercare following surgery on the circulatory system: Secondary | ICD-10-CM

## 2012-09-02 DIAGNOSIS — I998 Other disorder of circulatory system: Secondary | ICD-10-CM

## 2012-09-02 HISTORY — DX: Other disorder of circulatory system: I99.8

## 2012-09-02 MED ORDER — PREGABALIN 75 MG PO CAPS: 75.0000 mg | ORAL_CAPSULE | Freq: Two times a day (BID) | ORAL | Status: DC

## 2012-09-02 NOTE — Progress Notes (Signed)
[facility] POST OPERATIVE OFFICE NOTE  CC:  F/u for surgery  HPI:  This is a 37 y.o. female who is s/p: 1. Exploratory laparotomy  2. Aortobifemoral bypass graft (12 mm x 7 mm Dacron graft)  3. Bilateral femoral embolectomies  4. Bilateral intraoperative arteriograms On 08/02/12.   She presents today and still c/o sharp shooting pains in her right foot.  She states it is like a "cold/burning" sensation.  She also states that she is making progress with her left great toe wound.  She states that she did have severe nausea several days after being discharged and this has now resolved.  She is on coumadin and is followed by Dr. Tenny Craw and has a f/u appt with her tomorrow.  Allergies  Allergen Reactions  . Sulfur Itching    Hives     Current Outpatient Prescriptions  Medication Sig Dispense Refill  . acetaminophen (TYLENOL) 500 MG tablet Take 1,000 mg by mouth every 6 (six) hours as needed for pain or fever.      Marland Kitchen aspirin EC 81 MG EC tablet Take 1 tablet (81 mg total) by mouth daily.      Marland Kitchen atorvastatin (LIPITOR) 40 MG tablet Take 1 tablet (40 mg total) by mouth daily at 6 PM.  60 tablet  1  . calcium carbonate (TUMS - DOSED IN MG ELEMENTAL CALCIUM) 500 MG chewable tablet Chew 1 tablet by mouth daily.      . carvedilol (COREG) 3.125 MG tablet Take 1 tablet (3.125 mg total) by mouth 2 (two) times daily with a meal.  30 tablet  0  . diazepam (VALIUM) 5 MG tablet Take 2.5 mg by mouth 2 (two) times daily as needed for anxiety or sleep.      Marland Kitchen gabapentin (NEURONTIN) 300 MG capsule Take 1 capsule (300 mg total) by mouth 2 (two) times daily.  60 capsule  1  . HYDROcodone-acetaminophen (NORCO) 5-325 MG per tablet Take 1-2 tablets by mouth every 6 (six) hours as needed for pain.  20 tablet  0  . ondansetron (ZOFRAN ODT) 8 MG disintegrating tablet Take 1 tablet (8 mg total) by mouth every 8 (eight) hours as needed for nausea.  20 tablet  0  . oxyCODONE-acetaminophen  (PERCOCET/ROXICET) 5-325 MG per tablet Take 1-2 tablets by mouth every 4 (four) hours as needed.  40 tablet  0  . warfarin (COUMADIN) 5 MG tablet Take 1 tablet (5 mg total) by mouth daily.  30 tablet  0   No current facility-administered medications for this visit.     ROS:  See HPI  Physical Exam:  Filed Vitals:   09/02/12 1533  BP: 118/80  Pulse: 79    Incision:  Laparotomy scar is healing nicely as well as right groin incision.  She has a separation of the proximal portion of the left groin incision with two sutures  Protruding proximally and distally.  Her left groin incision is not infected.  Extremities:  + palpable DP pulse in the right foot.  Her left foot is warm.  There is dry eschar on the tip of the left great toe.  Sensation is diminished on the right foot. 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 The left ABI suggest mild arterial disease using the peroneal artery, however waveforms suggest more severe arterial occlusive disease.   A/P:  This is a 37 y.o. female here for f/u for her aortobifemoral bypass graft on 08/02/12  -pt is doing well, but still complaining of cold, burning pain in her right foot.  She is on neurontin at this time, but will send an Rx to her pharmacy for Lyrica to see if she can get some relief.  She is also given an Rx for OxyIR today as she is getting no relief from the Vicodin that was called in for her.  #30 NR is given. -she does have some separation of her left groin wound.  She is to continue to shower and wash with dial soap and place triple antibiotic ointment to the wound daily and  prn.  -she will continue dial soaks to her left foot for her great toe wound. -f/u with Dr. Edilia Bo in 4 weeks for wound check.  Doreatha Massed, PA-C [facility] [phone]  Clinic MD:  Seen and examined together with Dr. Edilia Bo.  Agree with above. She had presented with a large embolus to her aorta. Her transesophageal echo showed a mobile mass along the anterior posterior wall consistent with thrombus. She is moderately depressed LV function with inferior posterior akinesis. She is on chronic Coumadin therapy.  Waverly Ferrari, MD, FACS Beeper [phone] 09/02/2012

## 2012-09-03 ENCOUNTER — Encounter: Payer: Self-pay | Admitting: Physician Assistant

## 2012-09-03 ENCOUNTER — Ambulatory Visit (INDEPENDENT_AMBULATORY_CARE_PROVIDER_SITE_OTHER): Payer: Self-pay | Admitting: Physician Assistant

## 2012-09-03 ENCOUNTER — Telehealth: Payer: Self-pay | Admitting: *Deleted

## 2012-09-03 ENCOUNTER — Ambulatory Visit (INDEPENDENT_AMBULATORY_CARE_PROVIDER_SITE_OTHER): Payer: Self-pay | Admitting: Pharmacist

## 2012-09-03 VITALS — BP 122/76 | HR 82 | Ht 65.0 in | Wt 142.1 lb

## 2012-09-03 DIAGNOSIS — I428 Other cardiomyopathies: Secondary | ICD-10-CM

## 2012-09-03 DIAGNOSIS — Z7901 Long term (current) use of anticoagulants: Secondary | ICD-10-CM

## 2012-09-03 DIAGNOSIS — I7409 Other arterial embolism and thrombosis of abdominal aorta: Secondary | ICD-10-CM

## 2012-09-03 DIAGNOSIS — I513 Intracardiac thrombosis, not elsewhere classified: Secondary | ICD-10-CM

## 2012-09-03 DIAGNOSIS — F411 Generalized anxiety disorder: Secondary | ICD-10-CM

## 2012-09-03 DIAGNOSIS — R233 Spontaneous ecchymoses: Secondary | ICD-10-CM

## 2012-09-03 DIAGNOSIS — I429 Cardiomyopathy, unspecified: Secondary | ICD-10-CM

## 2012-09-03 DIAGNOSIS — I219 Acute myocardial infarction, unspecified: Secondary | ICD-10-CM

## 2012-09-03 DIAGNOSIS — I5022 Chronic systolic (congestive) heart failure: Secondary | ICD-10-CM

## 2012-09-03 DIAGNOSIS — E785 Hyperlipidemia, unspecified: Secondary | ICD-10-CM

## 2012-09-03 DIAGNOSIS — F419 Anxiety disorder, unspecified: Secondary | ICD-10-CM

## 2012-09-03 LAB — BASIC METABOLIC PANEL
BUN: 3 mg/dL — ABNORMAL LOW (ref 6–23)
Chloride: 101 mEq/L (ref 96–112)
Creatinine, Ser: 0.6 mg/dL (ref 0.4–1.2)

## 2012-09-03 LAB — CBC WITH DIFFERENTIAL/PLATELET
Eosinophils Absolute: 0.2 10*3/uL (ref 0.0–0.7)
Eosinophils Relative: 1.6 % (ref 0.0–5.0)
MCHC: 32.2 g/dL (ref 30.0–36.0)
MCV: 82.3 fl (ref 78.0–100.0)
Monocytes Absolute: 0.7 10*3/uL (ref 0.1–1.0)
Neutrophils Relative %: 62.5 % (ref 43.0–77.0)
Platelets: 332 10*3/uL (ref 150.0–400.0)
WBC: 11.1 10*3/uL — ABNORMAL HIGH (ref 4.5–10.5)

## 2012-09-03 LAB — PROTIME-INR: Prothrombin Time: 106.4 s (ref 10.2–12.4)

## 2012-09-03 LAB — POCT INR: INR: >8

## 2012-09-03 MED ORDER — LISINOPRIL 2.5 MG PO TABS: 2.5000 mg | ORAL_TABLET | Freq: Every day | ORAL | Status: DC
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
## 2012-09-03 NOTE — Patient Instructions (Addendum)
START LISINOPRIL 2.5 MG DAILY; RX SENT IN TODAY  LABS TODAY; BMET, CBC W/DIFF  REPEAT BMET IN 1 WEEK  PLEASE FOLLOW UP WITH DR. ROSS IN 3 WEEKS; IF NOT AVAILABLE THEN WITH [facility]ER, PAC SAME DAY DR. Tenny Craw IS IN THE OFFICE  FASTING LIPID AND LIVER PANEL IN 6 WEEKS  You have been referred to PRIMARY CARE, DX HYPERLIPIDEMIA

## 2012-09-03 NOTE — Telephone Encounter (Signed)
Message copied by Tarri Fuller on Thu Sep 03, 2012  2:50 PM ------      Message from: [place], [place] T      Created: Thu Sep 03, 2012  1:53 PM       Potassium and kidney function look good.      Hgb stable (improved since surgery).  Platelet count normal.      Continue with current treatment plan.      Tereso Newcomer, PA-C  4:49 PM 04/02/2012 ------

## 2012-09-03 NOTE — Telephone Encounter (Signed)
pt notified about lab results with verbal understanding, valium will be handled by PCP , pt sees new PCP Laren Boom 09/07/12.

## 2012-09-07 ENCOUNTER — Ambulatory Visit (INDEPENDENT_AMBULATORY_CARE_PROVIDER_SITE_OTHER): Payer: Self-pay | Admitting: Family Medicine

## 2012-09-07 ENCOUNTER — Encounter: Payer: Self-pay | Admitting: Family Medicine

## 2012-09-07 VITALS — BP 110/75 | HR 99 | Ht 65.0 in | Wt 142.0 lb

## 2012-09-07 DIAGNOSIS — M25579 Pain in unspecified ankle and joints of unspecified foot: Secondary | ICD-10-CM

## 2012-09-07 DIAGNOSIS — G629 Polyneuropathy, unspecified: Secondary | ICD-10-CM

## 2012-09-07 DIAGNOSIS — F411 Generalized anxiety disorder: Secondary | ICD-10-CM

## 2012-09-07 DIAGNOSIS — G589 Mononeuropathy, unspecified: Secondary | ICD-10-CM

## 2012-09-07 DIAGNOSIS — R109 Unspecified abdominal pain: Secondary | ICD-10-CM

## 2012-09-07 DIAGNOSIS — F319 Bipolar disorder, unspecified: Secondary | ICD-10-CM

## 2012-09-07 DIAGNOSIS — F17200 Nicotine dependence, unspecified, uncomplicated: Secondary | ICD-10-CM

## 2012-09-07 DIAGNOSIS — Z7901 Long term (current) use of anticoagulants: Secondary | ICD-10-CM

## 2012-09-07 DIAGNOSIS — Z720 Tobacco use: Secondary | ICD-10-CM

## 2012-09-07 HISTORY — DX: Bipolar disorder, unspecified: F31.9

## 2012-09-07 MED ORDER — DIAZEPAM 5 MG PO TABS: 2.5000 mg | ORAL_TABLET | Freq: Three times a day (TID) | ORAL | Status: DC | PRN
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
## 2012-09-10 ENCOUNTER — Other Ambulatory Visit: Payer: Self-pay

## 2012-09-10 ENCOUNTER — Telehealth: Payer: Self-pay | Admitting: *Deleted

## 2012-09-10 DIAGNOSIS — R29898 Other symptoms and signs involving the musculoskeletal system: Secondary | ICD-10-CM

## 2012-09-10 DIAGNOSIS — R109 Unspecified abdominal pain: Secondary | ICD-10-CM

## 2012-09-10 DIAGNOSIS — F319 Bipolar disorder, unspecified: Secondary | ICD-10-CM

## 2012-09-10 NOTE — Telephone Encounter (Signed)
Pt calls with name of med she was on Viibryd in the past.

## 2012-09-10 NOTE — Telephone Encounter (Signed)
Pt calls back again and states having difficulty setting up transportation and having a hard time getting to PT and doesn't know how long she can continue to make her PT appts. She wants to know if there is anyway she can get PT in the home.

## 2012-09-11 DIAGNOSIS — R29898 Other symptoms and signs involving the musculoskeletal system: Secondary | ICD-10-CM | POA: Insufficient documentation

## 2012-09-11 DIAGNOSIS — R109 Unspecified abdominal pain: Secondary | ICD-10-CM | POA: Insufficient documentation

## 2012-09-11 MED ORDER — VILAZODONE HCL 40 MG PO TABS: 40.0000 mg | ORAL_TABLET | Freq: Every day | ORAL | Status: DC

## 2012-09-11 MED ORDER — VILAZODONE HCL 10 MG PO TABS: 10.0000 mg | ORAL_TABLET | Freq: Every day | ORAL | Status: DC

## 2012-09-11 NOTE — Telephone Encounter (Signed)
Pt notified sample up front 

## 2012-09-11 NOTE — Telephone Encounter (Signed)
Sue Lush, Can you pleae let mrs. Diana Moreno know that I've sent a taper-up regimen of viibryd to her [facility] pharmacy.  Will you also see if Genevieve Norlander can provide her with home health physical therapy with diagnoses of abdominal wall pain and physical deconditioning.

## 2012-09-16 ENCOUNTER — Telehealth: Payer: Self-pay | Admitting: Physician Assistant

## 2012-09-16 NOTE — Telephone Encounter (Signed)
Pt called the answering service with several questions requiring extensive over the phone time. To summarize, she reports recently taking vitamin K for supratherapeutic INR >10 on 09/03/12 at last Coumadin visit in our office. She was advised to follow-up on 09/07/12 for a recheck, but was unable to find transportation. This is a significant obstacle for her in adhering to follow-up appointments at the [facility]. Office specifically. Instead, she followed up with her PCP in [place] as she is able to have a friend provide transportation there. INR was checked there on 09/07/12 and returned at 1.2. She has tried calling the office apparently for further recommendations, but has not received a call back. She has since been taking her prior Coumadin regimen, and is wondering further recommendations. Advised that if transportation is an issue, next best option would be to have her PCP manage this. She can follow-up as soon as tomorrow. Could also have INRs faxed to our office for further recommendations. Will forward to Weston Brass to follow-up.   She also c/o mild dizziness since starting low-dose Lisinopril. Advised to try taking at night to mitigate these symptoms as mortality and cardiac benefit of ACE inhibitor with HFrEF would outweigh this symptom if she can tolerate it. She denies presyncope-type symptoms (weakness, imbalance) or syncope. Will try taking it at night and reassessing this symptom.    Jacqulyn Bath, PA-C 09/16/2012 8:32 PM

## 2012-09-17 ENCOUNTER — Telehealth: Payer: Self-pay | Admitting: *Deleted

## 2012-09-17 NOTE — Telephone Encounter (Signed)
Extremely important for patient to have coumadin followed given her presentation to the hospital recently. Coumadin should be f/u by PCP if that is easier.  Otherwise, needs to be seen here. If possible, see if home health can be ordered to come to her house and check INRs. Ok to try and take Lisinopril at bedtime to see if this helps.  Of note, please make sure patient aware that she should avoid pregnancy with medications she is taking.  I note she is of child bearing age and not sure if this has been discussed with her in the past.   Tereso Newcomer, PA-C  8:55 AM 09/17/2012

## 2012-09-17 NOTE — Telephone Encounter (Signed)
S/w pt already spoke with Dr. Ivan Anchors office about getting INR checked which is a lot closer to pts house and will t/w Dr. Bea Laura about getting INR checked with his office. Pt has appt with Tereso Newcomer on Monday and hopefully pt will t/w sally putt and get something worked out between our office and Dr. Gardner Candle. Pt also aware about not bearing children, pt stated she has other medical issues with pregnancy and is very cautious.

## 2012-09-18 ENCOUNTER — Ambulatory Visit (INDEPENDENT_AMBULATORY_CARE_PROVIDER_SITE_OTHER): Payer: Self-pay | Admitting: Family Medicine

## 2012-09-18 ENCOUNTER — Encounter: Payer: Self-pay | Admitting: Family Medicine

## 2012-09-18 VITALS — BP 101/68 | HR 82 | Wt 138.0 lb

## 2012-09-18 DIAGNOSIS — Z86718 Personal history of other venous thrombosis and embolism: Secondary | ICD-10-CM

## 2012-09-18 DIAGNOSIS — Z7901 Long term (current) use of anticoagulants: Secondary | ICD-10-CM

## 2012-09-18 DIAGNOSIS — M79671 Pain in right foot: Secondary | ICD-10-CM

## 2012-09-18 DIAGNOSIS — I513 Intracardiac thrombosis, not elsewhere classified: Secondary | ICD-10-CM

## 2012-09-18 DIAGNOSIS — M79609 Pain in unspecified limb: Secondary | ICD-10-CM

## 2012-09-18 DIAGNOSIS — Z95828 Presence of other vascular implants and grafts: Secondary | ICD-10-CM

## 2012-09-18 DIAGNOSIS — M79672 Pain in left foot: Secondary | ICD-10-CM

## 2012-09-18 MED ORDER — TRAMADOL HCL 50 MG PO TABS: 50.0000 mg | ORAL_TABLET | Freq: Four times a day (QID) | ORAL | Status: DC | PRN

## 2012-09-18 MED ORDER — WARFARIN SODIUM 3 MG PO TABS: 3.0000 mg | ORAL_TABLET | Freq: Every day | ORAL | Status: DC
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
## 2012-09-21 ENCOUNTER — Encounter: Payer: Self-pay | Admitting: Family Medicine

## 2012-09-21 ENCOUNTER — Ambulatory Visit (INDEPENDENT_AMBULATORY_CARE_PROVIDER_SITE_OTHER): Payer: Self-pay | Admitting: Physician Assistant

## 2012-09-21 ENCOUNTER — Encounter: Payer: Self-pay | Admitting: Physician Assistant

## 2012-09-21 ENCOUNTER — Ambulatory Visit: Payer: Self-pay | Admitting: Physical Therapy

## 2012-09-21 VITALS — BP 90/58 | HR 76 | Ht 65.0 in | Wt 138.4 lb

## 2012-09-21 DIAGNOSIS — I420 Dilated cardiomyopathy: Secondary | ICD-10-CM

## 2012-09-21 DIAGNOSIS — I428 Other cardiomyopathies: Secondary | ICD-10-CM

## 2012-09-21 DIAGNOSIS — I7409 Other arterial embolism and thrombosis of abdominal aorta: Secondary | ICD-10-CM

## 2012-09-21 DIAGNOSIS — I219 Acute myocardial infarction, unspecified: Secondary | ICD-10-CM

## 2012-09-21 DIAGNOSIS — I513 Intracardiac thrombosis, not elsewhere classified: Secondary | ICD-10-CM

## 2012-09-21 DIAGNOSIS — E785 Hyperlipidemia, unspecified: Secondary | ICD-10-CM

## 2012-09-21 LAB — BASIC METABOLIC PANEL
BUN: 6 mg/dL (ref 6–23)
CO2: 26 mEq/L (ref 19–32)
Calcium: 9.3 mg/dL (ref 8.4–10.5)
Creatinine, Ser: 0.6 mg/dL (ref 0.4–1.2)
Glucose, Bld: 85 mg/dL (ref 70–99)

## 2012-09-21 MED ORDER — CARVEDILOL 3.125 MG PO TABS: 3.1250 mg | ORAL_TABLET | Freq: Two times a day (BID) | ORAL | Status: DC

## 2012-09-21 NOTE — Patient Instructions (Addendum)
PLEASE FOLLOW UP WITH DR. ROSS IN ABOUT 2 MONTHS  LAB TODAY; BMET  NO OTHER CHANGES WERE MADE TODAY
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
## 2012-09-22 ENCOUNTER — Telehealth: Payer: Self-pay | Admitting: *Deleted

## 2012-09-22 NOTE — Telephone Encounter (Signed)
pt notified about lab results with verbal understanding today, no changes to be made 

## 2012-09-22 NOTE — Telephone Encounter (Signed)
Message copied by Tarri Fuller on Tue Sep 22, 2012  1:38 PM ------      Message from: [place], [place] T      Created: Tue Sep 22, 2012  1:29 PM       Potassium and kidney function look good.      Continue with current treatment plan.      Tereso Newcomer, PA-C  4:49 PM 04/02/2012 ------

## 2012-09-22 NOTE — Telephone Encounter (Signed)
Message copied by Tarri Fuller on Tue Sep 22, 2012  1:46 PM ------      Message from: [place], [place] T      Created: Tue Sep 22, 2012  1:29 PM       Potassium and kidney function look good.      Continue with current treatment plan.      Tereso Newcomer, PA-C  4:49 PM 04/02/2012 ------

## 2012-09-25 ENCOUNTER — Encounter: Payer: Self-pay | Admitting: Family Medicine

## 2012-09-25 ENCOUNTER — Ambulatory Visit (INDEPENDENT_AMBULATORY_CARE_PROVIDER_SITE_OTHER): Payer: Self-pay | Admitting: Family Medicine

## 2012-09-25 VITALS — BP 99/69 | HR 86 | Wt 138.0 lb

## 2012-09-25 DIAGNOSIS — Z7901 Long term (current) use of anticoagulants: Secondary | ICD-10-CM

## 2012-09-25 DIAGNOSIS — R29898 Other symptoms and signs involving the musculoskeletal system: Secondary | ICD-10-CM

## 2012-09-25 DIAGNOSIS — G589 Mononeuropathy, unspecified: Secondary | ICD-10-CM

## 2012-09-25 DIAGNOSIS — R109 Unspecified abdominal pain: Secondary | ICD-10-CM

## 2012-09-25 DIAGNOSIS — G629 Polyneuropathy, unspecified: Secondary | ICD-10-CM

## 2012-09-25 DIAGNOSIS — M6250 Muscle wasting and atrophy, not elsewhere classified, unspecified site: Secondary | ICD-10-CM

## 2012-09-25 DIAGNOSIS — M25579 Pain in unspecified ankle and joints of unspecified foot: Secondary | ICD-10-CM

## 2012-09-25 NOTE — Patient Instructions (Signed)
1.5 tablets tonight, then only a single tablet a night except for tuesdays and thursdays when you'll take 1.5 tablets.
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
## 2012-09-30 ENCOUNTER — Encounter: Payer: Self-pay | Admitting: Vascular Surgery

## 2012-10-01 ENCOUNTER — Encounter: Payer: Self-pay | Admitting: Vascular Surgery

## 2012-10-01 ENCOUNTER — Ambulatory Visit (INDEPENDENT_AMBULATORY_CARE_PROVIDER_SITE_OTHER): Payer: Self-pay | Admitting: Vascular Surgery

## 2012-10-01 VITALS — BP 98/60 | HR 120 | Resp 18 | Ht 65.0 in | Wt 142.6 lb

## 2012-10-01 DIAGNOSIS — Z9889 Other specified postprocedural states: Secondary | ICD-10-CM

## 2012-10-01 DIAGNOSIS — I739 Peripheral vascular disease, unspecified: Secondary | ICD-10-CM

## 2012-10-01 DIAGNOSIS — Z95828 Presence of other vascular implants and grafts: Secondary | ICD-10-CM

## 2012-10-01 DIAGNOSIS — I999 Unspecified disorder of circulatory system: Secondary | ICD-10-CM

## 2012-10-01 DIAGNOSIS — I998 Other disorder of circulatory system: Secondary | ICD-10-CM

## 2012-10-01 HISTORY — DX: Unspecified disorder of circulatory system: I99.9

## 2012-10-01 HISTORY — DX: Peripheral vascular disease, unspecified: I73.9

## 2012-10-01 MED ORDER — CEPHALEXIN 500 MG PO CAPS: 500.0000 mg | ORAL_CAPSULE | Freq: Three times a day (TID) | ORAL | Status: DC

## 2012-10-01 NOTE — Progress Notes (Signed)
[facility]  Patient name: Diana Moreno MRN: [id number] DOB: 03-18-1976 Sex: female  REASON FOR VISIT: 2 months follow up after aortofemoral bypass graft.  HPI: Diana Moreno is a 37 y.o. female presented with an aortic occlusion and underwent aortofemoral bypass grafting. She was subsequently found to have a large-year-old thrombus in her heart with LV dysfunction and regional akinesis. She has been on Coumadin. She was evaluated by Dr. Arbutus Ped who reviewed her hypercoagulable panel and no significant abnormalities were found to explain her thromboembolic event. Recommended that she be kept on Coumadin for 6-12 months.    REVIEW OF SYSTEMS: Arly.Keller ] denotes positive finding; [  ] denotes negative finding  CARDIOVASCULAR:  [ ]  chest pain   [ ]  dyspnea on exertion    CONSTITUTIONAL:  [ ]  fever   [ ]  chills  PHYSICAL EXAM: Filed Vitals:   10/01/12 1627  BP: 98/60  Pulse: 120  Resp: 18  Height: 5\' 5"  (1.651 m)  Weight: 142 lb 9.6 oz (64.683 kg)   Body mass index is 23.73 kg/(m^2). GENERAL: The patient is a well-nourished female, in no acute distress. The vital signs are documented above. CARDIOVASCULAR: There is a regular rate and rhythm. He has palpable femoral pulses.  PULMONARY: There is good air exchange bilaterally without wheezing or rales. Her incisions are all healed. 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  MEDICAL ISSUES:  S/P aortobifemoral bypass surgery She is 2 months status post aortobifemoral bypass grafting after she had presented with an aortic occlusion. The tip of the left great toe his auto amputating in his dry without significant drainage. However she has some mild erythema and elected to put her on Keflex for 2 weeks with one refill if needed. She continues on her Coumadin. See her back in 6 weeks to check on her toe.   DICKSON,CHRISTOPHER S [facility] [place] Beeper: [phone]

## 2012-10-01 NOTE — Assessment & Plan Note (Signed)
She is 2 months status post aortobifemoral bypass grafting after she had presented with an aortic occlusion. The tip of the left great toe his auto amputating in his dry without significant drainage. However she has some mild erythema and elected to put her on Keflex for 2 weeks with one refill if needed. She continues on her Coumadin. See her back in 6 weeks to check on her toe.

## 2012-10-02 ENCOUNTER — Ambulatory Visit (INDEPENDENT_AMBULATORY_CARE_PROVIDER_SITE_OTHER): Payer: Self-pay | Admitting: Family Medicine

## 2012-10-02 ENCOUNTER — Ambulatory Visit: Payer: Self-pay

## 2012-10-02 VITALS — BP 103/72 | HR 72

## 2012-10-02 DIAGNOSIS — Z7901 Long term (current) use of anticoagulants: Secondary | ICD-10-CM

## 2012-10-02 DIAGNOSIS — I513 Intracardiac thrombosis, not elsewhere classified: Secondary | ICD-10-CM

## 2012-10-02 NOTE — Progress Notes (Signed)
Return one week, see trackin gfor dose chagne.

## 2012-10-02 NOTE — Progress Notes (Signed)
  Subjective:    Patient ID: Diana Moreno, female    DOB: Nov 16, 1975, 37 y.o.   MRN: [id number] Pt in for INR check.  INR is 1.8 today.  Pt states she takes 4.5mg  on tues & thurs; 3mg  all other days. HPI    Review of Systems     Objective:   Physical Exam        Assessment & Plan:

## 2012-10-07 ENCOUNTER — Ambulatory Visit: Payer: Self-pay | Admitting: Vascular Surgery

## 2012-10-15 ENCOUNTER — Telehealth: Payer: Self-pay | Admitting: Family Medicine

## 2012-10-15 ENCOUNTER — Other Ambulatory Visit: Payer: Self-pay

## 2012-10-15 DIAGNOSIS — F411 Generalized anxiety disorder: Secondary | ICD-10-CM

## 2012-10-15 MED ORDER — DIAZEPAM 5 MG PO TABS: 2.5000 mg | ORAL_TABLET | Freq: Three times a day (TID) | ORAL | Status: DC | PRN

## 2012-10-15 NOTE — Telephone Encounter (Signed)
Requesting a refill on valium.

## 2012-10-16 ENCOUNTER — Ambulatory Visit: Payer: Self-pay

## 2012-10-19 ENCOUNTER — Ambulatory Visit: Payer: Self-pay

## 2012-10-20 ENCOUNTER — Other Ambulatory Visit (INDEPENDENT_AMBULATORY_CARE_PROVIDER_SITE_OTHER): Payer: Medicaid Other

## 2012-10-20 DIAGNOSIS — R233 Spontaneous ecchymoses: Secondary | ICD-10-CM

## 2012-10-20 DIAGNOSIS — E785 Hyperlipidemia, unspecified: Secondary | ICD-10-CM | POA: Diagnosis not present

## 2012-10-20 LAB — HEPATIC FUNCTION PANEL
ALT: 13 U/L (ref 0–35)
AST: 17 U/L (ref 0–37)
Albumin: 3.6 g/dL (ref 3.5–5.2)

## 2012-10-20 LAB — BASIC METABOLIC PANEL
BUN: 11 mg/dL (ref 6–23)
Chloride: 111 mEq/L (ref 96–112)
Glucose, Bld: 90 mg/dL (ref 70–99)
Potassium: 4.1 mEq/L (ref 3.5–5.1)

## 2012-10-20 LAB — LIPID PANEL: Cholesterol: 150 mg/dL (ref 0–200)

## 2012-10-21 ENCOUNTER — Telehealth: Payer: Self-pay | Admitting: *Deleted

## 2012-10-21 NOTE — Telephone Encounter (Signed)
Message copied by Tarri Fuller on Wed Oct 21, 2012 12:45 PM ------      Message from: [place], [place] T      Created: Tue Oct 20, 2012  5:26 PM       LFTs okay      Lipids okay      Potassium and creatinine normal      Continue current Rx      Tereso Newcomer, PA-C        10/20/2012 5:26 PM ------

## 2012-10-21 NOTE — Telephone Encounter (Signed)
pt notified about lab results with verbal understanding today.  

## 2012-10-23 ENCOUNTER — Ambulatory Visit: Payer: Self-pay

## 2012-10-23 ENCOUNTER — Ambulatory Visit: Payer: Self-pay | Admitting: *Deleted

## 2012-11-10 ENCOUNTER — Other Ambulatory Visit: Payer: Self-pay

## 2012-11-10 ENCOUNTER — Encounter: Payer: Self-pay | Admitting: Vascular Surgery

## 2012-11-10 MED ORDER — ATORVASTATIN CALCIUM 40 MG PO TABS: 40.0000 mg | ORAL_TABLET | Freq: Every day | ORAL | Status: DC

## 2012-11-11 ENCOUNTER — Encounter: Payer: Self-pay | Admitting: Family Medicine

## 2012-11-11 ENCOUNTER — Ambulatory Visit: Payer: Self-pay | Admitting: Vascular Surgery

## 2012-11-11 ENCOUNTER — Ambulatory Visit (INDEPENDENT_AMBULATORY_CARE_PROVIDER_SITE_OTHER): Payer: Medicaid Other | Admitting: Family Medicine

## 2012-11-11 VITALS — BP 107/75 | HR 70 | Resp 16

## 2012-11-11 DIAGNOSIS — I219 Acute myocardial infarction, unspecified: Secondary | ICD-10-CM | POA: Diagnosis not present

## 2012-11-11 DIAGNOSIS — I513 Intracardiac thrombosis, not elsewhere classified: Secondary | ICD-10-CM

## 2012-11-11 DIAGNOSIS — G589 Mononeuropathy, unspecified: Secondary | ICD-10-CM | POA: Diagnosis not present

## 2012-11-11 DIAGNOSIS — F319 Bipolar disorder, unspecified: Secondary | ICD-10-CM | POA: Diagnosis not present

## 2012-11-11 DIAGNOSIS — G629 Polyneuropathy, unspecified: Secondary | ICD-10-CM

## 2012-11-11 LAB — POCT INR: INR: 1.1
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
## 2012-11-18 ENCOUNTER — Ambulatory Visit (INDEPENDENT_AMBULATORY_CARE_PROVIDER_SITE_OTHER): Payer: Medicaid Other | Admitting: Family Medicine

## 2012-11-18 VITALS — BP 111/70 | HR 67

## 2012-11-18 DIAGNOSIS — Z7901 Long term (current) use of anticoagulants: Secondary | ICD-10-CM | POA: Diagnosis not present

## 2012-11-18 DIAGNOSIS — F411 Generalized anxiety disorder: Secondary | ICD-10-CM

## 2012-11-18 DIAGNOSIS — I513 Intracardiac thrombosis, not elsewhere classified: Secondary | ICD-10-CM

## 2012-11-18 MED ORDER — WARFARIN SODIUM 4 MG PO TABS: 4.0000 mg | ORAL_TABLET | Freq: Every day | ORAL | Status: DC

## 2012-11-18 MED ORDER — DIAZEPAM 5 MG PO TABS: 2.5000 mg | ORAL_TABLET | Freq: Three times a day (TID) | ORAL | Status: DC | PRN

## 2012-11-18 NOTE — Progress Notes (Signed)
Left detailed message on vm with instructions

## 2012-11-18 NOTE — Progress Notes (Signed)
Pt stated that she has 2 missed doses on W,F. She has not had any bleeding or bruising, and she had a salad on yesterday.Loralee Pacas [place]

## 2012-11-18 NOTE — Progress Notes (Signed)
Diana Moreno, Please see Anticoag track instructions.

## 2012-11-25 ENCOUNTER — Telehealth: Payer: Self-pay | Admitting: Internal Medicine

## 2012-11-25 NOTE — Telephone Encounter (Signed)
Spoke with patient and she is out of town and is not suppose to be back until Friday evening. Did encourage patient to keep her appointment Friday

## 2012-11-25 NOTE — Telephone Encounter (Signed)
New Prob    Pt is calling to know the nature of her upcoming appt and what will be discussed at her appt. Please call.

## 2012-11-27 ENCOUNTER — Ambulatory Visit: Payer: Self-pay | Admitting: Internal Medicine

## 2012-12-01 ENCOUNTER — Encounter: Payer: Self-pay | Admitting: Vascular Surgery

## 2012-12-02 ENCOUNTER — Encounter: Payer: Self-pay | Admitting: Vascular Surgery

## 2012-12-02 ENCOUNTER — Other Ambulatory Visit: Payer: Self-pay | Admitting: *Deleted

## 2012-12-02 ENCOUNTER — Ambulatory Visit (INDEPENDENT_AMBULATORY_CARE_PROVIDER_SITE_OTHER): Payer: Self-pay | Admitting: Vascular Surgery

## 2012-12-02 VITALS — BP 125/76 | HR 95 | Resp 16 | Ht 65.0 in | Wt 149.0 lb

## 2012-12-02 DIAGNOSIS — I999 Unspecified disorder of circulatory system: Secondary | ICD-10-CM

## 2012-12-02 DIAGNOSIS — Z48812 Encounter for surgical aftercare following surgery on the circulatory system: Secondary | ICD-10-CM

## 2012-12-02 DIAGNOSIS — I739 Peripheral vascular disease, unspecified: Secondary | ICD-10-CM

## 2012-12-02 DIAGNOSIS — I998 Other disorder of circulatory system: Secondary | ICD-10-CM

## 2012-12-02 NOTE — Progress Notes (Signed)
Vascular and Vein Specialist of [facility]  Patient name: Diana Moreno MRN: [id number] DOB: January 02, 1976 Sex: female  REASON FOR VISIT: follow up after aortobifemoral bypass graft.  HPI: Diana Moreno is a 37 y.o. female who presented with an aortic occlusion and underwent aortobifemoral bypass graft on 07/29/2012. Her hyper quite workup was unremarkable. Hematology recommended 6-12 months of Coumadin. During that admission, echo showed clot attached to the left ventricle. TEE on 08/07/2012 confirmed a mobile mass along the inferior posterior wall consistent with thrombus.  She had developed some dry gangrene on the tip of her left great toe which we have been following and treating conservatively. He also had some paresthesias as her legs have been ischemic for some time before she presented to the emergency department. She states that the paresthesias have improved and she gradually getting better. She's ablating without difficulty.  REVIEW OF SYSTEMS: Arly.Keller ] denotes positive finding; [  ] denotes negative finding  CARDIOVASCULAR:  [ ]  chest pain   [ ]  dyspnea on exertion    CONSTITUTIONAL:  [ ]  fever   [ ]  chills  PHYSICAL EXAM: Filed Vitals:   12/02/12 1243  BP: 125/76  Pulse: 95  Resp: 16  Height: 5\' 5"  (1.651 m)  Weight: 149 lb (67.586 kg)  SpO2: 100%   Body mass index is 24.79 kg/(m^2). GENERAL: The patient is a well-nourished female, in no acute distress. The vital signs are documented above. CARDIOVASCULAR: There is a regular rate and rhythm  PULMONARY: There is good air exchange bilaterally without wheezing or rales. Her incisions are all healed nicely. She has palpable femoral pulses. The left great toe is improved significantly. There is no erythema or drainage.  MEDICAL ISSUES: The patient is doing well status post aortobifemoral bypass grafting. Hematology have recommended 6-12 months of Coumadin and I think he would be reasonable for my standpoint to stop her Coumadin on  05/20/2013. However clearly this also depends upon cardiology's recommendations considering the clot seen on TEE. I plan on seeing her back in 6 months. She knows to call sooner she has problems. Fortunately she has stayed off of the cigarettes. I've encouraged her to stay as active as possible.  Diana Moreno S [facility] [place]: [phone]

## 2012-12-07 ENCOUNTER — Other Ambulatory Visit: Payer: Self-pay | Admitting: *Deleted

## 2012-12-17 DIAGNOSIS — Z0279 Encounter for issue of other medical certificate: Secondary | ICD-10-CM | POA: Diagnosis not present

## 2012-12-25 ENCOUNTER — Ambulatory Visit (INDEPENDENT_AMBULATORY_CARE_PROVIDER_SITE_OTHER): Payer: Medicaid Other | Admitting: Family Medicine

## 2012-12-25 ENCOUNTER — Encounter: Payer: Self-pay | Admitting: Family Medicine

## 2012-12-25 VITALS — BP 113/77 | HR 95 | Wt 157.0 lb

## 2012-12-25 DIAGNOSIS — Z7901 Long term (current) use of anticoagulants: Secondary | ICD-10-CM

## 2012-12-25 DIAGNOSIS — F411 Generalized anxiety disorder: Secondary | ICD-10-CM | POA: Diagnosis not present

## 2012-12-25 DIAGNOSIS — I998 Other disorder of circulatory system: Secondary | ICD-10-CM

## 2012-12-25 DIAGNOSIS — I999 Unspecified disorder of circulatory system: Secondary | ICD-10-CM | POA: Diagnosis not present

## 2012-12-25 DIAGNOSIS — I513 Intracardiac thrombosis, not elsewhere classified: Secondary | ICD-10-CM

## 2012-12-25 DIAGNOSIS — I219 Acute myocardial infarction, unspecified: Secondary | ICD-10-CM | POA: Diagnosis not present

## 2012-12-25 DIAGNOSIS — M79671 Pain in right foot: Secondary | ICD-10-CM

## 2012-12-25 DIAGNOSIS — M79609 Pain in unspecified limb: Secondary | ICD-10-CM

## 2012-12-25 MED ORDER — DIAZEPAM 5 MG PO TABS: 2.5000 mg | ORAL_TABLET | Freq: Three times a day (TID) | ORAL | Status: DC | PRN

## 2012-12-25 MED ORDER — TRAMADOL HCL 50 MG PO TABS: 50.0000 mg | ORAL_TABLET | Freq: Four times a day (QID) | ORAL | Status: DC | PRN

## 2012-12-25 MED ORDER — WARFARIN SODIUM 5 MG PO TABS: ORAL_TABLET | ORAL | Status: DC

## 2012-12-25 MED ORDER — PREGABALIN 75 MG PO CAPS: 75.0000 mg | ORAL_CAPSULE | Freq: Two times a day (BID) | ORAL | Status: DC

## 2012-12-25 MED ORDER — WARFARIN SODIUM 4 MG PO TABS: ORAL_TABLET | ORAL | Status: DC

## 2012-12-25 NOTE — Patient Instructions (Signed)
Coumadin/Warfarin Regimen: Alternate 4mg  and 5mg  every other day.  Repeat INR in one week.
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
## 2012-12-28 ENCOUNTER — Other Ambulatory Visit: Payer: Self-pay | Admitting: *Deleted

## 2012-12-28 MED ORDER — ATORVASTATIN CALCIUM 40 MG PO TABS: 40.0000 mg | ORAL_TABLET | Freq: Every day | ORAL | Status: DC

## 2012-12-30 ENCOUNTER — Telehealth: Payer: Self-pay | Admitting: *Deleted

## 2012-12-30 NOTE — Telephone Encounter (Signed)
Patient called to report that she had fallen on 12-25-12 and injured her right foot. She reports bruising and pain; seen by Dr. Dorothe Pea, who suggested ice, elevation and a postop hard sole shoe. Patient says that the ice and elevation cause increased pain and she wanted to see if this is impacting her circulatory problems. History of aortobifemoral BPG for aortic occlusion on 07-29-12 by Dr. Edilia Bo. Dr. Edilia Bo was consulted and he said for her not to use the ice or elevation as this would constrict her blood vessels and impede her blood flow to her foot even more. He suggests that she use a snug ACE wrap (not tight enough to constrict blood flow), conservative treatment and Tylenol for pain if needed. Dr. Edilia Bo said unfortunately this would take some time to get better and if her foot got worse, she would need to see a orthopedist. Patient voiced understanding and agreement with this plan; she stated that she has no insurance and is trying not to go to ortho if possible.

## 2013-01-01 ENCOUNTER — Ambulatory Visit: Payer: PRIVATE HEALTH INSURANCE

## 2013-01-28 ENCOUNTER — Telehealth: Payer: Self-pay | Admitting: *Deleted

## 2013-01-28 DIAGNOSIS — F411 Generalized anxiety disorder: Secondary | ICD-10-CM

## 2013-01-28 DIAGNOSIS — F319 Bipolar disorder, unspecified: Secondary | ICD-10-CM

## 2013-01-28 MED ORDER — DIAZEPAM 5 MG PO TABS: 2.5000 mg | ORAL_TABLET | Freq: Three times a day (TID) | ORAL | Status: DC | PRN

## 2013-01-28 NOTE — Telephone Encounter (Signed)
Pt is asking about refilling her Vibryd but it has starter pack. Do we need to send it over a starter pack again or something different?  Meyer Cory, LPN

## 2013-01-29 MED ORDER — VILAZODONE HCL 40 MG PO TABS: 40.0000 mg | ORAL_TABLET | Freq: Every day | ORAL | Status: DC

## 2013-01-29 NOTE — Telephone Encounter (Signed)
Diana Moreno, Rx sent to [facility]

## 2013-01-29 NOTE — Telephone Encounter (Addendum)
Sue Lush, Will you let Sagal know that she's in luck, the Viibryd rep visited [place] this morning. We only have the starter packs but she can use any combination of these pills to get 40mg  daily.  If she wants to try stretching these out I'd encourage her to try a total of 20mg  daily and see if that still provides her relief. Samples placed up front.

## 2013-01-29 NOTE — Addendum Note (Signed)
Addended by: Laren Boom on: 01/29/2013 07:51 AM   Modules accepted: Orders

## 2013-01-29 NOTE — Telephone Encounter (Signed)
Pt states she actually wanted to know if she could have more samples and if the only samples were starter packs, how could she take the medication provided in the starter pak? Right now she states the medication is too expensive

## 2013-01-29 NOTE — Telephone Encounter (Signed)
Pt.notified

## 2013-02-02 ENCOUNTER — Ambulatory Visit (INDEPENDENT_AMBULATORY_CARE_PROVIDER_SITE_OTHER): Payer: Medicaid Other | Admitting: Family Medicine

## 2013-02-02 ENCOUNTER — Encounter: Payer: Self-pay | Admitting: *Deleted

## 2013-02-02 VITALS — BP 121/77 | HR 106

## 2013-02-02 DIAGNOSIS — I513 Intracardiac thrombosis, not elsewhere classified: Secondary | ICD-10-CM

## 2013-02-02 DIAGNOSIS — I219 Acute myocardial infarction, unspecified: Secondary | ICD-10-CM

## 2013-02-02 DIAGNOSIS — Z7901 Long term (current) use of anticoagulants: Secondary | ICD-10-CM | POA: Diagnosis not present

## 2013-02-02 LAB — POCT INR: INR: 1.7

## 2013-02-02 NOTE — Progress Notes (Signed)
Awating INR from lab prior to changing current regimen of coumadin.

## 2013-02-03 ENCOUNTER — Telehealth: Payer: Self-pay | Admitting: Family Medicine

## 2013-02-03 DIAGNOSIS — I513 Intracardiac thrombosis, not elsewhere classified: Secondary | ICD-10-CM

## 2013-02-03 LAB — CBC
Hemoglobin: 11.6 g/dL — ABNORMAL LOW (ref 12.0–15.0)
MCHC: 31.4 g/dL (ref 30.0–36.0)
RBC: 4.41 MIL/uL (ref 3.87–5.11)
WBC: 15.8 10*3/uL — ABNORMAL HIGH (ref 4.0–10.5)

## 2013-02-03 LAB — PROTIME-INR
INR: 1.57 — ABNORMAL HIGH (ref <1.50)
Prothrombin Time: 18.4 seconds — ABNORMAL HIGH (ref 11.6–15.2)

## 2013-02-03 MED ORDER — WARFARIN SODIUM 5 MG PO TABS: ORAL_TABLET | ORAL | Status: DC

## 2013-02-03 NOTE — Telephone Encounter (Signed)
Diana Moreno, Can you please let Mitzie know that her lab INR value was 1.6, I'd encourage her to now take coumadin 5mg  daily, refills were sent to [facility] in [place].  Repeat INR in one week.

## 2013-02-03 NOTE — Telephone Encounter (Signed)
Called and voice mail for home num not set up unable to leave a message and vm for cell was a different name listed

## 2013-02-04 NOTE — Telephone Encounter (Signed)
Spoke with pt & notified her of her results.  Gave her new dosing instructions & advised her to recheck INR in one week.

## 2013-02-04 NOTE — Progress Notes (Signed)
  Subjective:    Patient ID: Diana Moreno, female    DOB: 10-30-1975, 37 y.o.   MRN: [id number] Pt notified of new dosing instructions. Donne Anon, CMA HPI    Review of Systems     Objective:   Physical Exam        Assessment & Plan:

## 2013-02-05 ENCOUNTER — Ambulatory Visit (INDEPENDENT_AMBULATORY_CARE_PROVIDER_SITE_OTHER): Payer: Medicaid Other | Admitting: Internal Medicine

## 2013-02-05 ENCOUNTER — Encounter: Payer: Self-pay | Admitting: Internal Medicine

## 2013-02-05 VITALS — BP 124/84 | HR 83 | Ht 65.0 in | Wt 161.0 lb

## 2013-02-05 DIAGNOSIS — I509 Heart failure, unspecified: Secondary | ICD-10-CM

## 2013-02-05 DIAGNOSIS — E785 Hyperlipidemia, unspecified: Secondary | ICD-10-CM

## 2013-02-05 LAB — BASIC METABOLIC PANEL
BUN: 6 mg/dL (ref 6–23)
Calcium: 9 mg/dL (ref 8.4–10.5)
Creatinine, Ser: 0.8 mg/dL (ref 0.4–1.2)
GFR: 92.63 mL/min (ref >60.00)
Glucose, Bld: 86 mg/dL (ref 70–99)

## 2013-02-05 LAB — LIPID PANEL
HDL: 57.5 mg/dL (ref >39.00)
Total CHOL/HDL Ratio: 3

## 2013-02-05 LAB — AST: AST: 19 U/L (ref 0–37)
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
## 2013-02-05 NOTE — Patient Instructions (Addendum)
Lab Work today    Your physician has requested that you have an echocardiogram in 12/14. Echocardiography is a painless test that uses sound waves to create images of your heart. It provides your doctor with information about the size and shape of your heart and how well your heart's chambers and valves are working. This procedure takes approximately one hour. There are no restrictions for this procedure.     Your physician wants you to follow-up in: 2/15. You will receive a reminder letter in the mail two months in advance. If you don't receive a letter, please call our office to schedule the follow-up appointment.

## 2013-02-08 ENCOUNTER — Other Ambulatory Visit: Payer: Self-pay | Admitting: *Deleted

## 2013-02-08 DIAGNOSIS — E7800 Pure hypercholesterolemia, unspecified: Secondary | ICD-10-CM

## 2013-02-17 ENCOUNTER — Encounter (HOSPITAL_COMMUNITY): Payer: Self-pay | Admitting: Licensed Clinical Social Worker

## 2013-02-17 ENCOUNTER — Ambulatory Visit (HOSPITAL_COMMUNITY): Payer: Self-pay | Admitting: Licensed Clinical Social Worker
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
## 2013-02-19 ENCOUNTER — Telehealth: Payer: Self-pay

## 2013-02-19 NOTE — Telephone Encounter (Signed)
Left message on pt's vm and samples left up front. Asked pt to call and get on the nurse visit schedule for INR

## 2013-02-19 NOTE — Telephone Encounter (Signed)
Can she have samples of Lyrica?

## 2013-02-19 NOTE — Telephone Encounter (Signed)
Yes I'll put them on our desk. She is overdue for repeat INR

## 2013-02-26 ENCOUNTER — Ambulatory Visit: Payer: Self-pay | Admitting: *Deleted

## 2013-03-03 ENCOUNTER — Ambulatory Visit (HOSPITAL_COMMUNITY): Payer: Self-pay | Admitting: Licensed Clinical Social Worker

## 2013-03-11 ENCOUNTER — Ambulatory Visit (HOSPITAL_COMMUNITY): Payer: PRIVATE HEALTH INSURANCE | Admitting: Licensed Clinical Social Worker

## 2013-03-18 ENCOUNTER — Ambulatory Visit (HOSPITAL_COMMUNITY): Payer: Self-pay | Admitting: Licensed Clinical Social Worker

## 2013-03-25 ENCOUNTER — Other Ambulatory Visit: Payer: Self-pay

## 2013-03-31 ENCOUNTER — Ambulatory Visit (HOSPITAL_COMMUNITY): Payer: Self-pay | Admitting: Licensed Clinical Social Worker

## 2013-03-31 ENCOUNTER — Encounter: Payer: Self-pay | Admitting: Family Medicine

## 2013-03-31 ENCOUNTER — Ambulatory Visit (INDEPENDENT_AMBULATORY_CARE_PROVIDER_SITE_OTHER): Payer: Medicaid Other | Admitting: Family Medicine

## 2013-03-31 VITALS — BP 144/95 | HR 90 | Wt 159.0 lb

## 2013-03-31 DIAGNOSIS — F411 Generalized anxiety disorder: Secondary | ICD-10-CM | POA: Diagnosis not present

## 2013-03-31 DIAGNOSIS — G589 Mononeuropathy, unspecified: Secondary | ICD-10-CM

## 2013-03-31 DIAGNOSIS — I219 Acute myocardial infarction, unspecified: Secondary | ICD-10-CM | POA: Diagnosis not present

## 2013-03-31 DIAGNOSIS — L600 Ingrowing nail: Secondary | ICD-10-CM

## 2013-03-31 DIAGNOSIS — I739 Peripheral vascular disease, unspecified: Secondary | ICD-10-CM | POA: Diagnosis not present

## 2013-03-31 DIAGNOSIS — G629 Polyneuropathy, unspecified: Secondary | ICD-10-CM

## 2013-03-31 DIAGNOSIS — Z7901 Long term (current) use of anticoagulants: Secondary | ICD-10-CM

## 2013-03-31 DIAGNOSIS — I513 Intracardiac thrombosis, not elsewhere classified: Secondary | ICD-10-CM

## 2013-03-31 DIAGNOSIS — M79609 Pain in unspecified limb: Secondary | ICD-10-CM

## 2013-03-31 DIAGNOSIS — M79671 Pain in right foot: Secondary | ICD-10-CM

## 2013-03-31 LAB — POCT INR: INR: 1.5

## 2013-03-31 MED ORDER — TRAMADOL HCL 50 MG PO TABS: 50.0000 mg | ORAL_TABLET | Freq: Four times a day (QID) | ORAL | Status: DC | PRN

## 2013-03-31 MED ORDER — CLONAZEPAM 0.5 MG PO TABS: 0.5000 mg | ORAL_TABLET | Freq: Two times a day (BID) | ORAL | Status: DC | PRN

## 2013-03-31 MED ORDER — WARFARIN SODIUM 6 MG PO TABS: 6.0000 mg | ORAL_TABLET | Freq: Every day | ORAL | Status: DC

## 2013-03-31 MED ORDER — AMOXICILLIN-POT CLAVULANATE 500-125 MG PO TABS: ORAL_TABLET | ORAL | Status: AC
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
## 2013-04-05 ENCOUNTER — Ambulatory Visit (HOSPITAL_COMMUNITY): Payer: Self-pay | Admitting: Licensed Clinical Social Worker

## 2013-04-07 ENCOUNTER — Telehealth: Payer: Self-pay | Admitting: *Deleted

## 2013-04-07 NOTE — Telephone Encounter (Signed)
Pt called and states she needed Dr. Ivan Anchors or his nurse to call back asap but didn't say why or what she needed. Called pt back and left a vm stating to leave a detailed message as to what she needed so that her call could be addressed in a timely manner

## 2013-04-08 ENCOUNTER — Ambulatory Visit (HOSPITAL_COMMUNITY): Payer: PRIVATE HEALTH INSURANCE | Admitting: Licensed Clinical Social Worker

## 2013-04-12 ENCOUNTER — Ambulatory Visit: Payer: Self-pay | Admitting: *Deleted

## 2013-04-23 ENCOUNTER — Ambulatory Visit: Payer: Self-pay | Admitting: *Deleted

## 2013-04-30 ENCOUNTER — Encounter: Payer: Self-pay | Admitting: Internal Medicine

## 2013-04-30 ENCOUNTER — Telehealth: Payer: Self-pay | Admitting: *Deleted

## 2013-04-30 ENCOUNTER — Ambulatory Visit (HOSPITAL_COMMUNITY): Payer: Medicaid Other | Attending: Internal Medicine | Admitting: Radiology

## 2013-04-30 ENCOUNTER — Telehealth: Payer: Self-pay | Admitting: Internal Medicine

## 2013-04-30 DIAGNOSIS — F17200 Nicotine dependence, unspecified, uncomplicated: Secondary | ICD-10-CM | POA: Insufficient documentation

## 2013-04-30 DIAGNOSIS — Z7901 Long term (current) use of anticoagulants: Secondary | ICD-10-CM

## 2013-04-30 DIAGNOSIS — E785 Hyperlipidemia, unspecified: Secondary | ICD-10-CM

## 2013-04-30 DIAGNOSIS — F411 Generalized anxiety disorder: Secondary | ICD-10-CM

## 2013-04-30 DIAGNOSIS — I739 Peripheral vascular disease, unspecified: Secondary | ICD-10-CM | POA: Insufficient documentation

## 2013-04-30 DIAGNOSIS — I509 Heart failure, unspecified: Secondary | ICD-10-CM | POA: Diagnosis not present

## 2013-04-30 DIAGNOSIS — R0602 Shortness of breath: Secondary | ICD-10-CM | POA: Insufficient documentation

## 2013-04-30 DIAGNOSIS — I513 Intracardiac thrombosis, not elsewhere classified: Secondary | ICD-10-CM

## 2013-04-30 NOTE — Telephone Encounter (Signed)
Patient wants you to call her regarding a note for her disability attorney.  She said that he required documentation regarding her current health issues that would support her disability claim.  She is here now having her echocardiogram.  Please call.

## 2013-04-30 NOTE — Telephone Encounter (Signed)
Pt is asking if we can increase her dose of Klonopin. She also states that the test you wanted her to have done with PT cost over $1000 and states she can't afford that. She is asking if you can write a letter for her stating that she will be out of work for at least 12 months. I informed her I don't know the length of time you would put but I would ask you about the letter.  Meyer Cory, LPN

## 2013-04-30 NOTE — Progress Notes (Signed)
Echocardiogram performed.  

## 2013-04-30 NOTE — Telephone Encounter (Signed)
Spoke with pt, she is not sure what the note needs to say. Her PCP said she should not be on her feet more than 10 mion because they have been unable to get her INR therapeutic. She thinks it needs to say she is ineligible to work for at least one year. Will forward to dr Tenny Craw

## 2013-05-03 ENCOUNTER — Telehealth: Payer: Self-pay | Admitting: *Deleted

## 2013-05-03 MED ORDER — CLONAZEPAM 1 MG PO TABS: 1.0000 mg | ORAL_TABLET | Freq: Two times a day (BID) | ORAL | Status: DC | PRN

## 2013-05-03 NOTE — Telephone Encounter (Signed)
New Messafe  Pt recently had an ECHO// Calling for results// Please call back to discuss

## 2013-05-03 NOTE — Telephone Encounter (Signed)
Sue Lush, Will you please let Mrs. Magar know that I've got a new klonopin rx for her, placed in your inbox.  I don't handle leave of absences for that long, my decisions only span from days to weeks.  Also, I need Mrs. Ellenberger to start sticking to a more structured INR follow up.  Please return for an INR ASAP, then weekly until we have two consecutive INRs between 2-3.

## 2013-05-03 NOTE — Telephone Encounter (Signed)
Pt called and left a message on vm stating that Dr. Ivan Anchors needed to "do his job because she is the patient and she is the priority. So he needs to make her a whole person and write her out of work." I called pt back and left her a vm stating that Legally for disability Dr. Ivan Anchors cannot write her out of work for the length of time she requested.He does not have the qualifications to do so. I said the next step would be to do the functional capacity test that he reccomended. I know that she states she cannot afford that test but without that documentation provided by that specialist Dr. Ivan Anchors does not have anything to base her disability claim on.

## 2013-05-03 NOTE — Telephone Encounter (Signed)
Spoke with pt, aware echo has not been seen by dr Tenny Craw yet. Will call with results. Patient voiced understanding

## 2013-05-03 NOTE — Telephone Encounter (Signed)
Pt.notified

## 2013-05-05 NOTE — Telephone Encounter (Signed)
Spoke with pt, aware of echo results. Aware dr Tenny Craw to call her regarding this test. Patient voiced understanding

## 2013-05-05 NOTE — Telephone Encounter (Signed)
Echo should support request for disability -- permanent.

## 2013-05-07 ENCOUNTER — Telehealth: Payer: Self-pay | Admitting: Internal Medicine

## 2013-05-07 NOTE — Telephone Encounter (Signed)
Dr Tenny Craw called and spoke with the pt.

## 2013-05-07 NOTE — Telephone Encounter (Signed)
Follow Up:  Pt is calling back in reference to her Echo. Pt would like a call back.

## 2013-05-07 NOTE — Telephone Encounter (Signed)
Reviewed with patient  Would set up for L heart cath to define anatomy Will need pre labs and coumadin eval for bridging.

## 2013-05-07 NOTE — Telephone Encounter (Signed)
New message     Pt want echo results---she is nervous and anxious.  Pls call today if possible. She does not want to "worry" thru the weekend

## 2013-05-12 NOTE — Telephone Encounter (Signed)
Left message for pt to call, she has her coumadin managed by her PCP. She will need to come here for lovenox bridging for the cath. Per sally, pharm md, the pt will need to be seen about 7 days prior to the cath to get the lovenox arranged. Need to find out when the pt wants to have the cath.

## 2013-05-18 ENCOUNTER — Encounter: Payer: Self-pay | Admitting: Nurse Practitioner

## 2013-05-18 ENCOUNTER — Telehealth: Payer: Self-pay | Admitting: Internal Medicine

## 2013-05-18 NOTE — Telephone Encounter (Signed)
Spoke with patient who called to notify us of her new phone number; states she did not receive Debra's message from last week.  Patient scheduled for left heart cath on 1/16 with Dr. Excell Seltzer at 11:30.  Patient scheduled for appointment with Audrie Lia, PharmD for Lovenox bridging and pre-cath labs for 1/9.  Patient aware of all appointments and I read her pre-cath instructions to her on the telephone and mailed her a copy.  Patient verbalized understanding of all instructions.

## 2013-05-18 NOTE — Telephone Encounter (Signed)
New message    Phone died days ago----want Diana Moreno to call her on this number to set up test.

## 2013-05-24 ENCOUNTER — Encounter: Payer: Self-pay | Admitting: Internal Medicine

## 2013-05-24 NOTE — Telephone Encounter (Signed)
Spoke with pt, dr Tenny Craw had agreed to write a letter for the pt for disability and she is wondering if complete. Will discuss with dr Tenny Craw

## 2013-05-24 NOTE — Telephone Encounter (Signed)
Letter written

## 2013-05-24 NOTE — Telephone Encounter (Signed)
Would like for Diana Moreno to give her a call back today, she was advised by Marcelino Duster she needed to speak with Diana Moreno. Please call.

## 2013-05-25 ENCOUNTER — Encounter: Payer: Self-pay | Admitting: *Deleted

## 2013-05-25 NOTE — Telephone Encounter (Signed)
Spoke with pt, aware disability letter is ready. She requested we fax it to samatha at [employer] @ [phone]. Letter placed in medical records for faxing.

## 2013-05-27 ENCOUNTER — Telehealth: Payer: Self-pay | Admitting: Internal Medicine

## 2013-05-27 NOTE — Telephone Encounter (Signed)
Spoke with pt, letter re faxed to the number provided

## 2013-05-27 NOTE — Telephone Encounter (Signed)
Spoke with pt, aware disability letter complete. Letter faxed to samatha at [employer] @ [phone] per pt request.

## 2013-05-27 NOTE — Telephone Encounter (Signed)
New problem:  Pt is requesting a call back from Diana Moreno. Wants to give the nurse this information...   [phone] Fax number for Lawyer at [facility]

## 2013-05-28 ENCOUNTER — Ambulatory Visit (INDEPENDENT_AMBULATORY_CARE_PROVIDER_SITE_OTHER): Payer: Medicaid Other | Admitting: Pharmacist

## 2013-05-28 ENCOUNTER — Encounter: Payer: Self-pay | Admitting: Pharmacist

## 2013-05-28 ENCOUNTER — Ambulatory Visit (INDEPENDENT_AMBULATORY_CARE_PROVIDER_SITE_OTHER): Payer: Medicaid Other | Admitting: *Deleted

## 2013-05-28 VITALS — Wt 157.8 lb

## 2013-05-28 DIAGNOSIS — I219 Acute myocardial infarction, unspecified: Secondary | ICD-10-CM | POA: Diagnosis not present

## 2013-05-28 DIAGNOSIS — I998 Other disorder of circulatory system: Secondary | ICD-10-CM

## 2013-05-28 DIAGNOSIS — I513 Intracardiac thrombosis, not elsewhere classified: Secondary | ICD-10-CM

## 2013-05-28 DIAGNOSIS — Z7901 Long term (current) use of anticoagulants: Secondary | ICD-10-CM | POA: Diagnosis not present

## 2013-05-28 DIAGNOSIS — I428 Other cardiomyopathies: Secondary | ICD-10-CM | POA: Diagnosis not present

## 2013-05-28 DIAGNOSIS — I999 Unspecified disorder of circulatory system: Secondary | ICD-10-CM

## 2013-05-28 DIAGNOSIS — I739 Peripheral vascular disease, unspecified: Secondary | ICD-10-CM | POA: Diagnosis not present

## 2013-05-28 DIAGNOSIS — I429 Cardiomyopathy, unspecified: Secondary | ICD-10-CM

## 2013-05-28 LAB — CBC WITH DIFFERENTIAL/PLATELET
BASOS ABS: 0.1 10*3/uL (ref 0.0–0.1)
Basophils Relative: 0.4 % (ref 0.0–3.0)
EOS PCT: 0.8 % (ref 0.0–5.0)
Eosinophils Absolute: 0.2 10*3/uL (ref 0.0–0.7)
HCT: 38.4 % (ref 36.0–46.0)
HEMOGLOBIN: 12.5 g/dL (ref 12.0–15.0)
LYMPHS PCT: 18 % (ref 12.0–46.0)
Lymphs Abs: 3.5 10*3/uL (ref 0.7–4.0)
MCHC: 32.5 g/dL (ref 30.0–36.0)
MCV: 83.9 fl (ref 78.0–100.0)
Monocytes Absolute: 0.9 10*3/uL (ref 0.1–1.0)
Monocytes Relative: 4.8 % (ref 3.0–12.0)
Neutro Abs: 14.7 10*3/uL — ABNORMAL HIGH (ref 1.4–7.7)
Neutrophils Relative %: 76 % (ref 43.0–77.0)
PLATELETS: 279 10*3/uL (ref 150.0–400.0)
RBC: 4.58 Mil/uL (ref 3.87–5.11)
RDW: 19.1 % — AB (ref 11.5–14.6)
WBC: 19.4 10*3/uL — AB (ref 4.5–10.5)

## 2013-05-28 LAB — BASIC METABOLIC PANEL
BUN: 8 mg/dL (ref 6–23)
CALCIUM: 8.9 mg/dL (ref 8.4–10.5)
CO2: 23 mEq/L (ref 19–32)
Chloride: 107 mEq/L (ref 96–112)
Creatinine, Ser: 0.8 mg/dL (ref 0.4–1.2)
GFR: 92.47 mL/min (ref >60.00)
Glucose, Bld: 84 mg/dL (ref 70–99)
Potassium: 3.8 mEq/L (ref 3.5–5.1)
Sodium: 137 mEq/L (ref 135–145)

## 2013-05-28 LAB — PROTIME-INR
INR: 2.5 ratio — ABNORMAL HIGH (ref 0.8–1.0)
Prothrombin Time: 25.5 s — ABNORMAL HIGH (ref 10.2–12.4)

## 2013-05-28 LAB — POCT INR: INR: 2.2

## 2013-05-28 LAB — APTT: APTT: 31.5 s — AB (ref 21.7–28.8)

## 2013-06-01 ENCOUNTER — Encounter: Payer: Self-pay | Admitting: Vascular Surgery

## 2013-06-02 ENCOUNTER — Encounter: Payer: Self-pay | Admitting: Vascular Surgery

## 2013-06-02 ENCOUNTER — Ambulatory Visit (HOSPITAL_COMMUNITY)
Admission: RE | Admit: 2013-06-02 | Discharge: 2013-06-02 | Disposition: A | Discharge status: Home / Self Care | Payer: Medicaid Other | Source: Ambulatory Visit | Attending: Vascular Surgery | Admitting: Vascular Surgery

## 2013-06-02 ENCOUNTER — Ambulatory Visit (INDEPENDENT_AMBULATORY_CARE_PROVIDER_SITE_OTHER): Payer: Medicaid Other | Admitting: Vascular Surgery

## 2013-06-02 VITALS — BP 117/72 | HR 82 | Ht 65.0 in | Wt 157.6 lb

## 2013-06-02 DIAGNOSIS — I739 Peripheral vascular disease, unspecified: Secondary | ICD-10-CM

## 2013-06-02 DIAGNOSIS — Z48812 Encounter for surgical aftercare following surgery on the circulatory system: Secondary | ICD-10-CM

## 2013-06-02 DIAGNOSIS — E785 Hyperlipidemia, unspecified: Secondary | ICD-10-CM | POA: Insufficient documentation

## 2013-06-02 DIAGNOSIS — I70209 Unspecified atherosclerosis of native arteries of extremities, unspecified extremity: Secondary | ICD-10-CM | POA: Insufficient documentation

## 2013-06-02 DIAGNOSIS — I10 Essential (primary) hypertension: Secondary | ICD-10-CM | POA: Insufficient documentation

## 2013-06-02 DIAGNOSIS — M79609 Pain in unspecified limb: Secondary | ICD-10-CM | POA: Insufficient documentation

## 2013-06-02 DIAGNOSIS — R209 Unspecified disturbances of skin sensation: Secondary | ICD-10-CM | POA: Insufficient documentation

## 2013-06-02 DIAGNOSIS — Z95828 Presence of other vascular implants and grafts: Secondary | ICD-10-CM

## 2013-06-02 DIAGNOSIS — Z87891 Personal history of nicotine dependence: Secondary | ICD-10-CM | POA: Insufficient documentation

## 2013-06-02 HISTORY — DX: Peripheral vascular disease, unspecified: I73.9

## 2013-06-02 NOTE — Assessment & Plan Note (Signed)
Her aortobifemoral bypass graft remains patent. Her ABIs are stable. She had embolic disease to both feet and her feet appeared to to improved slowly. During her a daily admission, her hypercoagulable workup was negative. Hematology have recommended Coumadin for 6-12 months. Therefore, the decision about whether or not to continue Coumadin will depend upon cardiology's recommendations.  I plan on seeing her back in one year with follow up ABIs. She knows to call sooner if she has problems. Of note, she had quit smoking, but unfortunately has begun to smoke some began. I have again stressed with her the importance of quitting to help maintain patency of her graft and also help with her ischemic toes.
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
## 2013-06-04 ENCOUNTER — Ambulatory Visit (HOSPITAL_COMMUNITY)
Admission: RE | Admit: 2013-06-04 | Discharge: 2013-06-04 | Disposition: A | Discharge status: Home / Self Care | Payer: Self-pay | Source: Ambulatory Visit | Attending: Cardiovascular Disease | Admitting: Cardiovascular Disease

## 2013-06-04 ENCOUNTER — Encounter (HOSPITAL_COMMUNITY)
Admission: RE | Disposition: A | Discharge status: Home / Self Care | Payer: Self-pay | Source: Ambulatory Visit | Attending: Cardiovascular Disease

## 2013-06-04 ENCOUNTER — Telehealth: Payer: Self-pay | Admitting: Family Medicine

## 2013-06-04 DIAGNOSIS — I2582 Chronic total occlusion of coronary artery: Secondary | ICD-10-CM | POA: Insufficient documentation

## 2013-06-04 DIAGNOSIS — I428 Other cardiomyopathies: Secondary | ICD-10-CM | POA: Insufficient documentation

## 2013-06-04 DIAGNOSIS — I2510 Atherosclerotic heart disease of native coronary artery without angina pectoris: Secondary | ICD-10-CM | POA: Insufficient documentation

## 2013-06-04 DIAGNOSIS — Z7901 Long term (current) use of anticoagulants: Secondary | ICD-10-CM | POA: Insufficient documentation

## 2013-06-04 DIAGNOSIS — Z7982 Long term (current) use of aspirin: Secondary | ICD-10-CM | POA: Insufficient documentation

## 2013-06-04 DIAGNOSIS — I5022 Chronic systolic (congestive) heart failure: Secondary | ICD-10-CM | POA: Insufficient documentation

## 2013-06-04 DIAGNOSIS — F411 Generalized anxiety disorder: Secondary | ICD-10-CM

## 2013-06-04 DIAGNOSIS — F319 Bipolar disorder, unspecified: Secondary | ICD-10-CM

## 2013-06-04 DIAGNOSIS — I998 Other disorder of circulatory system: Secondary | ICD-10-CM

## 2013-06-04 HISTORY — PX: LEFT HEART CATHETERIZATION WITH CORONARY ANGIOGRAM: SHX5451

## 2013-06-04 LAB — CBC
HCT: 38.8 % (ref 36.0–46.0)
Hemoglobin: 12.8 g/dL (ref 12.0–15.0)
MCH: 27.9 pg (ref 26.0–34.0)
MCHC: 33 g/dL (ref 30.0–36.0)
MCV: 84.5 fL (ref 78.0–100.0)
PLATELETS: 287 10*3/uL (ref 150–400)
RBC: 4.59 MIL/uL (ref 3.87–5.11)
RDW: 17.9 % — AB (ref 11.5–15.5)
WBC: 17.2 10*3/uL — AB (ref 4.0–10.5)

## 2013-06-04 LAB — PREGNANCY, URINE: PREG TEST UR: NEGATIVE

## 2013-06-04 LAB — PROTIME-INR
INR: 1.03 (ref 0.00–1.49)
PROTHROMBIN TIME: 13.3 s (ref 11.6–15.2)

## 2013-06-04 SURGERY — LEFT HEART CATHETERIZATION WITH CORONARY ANGIOGRAM
Anesthesia: LOCAL

## 2013-06-04 MED ORDER — SODIUM CHLORIDE 0.9 % IJ SOLN: 3.0000 mL | INTRAMUSCULAR | Status: DC | PRN

## 2013-06-04 MED ORDER — VILAZODONE HCL 40 MG PO TABS: 40.0000 mg | ORAL_TABLET | Freq: Every day | ORAL | Status: DC

## 2013-06-04 MED ORDER — ONDANSETRON HCL 4 MG/2ML IJ SOLN: 4.0000 mg | Freq: Four times a day (QID) | INTRAMUSCULAR | Status: DC | PRN

## 2013-06-04 MED ORDER — CLONAZEPAM 1 MG PO TABS: 1.0000 mg | ORAL_TABLET | Freq: Two times a day (BID) | ORAL | Status: DC | PRN

## 2013-06-04 MED ORDER — MIDAZOLAM HCL 2 MG/2ML IJ SOLN
INTRAMUSCULAR | Status: AC
  Filled 2013-06-04: qty 2

## 2013-06-04 MED ORDER — HEPARIN SODIUM (PORCINE) 1000 UNIT/ML IJ SOLN
INTRAMUSCULAR | Status: AC
  Filled 2013-06-04: qty 1

## 2013-06-04 MED ORDER — SODIUM CHLORIDE 0.9 % IV SOLN: 1.0000 mL/kg/h | INTRAVENOUS | Status: DC

## 2013-06-04 MED ORDER — PREGABALIN 75 MG PO CAPS: 75.0000 mg | ORAL_CAPSULE | Freq: Two times a day (BID) | ORAL | Status: DC

## 2013-06-04 MED ORDER — FENTANYL CITRATE 0.05 MG/ML IJ SOLN
INTRAMUSCULAR | Status: AC
  Filled 2013-06-04: qty 2

## 2013-06-04 MED ORDER — OXYCODONE-ACETAMINOPHEN 5-325 MG PO TABS: 1.0000 | ORAL_TABLET | ORAL | Status: DC | PRN

## 2013-06-04 MED ORDER — SODIUM CHLORIDE 0.9 % IV SOLN
INTRAVENOUS | Status: DC
  Administered 2013-06-04: 10:00:00 via INTRAVENOUS

## 2013-06-04 MED ORDER — SODIUM CHLORIDE 0.9 % IJ SOLN
3.0000 mL | Freq: Two times a day (BID) | INTRAMUSCULAR | Status: DC
  Administered 2013-06-04: 3 mL via INTRAVENOUS

## 2013-06-04 MED ORDER — NITROGLYCERIN 0.2 MG/ML ON CALL CATH LAB
INTRAVENOUS | Status: AC
  Filled 2013-06-04: qty 1

## 2013-06-04 MED ORDER — LIDOCAINE HCL (PF) 1 % IJ SOLN
INTRAMUSCULAR | Status: AC
  Filled 2013-06-04: qty 30

## 2013-06-04 MED ORDER — SODIUM CHLORIDE 0.9 % IV SOLN: 250.0000 mL | INTRAVENOUS | Status: DC | PRN

## 2013-06-04 MED ORDER — ACETAMINOPHEN 325 MG PO TABS: 650.0000 mg | ORAL_TABLET | ORAL | Status: DC | PRN

## 2013-06-04 MED ORDER — HEPARIN (PORCINE) IN NACL 2-0.9 UNIT/ML-% IJ SOLN
INTRAMUSCULAR | Status: AC
  Filled 2013-06-04: qty 1000

## 2013-06-04 MED ORDER — VERAPAMIL HCL 2.5 MG/ML IV SOLN
INTRAVENOUS | Status: AC
Start: 2013-06-04 — End: 2013-06-04
  Filled 2013-06-04: qty 2

## 2013-06-04 MED ORDER — ASPIRIN 81 MG PO CHEW: 81.0000 mg | CHEWABLE_TABLET | ORAL | Status: DC
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
## 2013-06-04 NOTE — Interval H&P Note (Signed)
History and Physical Interval Note:  06/04/2013 12:36 PM  Diana Moreno  has presented today for surgery, with the diagnosis of LV Dysfunction  The various methods of treatment have been discussed with the patient and family. After consideration of risks, benefits and other options for treatment, the patient has consented to  Procedure(s): LEFT HEART CATHETERIZATION WITH CORONARY ANGIOGRAM (N/A) as a surgical intervention .  The patient's history has been reviewed, patient examined, no change in status, stable for surgery.  I have reviewed the patient's chart and labs.  Questions were answered to the patient's satisfaction.    Cath Lab Visit (complete for each Cath Lab visit)  Clinical Evaluation Leading to the Procedure:   ACS: no  Non-ACS:    Anginal Classification: No Symptoms  Anti-ischemic medical therapy: Minimal Therapy (1 class of medications)  Non-Invasive Test Results: No non-invasive testing performed  Prior CABG: No previous CABG       Tonny Bollman

## 2013-06-04 NOTE — Telephone Encounter (Signed)
Pt calls and wants refill on Klonopin and samples of Lyrica and Vibryd

## 2013-06-04 NOTE — Telephone Encounter (Signed)
Left message on vm

## 2013-06-04 NOTE — Telephone Encounter (Signed)
Diana Moreno, Rx placed in in-box ready for pickup/faxing. We do not have any samples available for Lyrica or Vibryd, Rx has been printed for both.     (We've been running out of samples for other patients since she's so reliant on these, I want her to start taking more responsibility for these medications)

## 2013-06-04 NOTE — Discharge Instructions (Signed)

## 2013-06-04 NOTE — CV Procedure (Signed)
    Cardiac Catheterization Procedure Note  Name: Diana Moreno MRN: [id number] DOB: 1976/01/25  Procedure: Catheter placement, Selective Coronary Angiography  Indication: Cardiomyopathy, evaluate for ischemic disease. Hx of cardiac embolism and subsequent abdominal aortic occlusion.   Procedural Details: The right wrist was prepped, draped, and anesthetized with 1% lidocaine. Using the modified Seldinger technique, a 5 French sheath was introduced into the right radial artery. 3 mg of verapamil was administered through the sheath, weight-based unfractionated heparin was administered intravenously. Standard Judkins catheters were used for selective coronary angiography. Catheter exchanges were performed over an exchange length guidewire. There were no immediate procedural complications. A TR band was used for radial hemostasis at the completion of the procedure.  The patient was transferred to the post catheterization recovery area for further monitoring.  Procedural Findings: Hemodynamics: AO 92/57  Coronary angiography: Coronary dominance: right  Left mainstem: Arises from the left cusp. Divides into the LAD and left circumflex. No obstructive disease.  Left anterior descending (LAD): Widely patent throughout. The proximal LAD has no stenosis. The first diagonal is large in caliber with no stenosis. The mid and distal LAD have no stenosis.  Left circumflex (LCx): The left circumflex is patent. There is mild nonobstructive disease involving the first OM branch with about 30% stenosis. There is no high-grade disease throughout the left circumflex distribution.  Right coronary artery (RCA): The mid RCA is totally occluded just after the RV marginal branch. There are left to right collaterals supplying the PDA.  Left ventriculography: Not done  Final Conclusions:   1. No significant coronary obstructive disease in the left main, LAD, or left circumflex  2. Total occlusion of the RCA  with left to right collaterals  3. Known severe LV dysfunction  Recommendations: Medical therapy. The patient will resume warfarin tonight. Will arrange followup with Dr. Tenny Craw.  Tonny Bollman 06/04/2013, 1:19 PM

## 2013-06-14 ENCOUNTER — Telehealth: Payer: Self-pay | Admitting: *Deleted

## 2013-06-14 DIAGNOSIS — Z0279 Encounter for issue of other medical certificate: Secondary | ICD-10-CM

## 2013-06-14 NOTE — Telephone Encounter (Addendum)
With all of test results and evaluation by C Dixon  I would also reocmm that patient stop coumadin and keep on aspirin  Make sure she has f/u in clinic with me. ----- Message ----- From: Pricilla Riffle, MD  Unable to reach pt or leave a message, mailbox is full.

## 2013-06-15 ENCOUNTER — Ambulatory Visit (INDEPENDENT_AMBULATORY_CARE_PROVIDER_SITE_OTHER): Payer: Medicaid Other | Admitting: Family Medicine

## 2013-06-15 ENCOUNTER — Encounter: Payer: Self-pay | Admitting: *Deleted

## 2013-06-15 VITALS — BP 123/81 | HR 72

## 2013-06-15 DIAGNOSIS — I219 Acute myocardial infarction, unspecified: Secondary | ICD-10-CM | POA: Diagnosis not present

## 2013-06-15 DIAGNOSIS — I513 Intracardiac thrombosis, not elsewhere classified: Secondary | ICD-10-CM

## 2013-06-15 DIAGNOSIS — Z7901 Long term (current) use of anticoagulants: Secondary | ICD-10-CM

## 2013-06-15 LAB — POCT INR: INR: 1.2

## 2013-06-15 NOTE — Progress Notes (Signed)
Diana Moreno or Hospital doctor, We please let patient know that on chart review it appears her cardiologist Dr. Tenny Craw was trying to contact the patient yesterday but was unable to communicate that Dr. Tenny Craw would recommend that patient stop Coumadin and continue on aspirin.  Looking at Dr. Darletta Moll note from January 14 it appears he was awaiting for her cardiologist Dr. Tenny Craw to make the decision on how long patient should stay on Coumadin.  Since Dr. Tenny Craw feels that Coumadin is no longer warranted I would encourage patient to stop Coumadin, she no longer needs INR checks.

## 2013-06-16 NOTE — Progress Notes (Signed)
Pt.notified

## 2013-06-17 ENCOUNTER — Telehealth: Payer: Self-pay | Admitting: *Deleted

## 2013-06-17 NOTE — Telephone Encounter (Signed)
F/u ° ° ° °Pt returning a call from nurse. Please call pt.  °

## 2013-06-17 NOTE — Telephone Encounter (Signed)
Pt called requesting samples of Lyria and Vybrid.(see phone note from 06/04/2013) A rx was written for her on 1/16. I did tell patient those samples were not available.Pt voiced understanding

## 2013-06-17 NOTE — Telephone Encounter (Signed)
Pt was given the information. She verbalized understanding. Pt was offered sooner app than the one I scheduled for 07/19/13 3pm. Pt declined morning apps. Please contact pt if she is needed to be seen sooner.

## 2013-06-22 ENCOUNTER — Ambulatory Visit: Payer: Self-pay | Admitting: *Deleted

## 2013-06-22 ENCOUNTER — Ambulatory Visit (INDEPENDENT_AMBULATORY_CARE_PROVIDER_SITE_OTHER): Payer: PRIVATE HEALTH INSURANCE | Admitting: Psychiatry

## 2013-06-22 DIAGNOSIS — F319 Bipolar disorder, unspecified: Secondary | ICD-10-CM
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
## 2013-07-19 ENCOUNTER — Encounter: Payer: Self-pay | Admitting: Internal Medicine

## 2013-07-19 ENCOUNTER — Ambulatory Visit (INDEPENDENT_AMBULATORY_CARE_PROVIDER_SITE_OTHER): Payer: Medicaid Other | Admitting: Internal Medicine

## 2013-07-19 VITALS — BP 98/72 | HR 85 | Ht 65.0 in | Wt 161.0 lb

## 2013-07-19 DIAGNOSIS — G589 Mononeuropathy, unspecified: Secondary | ICD-10-CM | POA: Diagnosis not present

## 2013-07-19 DIAGNOSIS — G629 Polyneuropathy, unspecified: Secondary | ICD-10-CM

## 2013-07-19 MED ORDER — WARFARIN SODIUM 6 MG PO TABS: 6.0000 mg | ORAL_TABLET | Freq: Every day | ORAL | Status: DC

## 2013-07-19 NOTE — Patient Instructions (Addendum)
Your physician has recommended you make the following change in your medication:   1. Stop Aspirin 81 mg after 1 more week of taking.  2. Start Coumadin 6 mg daily in the evening.  Your physician recommends that you schedule a follow-up appointment next week with [facility]madin [facility].  You have been referred to EP here is our office for defibrillator consult. Also, referral to Neurology for neuropathy.  Your physician wants you to follow-up in: 5-6 months with Dr. Tenny Craw. You will receive a reminder letter in the mail two months in advance. If you don't receive a letter, please call our office to schedule the follow-up appointment.
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
## 2013-07-26 ENCOUNTER — Telehealth: Payer: Self-pay | Admitting: *Deleted

## 2013-07-26 DIAGNOSIS — F411 Generalized anxiety disorder: Secondary | ICD-10-CM

## 2013-07-26 MED ORDER — CLONAZEPAM 1 MG PO TABS: 1.0000 mg | ORAL_TABLET | Freq: Two times a day (BID) | ORAL | Status: DC | PRN

## 2013-07-26 NOTE — Telephone Encounter (Signed)
Pt left message asking for a refill on her clonazepam, samples of viibryd & lyrica.  Do you approve? Please advise

## 2013-07-26 NOTE — Telephone Encounter (Signed)
Diana Moreno, Rx placed in in-box ready for pickup/faxing. We don't have any samples for viibryid or lyrica, I'd encourage her to follow up to discuss alternative medications so she doesn't have to be reliant on samples.

## 2013-07-27 ENCOUNTER — Telehealth: Payer: Self-pay | Admitting: *Deleted

## 2013-07-27 NOTE — Telephone Encounter (Addendum)
Pt has multiple concerns that she wants to address. She does have an appt at 1130 on thurs which is only for sinus issues. 1. Pt wants to get samples of the vybryd and lyrica; she has already been asked to schedule appt for this to discuss options;pt wants to get her inr checked here.She says she had to be place back on coumadin and didn't feel well and didn't feel like going there to get her INR checked. Pt made the comment in the vm she left me that she had to resort to getting her meds "in other ways." Not sure what meds she was referring to.She says she already canceled that appt. The appt is still in the system as far as I can see. I told her that she needs to keep appt with her cardiologist to manage her coumadin since she was non compliant in the past.;she is no longer seeing psych downstairs and wants you to be aware of this;pt also states she is seeing a neurologist within [facility] but she doesn't have an appt until April( her appt is 4-3) and wants Dr. Ivan Anchors to manage her neuro meds.Also she states she dropped off paper work from [employer] for one of the medications she is on for assistance.  Just an FYI she has appt on thurs for sinus issues but she will prob bring up these issues at appt time

## 2013-07-27 NOTE — Telephone Encounter (Signed)
Pt was notified of this today by Elease Hashimoto. Apparently rx for clonazepam has been faxed to pharm yesterday since not in my box today

## 2013-07-29 ENCOUNTER — Ambulatory Visit (INDEPENDENT_AMBULATORY_CARE_PROVIDER_SITE_OTHER): Payer: Medicaid Other | Admitting: Family Medicine

## 2013-07-29 ENCOUNTER — Encounter: Payer: Self-pay | Admitting: Family Medicine

## 2013-07-29 VITALS — BP 123/92 | HR 77 | Temp 98.3°F | Wt 162.0 lb

## 2013-07-29 DIAGNOSIS — J329 Chronic sinusitis, unspecified: Secondary | ICD-10-CM | POA: Diagnosis not present

## 2013-07-29 DIAGNOSIS — F319 Bipolar disorder, unspecified: Secondary | ICD-10-CM | POA: Diagnosis not present

## 2013-07-29 DIAGNOSIS — B9689 Other specified bacterial agents as the cause of diseases classified elsewhere: Secondary | ICD-10-CM | POA: Diagnosis not present

## 2013-07-29 DIAGNOSIS — A499 Bacterial infection, unspecified: Secondary | ICD-10-CM

## 2013-07-29 DIAGNOSIS — G609 Hereditary and idiopathic neuropathy, unspecified: Secondary | ICD-10-CM

## 2013-07-29 DIAGNOSIS — G629 Polyneuropathy, unspecified: Secondary | ICD-10-CM

## 2013-07-29 MED ORDER — AMOXICILLIN 500 MG PO CAPS: 500.0000 mg | ORAL_CAPSULE | Freq: Three times a day (TID) | ORAL | Status: DC

## 2013-07-29 MED ORDER — SULFAMETHOXAZOLE-TRIMETHOPRIM 800-160 MG PO TABS: ORAL_TABLET | ORAL | Status: AC

## 2013-07-29 MED ORDER — AMITRIPTYLINE HCL 50 MG PO TABS: 25.0000 mg | ORAL_TABLET | Freq: Every day | ORAL | Status: DC
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
## 2013-08-16 ENCOUNTER — Telehealth: Payer: Self-pay | Admitting: Physician Assistant

## 2013-08-16 NOTE — Telephone Encounter (Signed)
Pt called answering service this evening with question. She has had a sinus infection this past week. She reports mild headache today. This evening she felt a tickle in her ear and when she put her finger in her ear to relieve this, she removed it to find fresh blood. She is on warfarin. She was wondering what to do next. I think she should be seen to r/o tympanic perforation in the setting of sinus pressure/buildup just to make sure there is no further otic intervention necessary. I advised she seek medical attention. She said since she doesn't have insurance, she doesn't typically go to urgent care, so will go to the ER. Dayna Dunn PA-C

## 2013-08-19 ENCOUNTER — Ambulatory Visit (INDEPENDENT_AMBULATORY_CARE_PROVIDER_SITE_OTHER): Payer: PRIVATE HEALTH INSURANCE | Admitting: Internal Medicine

## 2013-08-19 ENCOUNTER — Encounter: Payer: Self-pay | Admitting: Internal Medicine

## 2013-08-19 VITALS — BP 122/84 | HR 96 | Ht 65.0 in | Wt 165.0 lb

## 2013-08-19 DIAGNOSIS — I255 Ischemic cardiomyopathy: Secondary | ICD-10-CM

## 2013-08-19 DIAGNOSIS — I2589 Other forms of chronic ischemic heart disease: Secondary | ICD-10-CM

## 2013-08-19 DIAGNOSIS — Z01812 Encounter for preprocedural laboratory examination: Secondary | ICD-10-CM
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
## 2013-08-19 NOTE — Patient Instructions (Signed)
Your physician recommends that you continue on your current medications as directed. Please refer to the Current Medication list given to you today.  Your physician has recommended that you have a defibrillator inserted. An implantable cardioverter defibrillator (ICD) is a small device that is placed in your chest or, in rare cases, your abdomen. This device uses electrical pulses or shocks to help control life-threatening, irregular heartbeats that could lead the heart to suddenly stop beating (sudden cardiac arrest). Leads are attached to the ICD that goes into your heart. This is done in the hospital and usually requires an overnight stay. Please see the instruction sheet given to you today for more information.  Shilpa Bushee, RN will contact you next week to schedule this procedure.  [phone]

## 2013-08-20 ENCOUNTER — Ambulatory Visit: Payer: Self-pay | Admitting: Neurology

## 2013-08-24 ENCOUNTER — Telehealth: Payer: Self-pay | Admitting: *Deleted

## 2013-08-24 NOTE — Telephone Encounter (Signed)
lmtcb to go over instructions for ICD implantation. Also need to schedule pre procedure lab work and wound check appt.

## 2013-08-30 ENCOUNTER — Telehealth: Payer: Self-pay | Admitting: Internal Medicine

## 2013-08-30 NOTE — Telephone Encounter (Signed)
New message    Pt is admitted. Had an high INR when admitted.  Had an LV thrombus a year ago.  Having a hard time getting INR in range according to patient-can pt change to xarelto because her INR is all over the place now.  Pharmacist really want to talk to Dr Tenny Craw.

## 2013-08-30 NOTE — Telephone Encounter (Signed)
Will forward for dr ross review 

## 2013-08-30 NOTE — Telephone Encounter (Signed)
New message    Talk to [facility] regarding the date set for her defibulator procedure

## 2013-08-31 NOTE — Telephone Encounter (Signed)
Follow up    Patient calling,  was advise the nurse Roanna Raider will be calling her this week to r.s procedure.

## 2013-09-01 ENCOUNTER — Other Ambulatory Visit: Payer: Self-pay | Admitting: *Deleted

## 2013-09-01 NOTE — Telephone Encounter (Signed)
Reviewed procedure instructions with patient. Pt is unsure of when she can come in for pre procedure lab work - she will call back by end of next week with a date. Wound check for 10/13/13 at 3:30 pm.  Will wait to type letter of instructions after receiving lab date from pt.

## 2013-09-01 NOTE — Telephone Encounter (Signed)
You are correct in that it is not approved for LV thrombus, or embolism in abdominal aorta as this patient had.  The [place] trials looked at DVT and PE only.  That being said, I know physicians have used Xarelto 20 mg daily for patients with h/o LV thrombus in the hospital in rare occasions before due to erratic INRs, however the indication and data behind doing this is not there yet.

## 2013-09-01 NOTE — Telephone Encounter (Signed)
A user error has taken place: encounter opened in error, closed for administrative reasons.  See 4/7 telephone note

## 2013-09-01 NOTE — Telephone Encounter (Signed)
Patient with history of LV thrombus and embolic event 1 year ago  On coumadin since  Labile INRs Pharmacy has wanted to switch.  Want to clarify.  I did not think NOAC are approved for this situation.

## 2013-09-02 ENCOUNTER — Telehealth: Payer: Self-pay | Admitting: *Deleted

## 2013-09-02 DIAGNOSIS — F411 Generalized anxiety disorder: Secondary | ICD-10-CM

## 2013-09-02 DIAGNOSIS — Z7901 Long term (current) use of anticoagulants: Secondary | ICD-10-CM | POA: Insufficient documentation

## 2013-09-02 MED ORDER — CLONAZEPAM 1 MG PO TABS: 1.0000 mg | ORAL_TABLET | Freq: Two times a day (BID) | ORAL | Status: DC | PRN

## 2013-09-02 NOTE — Telephone Encounter (Signed)
Pt is requesting a refill of klonopin. She states she does have an appt with psych this month. Is this something you are will to fill for her until she sees them this month?

## 2013-09-02 NOTE — Telephone Encounter (Signed)
Follow up    Patient has follow up questions / concerns from her recent hospital stay.     Should she take her last Lovenox shot .

## 2013-09-02 NOTE — Telephone Encounter (Signed)
Diana Moreno, Rx placed in in-box ready for pickup/faxing.  

## 2013-09-02 NOTE — Telephone Encounter (Signed)
Spoke with pt, she reports her INR today was 1.81. The md in [place] will not follow her coumadin. She wonders if she needs to take the last lovenox shot she has. Will discuss with dr Tenny Craw

## 2013-09-02 NOTE — Telephone Encounter (Signed)
Faxed rx and pt notified. 

## 2013-09-02 NOTE — Telephone Encounter (Signed)
Left message for pt to call.

## 2013-09-02 NOTE — Telephone Encounter (Signed)
Discussed with dr Tenny Craw Pt instructed not to take the lovenox shot. Patient voiced understanding

## 2013-09-09 ENCOUNTER — Encounter: Payer: Self-pay | Admitting: *Deleted

## 2013-09-09 ENCOUNTER — Encounter (HOSPITAL_COMMUNITY): Payer: Self-pay | Admitting: Pharmacy Technician

## 2013-09-09 NOTE — Telephone Encounter (Addendum)
Called to schedule pre procedure labs:  5/8.    Letter of instructions mailed to home address. Made pt aware of new time to be at hospital at 7:30 am. Patient verbalized understanding.

## 2013-09-14 ENCOUNTER — Telehealth: Payer: Self-pay | Admitting: Internal Medicine

## 2013-09-14 NOTE — Telephone Encounter (Signed)
New message           Pt would like to come in on the 7th to do blood work instead of the 8th.

## 2013-09-14 NOTE — Telephone Encounter (Signed)
Lab appt made for 09/23/13, per pt request.

## 2013-09-16 ENCOUNTER — Encounter: Payer: Self-pay | Admitting: Neurology

## 2013-09-16 ENCOUNTER — Ambulatory Visit (INDEPENDENT_AMBULATORY_CARE_PROVIDER_SITE_OTHER): Payer: Medicaid Other | Admitting: Neurology

## 2013-09-16 VITALS — BP 114/84 | HR 102 | Wt 156.2 lb

## 2013-09-16 DIAGNOSIS — I739 Peripheral vascular disease, unspecified: Principal | ICD-10-CM

## 2013-09-16 DIAGNOSIS — G63 Polyneuropathy in diseases classified elsewhere: Secondary | ICD-10-CM | POA: Diagnosis not present

## 2013-09-16 MED ORDER — GABAPENTIN 300 MG PO CAPS: 300.0000 mg | ORAL_CAPSULE | Freq: Three times a day (TID) | ORAL | Status: DC

## 2013-09-16 NOTE — Patient Instructions (Addendum)
1.  Reduce amitriptyline by one tablet per week 2.  Start neurontin 300mg  at bedtime x 3 days, 300mg  twice daily x 3 days, then 300mg  three times daily 3.  Call my office in 2 weeks with an update (week May 11th) 4.  Return to clinic in 64-months
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
## 2013-09-17 ENCOUNTER — Telehealth: Payer: Self-pay | Admitting: Neurology

## 2013-09-17 NOTE — Telephone Encounter (Signed)
Needs to talk to some one about the rx that was called in yesterday please call [phone]

## 2013-09-17 NOTE — Telephone Encounter (Signed)
Patient had given [place] the wrong pharmacy but they had rx transferred for her.

## 2013-09-20 NOTE — Telephone Encounter (Signed)
[facility] Patient has been on coumadin  I have not switched with upcoming procedure Had LV thrombus in past with arterial embolization. After procedure consider NOAC given that coumadin control has been poor.

## 2013-09-23 ENCOUNTER — Other Ambulatory Visit (INDEPENDENT_AMBULATORY_CARE_PROVIDER_SITE_OTHER): Payer: Self-pay

## 2013-09-23 DIAGNOSIS — I255 Ischemic cardiomyopathy: Secondary | ICD-10-CM

## 2013-09-23 DIAGNOSIS — I2589 Other forms of chronic ischemic heart disease: Secondary | ICD-10-CM

## 2013-09-23 DIAGNOSIS — Z01812 Encounter for preprocedural laboratory examination: Secondary | ICD-10-CM

## 2013-09-23 LAB — CBC WITH DIFFERENTIAL/PLATELET
BASOS ABS: 0.1 10*3/uL (ref 0.0–0.1)
Basophils Relative: 0.5 % (ref 0.0–3.0)
Eosinophils Absolute: 0.1 10*3/uL (ref 0.0–0.7)
Eosinophils Relative: 0.8 % (ref 0.0–5.0)
HCT: 42 % (ref 36.0–46.0)
Hemoglobin: 13.6 g/dL (ref 12.0–15.0)
LYMPHS PCT: 26.5 % (ref 12.0–46.0)
Lymphs Abs: 4.2 10*3/uL — ABNORMAL HIGH (ref 0.7–4.0)
MCHC: 32.3 g/dL (ref 30.0–36.0)
MCV: 88.6 fl (ref 78.0–100.0)
MONOS PCT: 6.7 % (ref 3.0–12.0)
Monocytes Absolute: 1 10*3/uL (ref 0.1–1.0)
NEUTROS ABS: 10.3 10*3/uL — AB (ref 1.4–7.7)
Neutrophils Relative %: 65.5 % (ref 43.0–77.0)
PLATELETS: 295 10*3/uL (ref 150.0–400.0)
RBC: 4.74 Mil/uL (ref 3.87–5.11)
RDW: 17.4 % — AB (ref 11.5–15.5)
WBC: 15.8 10*3/uL — ABNORMAL HIGH (ref 4.0–10.5)

## 2013-09-23 LAB — BASIC METABOLIC PANEL
BUN: 6 mg/dL (ref 6–23)
CALCIUM: 9.9 mg/dL (ref 8.4–10.5)
CHLORIDE: 106 meq/L (ref 96–112)
CO2: 24 meq/L (ref 19–32)
CREATININE: 1 mg/dL (ref 0.4–1.2)
GFR: 64.01 mL/min (ref >60.00)
Glucose, Bld: 92 mg/dL (ref 70–99)
Potassium: 4.2 mEq/L (ref 3.5–5.1)
Sodium: 140 mEq/L (ref 135–145)

## 2013-09-23 LAB — PROTIME-INR
INR: 1.5 ratio — AB (ref 0.8–1.0)
PROTHROMBIN TIME: 16.7 s — AB (ref 9.6–13.1)

## 2013-09-29 MED ORDER — SODIUM CHLORIDE 0.9 % IR SOLN
80.0000 mg | Status: DC
  Filled 2013-09-29: qty 2

## 2013-09-29 MED ORDER — SODIUM CHLORIDE 0.9 % IV SOLN
INTRAVENOUS | Status: DC
  Administered 2013-09-30: 09:00:00 via INTRAVENOUS

## 2013-09-29 MED ORDER — CEFAZOLIN SODIUM-DEXTROSE 2-3 GM-% IV SOLR
2.0000 g | INTRAVENOUS | Status: AC
  Administered 2013-09-30: 2 g via INTRAVENOUS
  Filled 2013-09-29: qty 50

## 2013-09-29 MED ORDER — CHLORHEXIDINE GLUCONATE 4 % EX LIQD
60.0000 mL | Freq: Once | CUTANEOUS | Status: DC
  Filled 2013-09-29: qty 60

## 2013-09-30 ENCOUNTER — Encounter (HOSPITAL_COMMUNITY): Payer: Self-pay | Admitting: Certified Registered Nurse Anesthetist

## 2013-09-30 ENCOUNTER — Ambulatory Visit (HOSPITAL_COMMUNITY): Payer: Self-pay | Admitting: Certified Registered Nurse Anesthetist

## 2013-09-30 ENCOUNTER — Encounter (HOSPITAL_COMMUNITY): Payer: MEDICAID | Admitting: Certified Registered Nurse Anesthetist

## 2013-09-30 ENCOUNTER — Encounter (HOSPITAL_COMMUNITY)
Admission: RE | Disposition: A | Discharge status: Home / Self Care | Payer: Self-pay | Source: Ambulatory Visit | Attending: Internal Medicine

## 2013-09-30 ENCOUNTER — Ambulatory Visit (HOSPITAL_COMMUNITY)
Admission: RE | Admit: 2013-09-30 | Discharge: 2013-10-01 | Disposition: A | Discharge status: Home / Self Care | Payer: Medicaid Other | Source: Ambulatory Visit | Attending: Internal Medicine | Admitting: Internal Medicine

## 2013-09-30 DIAGNOSIS — IMO0002 Reserved for concepts with insufficient information to code with codable children: Secondary | ICD-10-CM | POA: Insufficient documentation

## 2013-09-30 DIAGNOSIS — I509 Heart failure, unspecified: Secondary | ICD-10-CM | POA: Insufficient documentation

## 2013-09-30 DIAGNOSIS — I429 Cardiomyopathy, unspecified: Secondary | ICD-10-CM

## 2013-09-30 DIAGNOSIS — I059 Rheumatic mitral valve disease, unspecified: Secondary | ICD-10-CM | POA: Insufficient documentation

## 2013-09-30 DIAGNOSIS — F319 Bipolar disorder, unspecified: Secondary | ICD-10-CM | POA: Insufficient documentation

## 2013-09-30 DIAGNOSIS — I428 Other cardiomyopathies: Secondary | ICD-10-CM | POA: Diagnosis not present

## 2013-09-30 DIAGNOSIS — I5022 Chronic systolic (congestive) heart failure: Secondary | ICD-10-CM | POA: Insufficient documentation

## 2013-09-30 DIAGNOSIS — F411 Generalized anxiety disorder: Secondary | ICD-10-CM | POA: Insufficient documentation

## 2013-09-30 DIAGNOSIS — F4310 Post-traumatic stress disorder, unspecified: Secondary | ICD-10-CM | POA: Insufficient documentation

## 2013-09-30 DIAGNOSIS — Z79899 Other long term (current) drug therapy: Secondary | ICD-10-CM | POA: Insufficient documentation

## 2013-09-30 DIAGNOSIS — K449 Diaphragmatic hernia without obstruction or gangrene: Secondary | ICD-10-CM | POA: Insufficient documentation

## 2013-09-30 DIAGNOSIS — I2510 Atherosclerotic heart disease of native coronary artery without angina pectoris: Secondary | ICD-10-CM | POA: Insufficient documentation

## 2013-09-30 DIAGNOSIS — F17200 Nicotine dependence, unspecified, uncomplicated: Secondary | ICD-10-CM | POA: Insufficient documentation

## 2013-09-30 DIAGNOSIS — Z86711 Personal history of pulmonary embolism: Secondary | ICD-10-CM | POA: Insufficient documentation

## 2013-09-30 DIAGNOSIS — G8929 Other chronic pain: Secondary | ICD-10-CM | POA: Insufficient documentation

## 2013-09-30 DIAGNOSIS — Z23 Encounter for immunization: Secondary | ICD-10-CM | POA: Insufficient documentation

## 2013-09-30 DIAGNOSIS — E282 Polycystic ovarian syndrome: Secondary | ICD-10-CM | POA: Insufficient documentation

## 2013-09-30 HISTORY — DX: Other cardiomyopathies: I42.8

## 2013-09-30 HISTORY — DX: Reserved for concepts with insufficient information to code with codable children: IMO0002

## 2013-09-30 HISTORY — DX: Other chronic pain: G89.29

## 2013-09-30 HISTORY — DX: Embolism and thrombosis of unspecified parts of aorta: I74.10

## 2013-09-30 HISTORY — DX: Personal history of other diseases of the digestive system: Z87.19

## 2013-09-30 HISTORY — DX: Post-traumatic stress disorder, unspecified: F43.10

## 2013-09-30 HISTORY — DX: Presence of automatic (implantable) cardiac defibrillator: Z95.810

## 2013-09-30 HISTORY — DX: Disorder of adrenal gland, unspecified: E27.9

## 2013-09-30 HISTORY — DX: Atherosclerosis of aorta: I70.0

## 2013-09-30 HISTORY — PX: IMPLANTABLE CARDIOVERTER DEFIBRILLATOR IMPLANT: SHX5473

## 2013-09-30 HISTORY — DX: Other specified disorders of adrenal gland: E27.8

## 2013-09-30 LAB — PROTIME-INR
INR: 1.38 (ref 0.00–1.49)
PROTHROMBIN TIME: 16.6 s — AB (ref 11.6–15.2)

## 2013-09-30 LAB — CBC
HEMATOCRIT: 35.8 % — AB (ref 36.0–46.0)
HEMOGLOBIN: 11.6 g/dL — AB (ref 12.0–15.0)
MCH: 28.3 pg (ref 26.0–34.0)
MCHC: 32.4 g/dL (ref 30.0–36.0)
MCV: 87.3 fL (ref 78.0–100.0)
Platelets: 250 10*3/uL (ref 150–400)
RBC: 4.1 MIL/uL (ref 3.87–5.11)
RDW: 15.8 % — AB (ref 11.5–15.5)
WBC: 11.8 10*3/uL — ABNORMAL HIGH (ref 4.0–10.5)

## 2013-09-30 LAB — PREGNANCY, URINE: Preg Test, Ur: NEGATIVE

## 2013-09-30 LAB — SURGICAL PCR SCREEN
MRSA, PCR: NEGATIVE
Staphylococcus aureus: NEGATIVE

## 2013-09-30 SURGERY — IMPLANTABLE CARDIOVERTER DEFIBRILLATOR IMPLANT
Anesthesia: Monitor Anesthesia Care

## 2013-09-30 MED ORDER — TRAMADOL HCL 50 MG PO TABS
50.0000 mg | ORAL_TABLET | Freq: Four times a day (QID) | ORAL | Status: DC | PRN
  Administered 2013-09-30: 50 mg via ORAL

## 2013-09-30 MED ORDER — ACETAMINOPHEN 325 MG PO TABS: 325.0000 mg | ORAL_TABLET | ORAL | Status: DC | PRN

## 2013-09-30 MED ORDER — PROPOFOL INFUSION 10 MG/ML OPTIME
INTRAVENOUS | Status: DC | PRN
  Administered 2013-09-30: 100 ug/kg/min via INTRAVENOUS

## 2013-09-30 MED ORDER — PNEUMOCOCCAL VAC POLYVALENT 25 MCG/0.5ML IJ INJ
0.5000 mL | INJECTION | INTRAMUSCULAR | Status: AC
  Administered 2013-10-01: 0.5 mL via INTRAMUSCULAR
  Filled 2013-09-30: qty 0.5

## 2013-09-30 MED ORDER — SODIUM CHLORIDE 0.9 % IV SOLN
INTRAVENOUS | Status: DC | PRN
  Administered 2013-09-30: 11:00:00 via INTRAVENOUS

## 2013-09-30 MED ORDER — HEPARIN (PORCINE) IN NACL 2-0.9 UNIT/ML-% IJ SOLN
INTRAMUSCULAR | Status: AC
  Filled 2013-09-30: qty 500

## 2013-09-30 MED ORDER — MIDAZOLAM HCL 5 MG/5ML IJ SOLN
INTRAMUSCULAR | Status: DC | PRN
  Administered 2013-09-30: 1 mg via INTRAVENOUS
  Administered 2013-09-30: 2 mg via INTRAVENOUS
  Administered 2013-09-30: 1 mg via INTRAVENOUS

## 2013-09-30 MED ORDER — MUPIROCIN 2 % EX OINT
TOPICAL_OINTMENT | Freq: Two times a day (BID) | CUTANEOUS | Status: DC
  Administered 2013-09-30: 1 via NASAL
  Administered 2013-09-30: 22:00:00 via NASAL
  Filled 2013-09-30 (×2): qty 22

## 2013-09-30 MED ORDER — FENTANYL CITRATE 0.05 MG/ML IJ SOLN
INTRAMUSCULAR | Status: DC | PRN
  Administered 2013-09-30 (×5): 50 ug via INTRAVENOUS

## 2013-09-30 MED ORDER — LIDOCAINE HCL (PF) 1 % IJ SOLN
INTRAMUSCULAR | Status: AC
  Filled 2013-09-30: qty 30

## 2013-09-30 MED ORDER — ATORVASTATIN CALCIUM 40 MG PO TABS
40.0000 mg | ORAL_TABLET | Freq: Every day | ORAL | Status: DC
  Filled 2013-09-30 (×2): qty 1

## 2013-09-30 MED ORDER — WARFARIN SODIUM 6 MG PO TABS
6.0000 mg | ORAL_TABLET | Freq: Every day | ORAL | Status: DC
  Administered 2013-09-30: 6 mg via ORAL
  Filled 2013-09-30 (×2): qty 1

## 2013-09-30 MED ORDER — CLONAZEPAM 1 MG PO TABS
1.0000 mg | ORAL_TABLET | Freq: Two times a day (BID) | ORAL | Status: DC | PRN
  Administered 2013-09-30 – 2013-10-01 (×2): 1 mg via ORAL
  Filled 2013-09-30 (×2): qty 1

## 2013-09-30 MED ORDER — SODIUM CHLORIDE 0.9 % IV SOLN: INTRAVENOUS | Status: AC

## 2013-09-30 MED ORDER — VILAZODONE HCL 40 MG PO TABS
40.0000 mg | ORAL_TABLET | Freq: Every day | ORAL | Status: DC
  Administered 2013-10-01: 40 mg via ORAL
  Filled 2013-09-30 (×2): qty 1

## 2013-09-30 MED ORDER — GABAPENTIN 300 MG PO CAPS
300.0000 mg | ORAL_CAPSULE | Freq: Three times a day (TID) | ORAL | Status: DC
  Administered 2013-09-30 – 2013-10-01 (×3): 300 mg via ORAL
  Filled 2013-09-30 (×5): qty 1

## 2013-09-30 MED ORDER — ONDANSETRON HCL 4 MG/2ML IJ SOLN
4.0000 mg | Freq: Four times a day (QID) | INTRAMUSCULAR | Status: DC | PRN
  Administered 2013-09-30: 4 mg via INTRAVENOUS

## 2013-09-30 MED ORDER — WARFARIN - PHYSICIAN DOSING INPATIENT
Freq: Every day | Status: DC
  Administered 2013-09-30: 18:00:00

## 2013-09-30 MED ORDER — CEFAZOLIN SODIUM 1-5 GM-% IV SOLN
1.0000 g | Freq: Four times a day (QID) | INTRAVENOUS | Status: AC
  Administered 2013-09-30 – 2013-10-01 (×3): 1 g via INTRAVENOUS
  Filled 2013-09-30 (×3): qty 50

## 2013-09-30 MED ORDER — HYDROMORPHONE HCL PF 1 MG/ML IJ SOLN
1.0000 mg | INTRAMUSCULAR | Status: DC | PRN
  Administered 2013-09-30: 1 mg via INTRAVENOUS

## 2013-09-30 MED ORDER — HYDROMORPHONE HCL PF 1 MG/ML IJ SOLN
INTRAMUSCULAR | Status: AC
  Filled 2013-09-30: qty 1

## 2013-09-30 MED ORDER — CARVEDILOL 3.125 MG PO TABS
3.1250 mg | ORAL_TABLET | Freq: Two times a day (BID) | ORAL | Status: DC
  Administered 2013-09-30 – 2013-10-01 (×2): 3.125 mg via ORAL
  Filled 2013-09-30 (×4): qty 1

## 2013-09-30 MED ORDER — AMITRIPTYLINE HCL 50 MG PO TABS
50.0000 mg | ORAL_TABLET | Freq: Every evening | ORAL | Status: DC | PRN
  Filled 2013-09-30: qty 2

## 2013-09-30 MED ORDER — FENTANYL CITRATE 0.05 MG/ML IJ SOLN
25.0000 ug | Freq: Once | INTRAMUSCULAR | Status: AC | PRN
  Administered 2013-09-30: 25 ug via INTRAVENOUS
  Filled 2013-09-30: qty 2
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
## 2013-09-30 NOTE — Progress Notes (Signed)
UR Completed Graiden Henes Graves-Bigelow, RN,BSN [phone]  

## 2013-09-30 NOTE — Transfer of Care (Signed)
Immediate Anesthesia Transfer of Care Note  Patient: Diana Moreno  Procedure(s) Performed: Procedure(s): IMPLANTABLE CARDIOVERTER DEFIBRILLATOR IMPLANT (N/A)  Patient Location: Cath Lab  Anesthesia Type:MAC  Level of Consciousness: awake, alert  and oriented  Airway & Oxygen Therapy: Patient Spontanous Breathing and Patient connected to nasal cannula oxygen  Post-op Assessment: Report given to PACU RN and Post -op Vital signs reviewed and stable  Post vital signs: Reviewed and stable  Complications: No apparent anesthesia complications

## 2013-09-30 NOTE — Anesthesia Postprocedure Evaluation (Signed)
Anesthesia Post Note  Patient: Diana Moreno  Procedure(s) Performed: Procedure(s) (LRB): IMPLANTABLE CARDIOVERTER DEFIBRILLATOR IMPLANT (N/A)  Anesthesia type: General  Patient location: PACU  Post pain: Pain level controlled  Post assessment: Patient's Cardiovascular Status Stable  Last Vitals:  Filed Vitals:   09/30/13 1605  BP: 114/60  Pulse:   Temp:   Resp:     Post vital signs: Reviewed and stable  Level of consciousness: alert  Complications: No apparent anesthesia complications

## 2013-09-30 NOTE — CV Procedure (Signed)
Diana Moreno [id number]  [id number]  Preop HE:[id number] Postop Dx same/   Procedure: single chamber ICD implantation with out intraoperative defibrillation threshold testing  Following the obtaining of informed consent the patient was brought to the electrophysiology laboratory in place of the fluoroscopic table in the supine position. After routine prep and drape, lidocaine was infiltrated in the prepectoral subclavicular region and an incision was made and carried down to the layer of the prepectoral fascia using electrocautery and sharp dissection. A pocket was formed similarly.  Thereafter an attention was turned to gain access to the extrathoracic left subclavian vein which was accomplished with  difficulty and without the aspiration of air or puncture of the artery. A 9 French sheath was placed for which was then passed a  Single coil  active fixation defibrillator lead, model [employer]  [phone] serial number [id number]. It was  passed under fluoroscopic guidance to the right ventricular apex. multiple In its location the bipolar R wave was 7 millivolts, impedance was 600 ohms, the pacing threshold was 1.5 volts at 0.60msec. Current at threshold was  2.5  mA.  There was no diaphragmatic pacing at 10 V. The current of injury was brisk .  The lead was secured to the prepectoral fascia and then attached to a [employer]  ICD, serial number  [id number].  Through the device, the bipolar R wave was 9.8 millivolts, impedance was 540 ohms, the pacing threshold was 1.0 volts at 0.5 msec. High-voltage impedance of 67 ohms.  The pocket was copiously irrigated with antibiotic containing saline solution. Hemostasis was assured, and the device and the lead was placed in the pocket and secured to the prepectoral fascia.  The wound is closed in 3 layers in normal fashion. The wound is washed dried and a benzoin Steri-Strips dressing was then applied. Needle counts sponge counts and instrument counts  were correct at the end of the procedure according to the staff.   Patient tolerated the procedure without apparent complication      Cx: None      Duke Salvia, MD 09/30/2013 11:58 AM

## 2013-09-30 NOTE — Anesthesia Preprocedure Evaluation (Signed)
Anesthesia Evaluation  Patient identified by MRN, date of birth, ID band Patient awake    Reviewed: Allergy & Precautions, H&P , NPO status , Patient's Chart, lab work & pertinent test results  History of Anesthesia Complications Negative for: history of anesthetic complications  Airway Mallampati: I TM Distance: >3 FB Neck ROM: Full    Dental no notable dental hx. (+) Teeth Intact, Dental Advisory Given, Chipped,    Pulmonary neg pulmonary ROS, Current Smoker,  breath sounds clear to auscultation  Pulmonary exam normal       Cardiovascular + Peripheral Vascular Disease negative cardio ROS  IRhythm:Regular Rate:Normal  Aortic occlusion,   Neuro/Psych  Neuromuscular disease negative neurological ROS  negative psych ROS   GI/Hepatic negative GI ROS, Neg liver ROS,   Endo/Other  negative endocrine ROS  Renal/GU      Musculoskeletal negative musculoskeletal ROS (+)   Abdominal (+)  Abdomen: soft. Bowel sounds: normal.  Peds negative pediatric ROS (+)  Hematology negative hematology ROS (+)   Anesthesia Other Findings   Reproductive/Obstetrics negative OB ROS                           Anesthesia Physical Anesthesia Plan  ASA: III  Anesthesia Plan: General and MAC   Post-op Pain Management:    Induction: Intravenous  Airway Management Planned:   Additional Equipment:   Intra-op Plan:   Post-operative Plan:   Informed Consent: I have reviewed the patients History and Physical, chart, labs and discussed the procedure including the risks, benefits and alternatives for the proposed anesthesia with the patient or authorized representative who has indicated his/her understanding and acceptance.   Dental advisory given  Plan Discussed with: CRNA and Surgeon  Anesthesia Plan Comments:         Anesthesia Quick Evaluation

## 2013-09-30 NOTE — Discharge Instructions (Signed)
° °  Supplemental Discharge Instructions for  Pacemaker/Defibrillator Patients  Activity No heavy lifting or vigorous activity with your left/right arm for 6 to 8 weeks.  Do not raise your left/right arm above your head for one week.  Gradually raise your affected arm as drawn below.           05/18                      05/19                       05/20                      05/21       NO DRIVING for 1 week; you may begin driving on 65/07/5463. WOUND CARE   Keep the wound area clean and dry.  You may shower but no soaking in tub bath, swimming pool or hot tub for 10-14 days until wound is completely healed.    The Dermabond (glue) on your wound will fall off on its own; do not pull it off.  No bandage is needed on the site.  DO NOT apply any creams, oils, or ointments to the wound area.   If you notice any drainage or discharge from the wound, any swelling or bruising at the site, or you develop a fever > 101? F after you are discharged home, call the office at once.  Special Instructions   You are still able to use cellular telephones; use the ear opposite the side where you have your pacemaker/defibrillator.  Avoid carrying your cellular phone near your device.   When traveling through airports, show security personnel your identification card to avoid being screened in the metal detectors.  Ask the security personnel to use the hand wand.   Avoid arc welding equipment, MRI testing (magnetic resonance imaging), TENS units (transcutaneous nerve stimulators).  Call the office for questions about other devices.   Avoid electrical appliances that are in poor condition or are not properly grounded.   Microwave ovens are safe to be near or to operate.  Additional information for defibrillator patients should your device go off:   If your device goes off ONCE and you feel fine afterward, notify the device clinic nurses.   If your device goes off ONCE and you do not feel well afterward, call 911.   If your device goes off TWICE, call 911.   If your device goes off THREE times in one day, call 911.  DO NOT DRIVE YOURSELF OR A FAMILY MEMBER WITH A DEFIBRILLATOR TO THE HOSPITAL--CALL 911.

## 2013-10-01 ENCOUNTER — Ambulatory Visit (HOSPITAL_COMMUNITY): Payer: Self-pay

## 2013-10-01 ENCOUNTER — Encounter (HOSPITAL_COMMUNITY): Payer: Self-pay | Admitting: Physician Assistant

## 2013-10-01 ENCOUNTER — Telehealth: Payer: Self-pay | Admitting: *Deleted

## 2013-10-01 DIAGNOSIS — I428 Other cardiomyopathies: Secondary | ICD-10-CM

## 2013-10-01 MED ORDER — FENTANYL 12 MCG/HR TD PT72: 12.5000 ug | MEDICATED_PATCH | Freq: Once | TRANSDERMAL | Status: DC | PRN

## 2013-10-01 MED ORDER — FENTANYL CITRATE 0.05 MG/ML IJ SOLN
50.0000 ug | Freq: Once | INTRAMUSCULAR | Status: AC
  Administered 2013-10-01: 50 ug via INTRAVENOUS

## 2013-10-01 MED ORDER — FENTANYL 12 MCG/HR TD PT72
12.5000 ug | MEDICATED_PATCH | TRANSDERMAL | Status: DC
  Filled 2013-10-01: qty 1

## 2013-10-01 MED ORDER — TRAMADOL HCL 50 MG PO TABS: 50.0000 mg | ORAL_TABLET | Freq: Four times a day (QID) | ORAL | Status: AC | PRN

## 2013-10-01 MED ORDER — FENTANYL 12 MCG/HR TD PT72: 12.5000 ug | MEDICATED_PATCH | TRANSDERMAL | Status: DC

## 2013-10-01 MED ORDER — FENTANYL CITRATE 0.05 MG/ML IJ SOLN
INTRAMUSCULAR | Status: AC
  Filled 2013-10-01: qty 2

## 2013-10-01 NOTE — Telephone Encounter (Signed)
Pt called and wanted a refill of tramadol since she recently had a pacemaker put in.Ok per Dr. Ivan Anchors. Tried to call pt but no answer.rx faxed to pharm on file

## 2013-10-01 NOTE — Progress Notes (Signed)
Pt requested visit from chaplain pertaining to notarizing an advance directive. Chaplin went over advanced directive with pt. Some info was missing and she had to locate the missing info before it could be notarized. She was advised once she obtained the missing info to contact chaplain for notarization. Pt also requested prayer and chaplain provided prayer and a listening ear.  10/01/13 1000  Clinical Encounter Type  Visited With Patient  Visit Type Initial  Referral From Patient  Consult/Referral To Chaplain  Spiritual Encounters  Spiritual Needs Prayer  Stress Factors  Patient Stress Factors Financial concerns  Family Stress Factors None identified
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
## 2013-10-04 ENCOUNTER — Telehealth: Payer: Self-pay | Admitting: Family Medicine

## 2013-10-04 NOTE — Telephone Encounter (Signed)
Error

## 2013-10-13 ENCOUNTER — Ambulatory Visit: Payer: Self-pay

## 2013-10-15 ENCOUNTER — Ambulatory Visit (INDEPENDENT_AMBULATORY_CARE_PROVIDER_SITE_OTHER): Payer: Medicaid Other | Admitting: *Deleted

## 2013-10-15 DIAGNOSIS — I428 Other cardiomyopathies: Secondary | ICD-10-CM | POA: Diagnosis not present

## 2013-10-15 DIAGNOSIS — I429 Cardiomyopathy, unspecified: Secondary | ICD-10-CM

## 2013-10-15 LAB — MDC_IDC_ENUM_SESS_TYPE_INCLINIC
Brady Statistic RV Percent Paced: 0 %
Date Time Interrogation Session: 20150529161216
HighPow Impedance: 76 Ohm
Implantable Pulse Generator Serial Number: 115386
Lead Channel Impedance Value: 635 Ohm
Lead Channel Pacing Threshold Amplitude: 1.1 V
Lead Channel Setting Pacing Pulse Width: 0.5 ms
MDC IDC MSMT BATTERY REMAINING LONGEVITY: 138 mo
MDC IDC MSMT BATTERY REMAINING PERCENTAGE: 100 %
MDC IDC MSMT LEADCHNL RV PACING THRESHOLD PULSEWIDTH: 0.5 ms
MDC IDC SET LEADCHNL RV PACING AMPLITUDE: 3.5 V
MDC IDC SET LEADCHNL RV SENSING SENSITIVITY: 0.6 mV
Zone Setting Detection Interval: 273 ms
Zone Setting Detection Interval: 353 ms

## 2013-10-15 NOTE — Progress Notes (Signed)
Wound check appointment.  Wound without redness or edema. Incision edges approximated, wound well healed. Normal device function.  Threshold and impedance slightly than post implant but stable.  We will recheck them in 2 weeks.   Device programmed at 3.5V for extra safety margin until 3 month visit. Histogram distribution appropriate for patient and level of activity. No ventricular arrhythmias noted. Patient educated about wound care, arm mobility, lifting restrictions, shock plan. ROV in 3 months with implanting physician.

## 2013-10-26 ENCOUNTER — Encounter: Payer: Self-pay | Admitting: Internal Medicine

## 2013-10-28 ENCOUNTER — Encounter: Payer: Self-pay | Admitting: Internal Medicine

## 2013-11-22 ENCOUNTER — Ambulatory Visit: Payer: Self-pay | Admitting: Neurology

## 2013-11-22 ENCOUNTER — Other Ambulatory Visit: Payer: Self-pay | Admitting: *Deleted

## 2013-11-22 MED ORDER — ATORVASTATIN CALCIUM 40 MG PO TABS: 40.0000 mg | ORAL_TABLET | Freq: Every day | ORAL | Status: DC

## 2013-11-25 ENCOUNTER — Telehealth: Payer: Self-pay | Admitting: Neurology

## 2013-11-25 ENCOUNTER — Encounter: Payer: Self-pay | Admitting: Neurology

## 2013-11-25 NOTE — Telephone Encounter (Signed)
Pt no showed 11/22/13 follow up with Dr. Allena Katz. Letter mailed to pt / Sherri S.

## 2013-12-04 IMAGING — CR DG CHEST 2V
2 series · 2 of 2 positions shown · non-contrast
Comparison: Chest CTA 08/04/2012 earlier.

CLINICAL DATA: 37-year-old female with shortness of breath.  Recent
aortobifemoral bypass due to with abdominal aortic, pelvic and
lower extremity arterial occlusive disease.  Small renal infarcts.

 CHEST - 2 VIEW

[w chest pa]
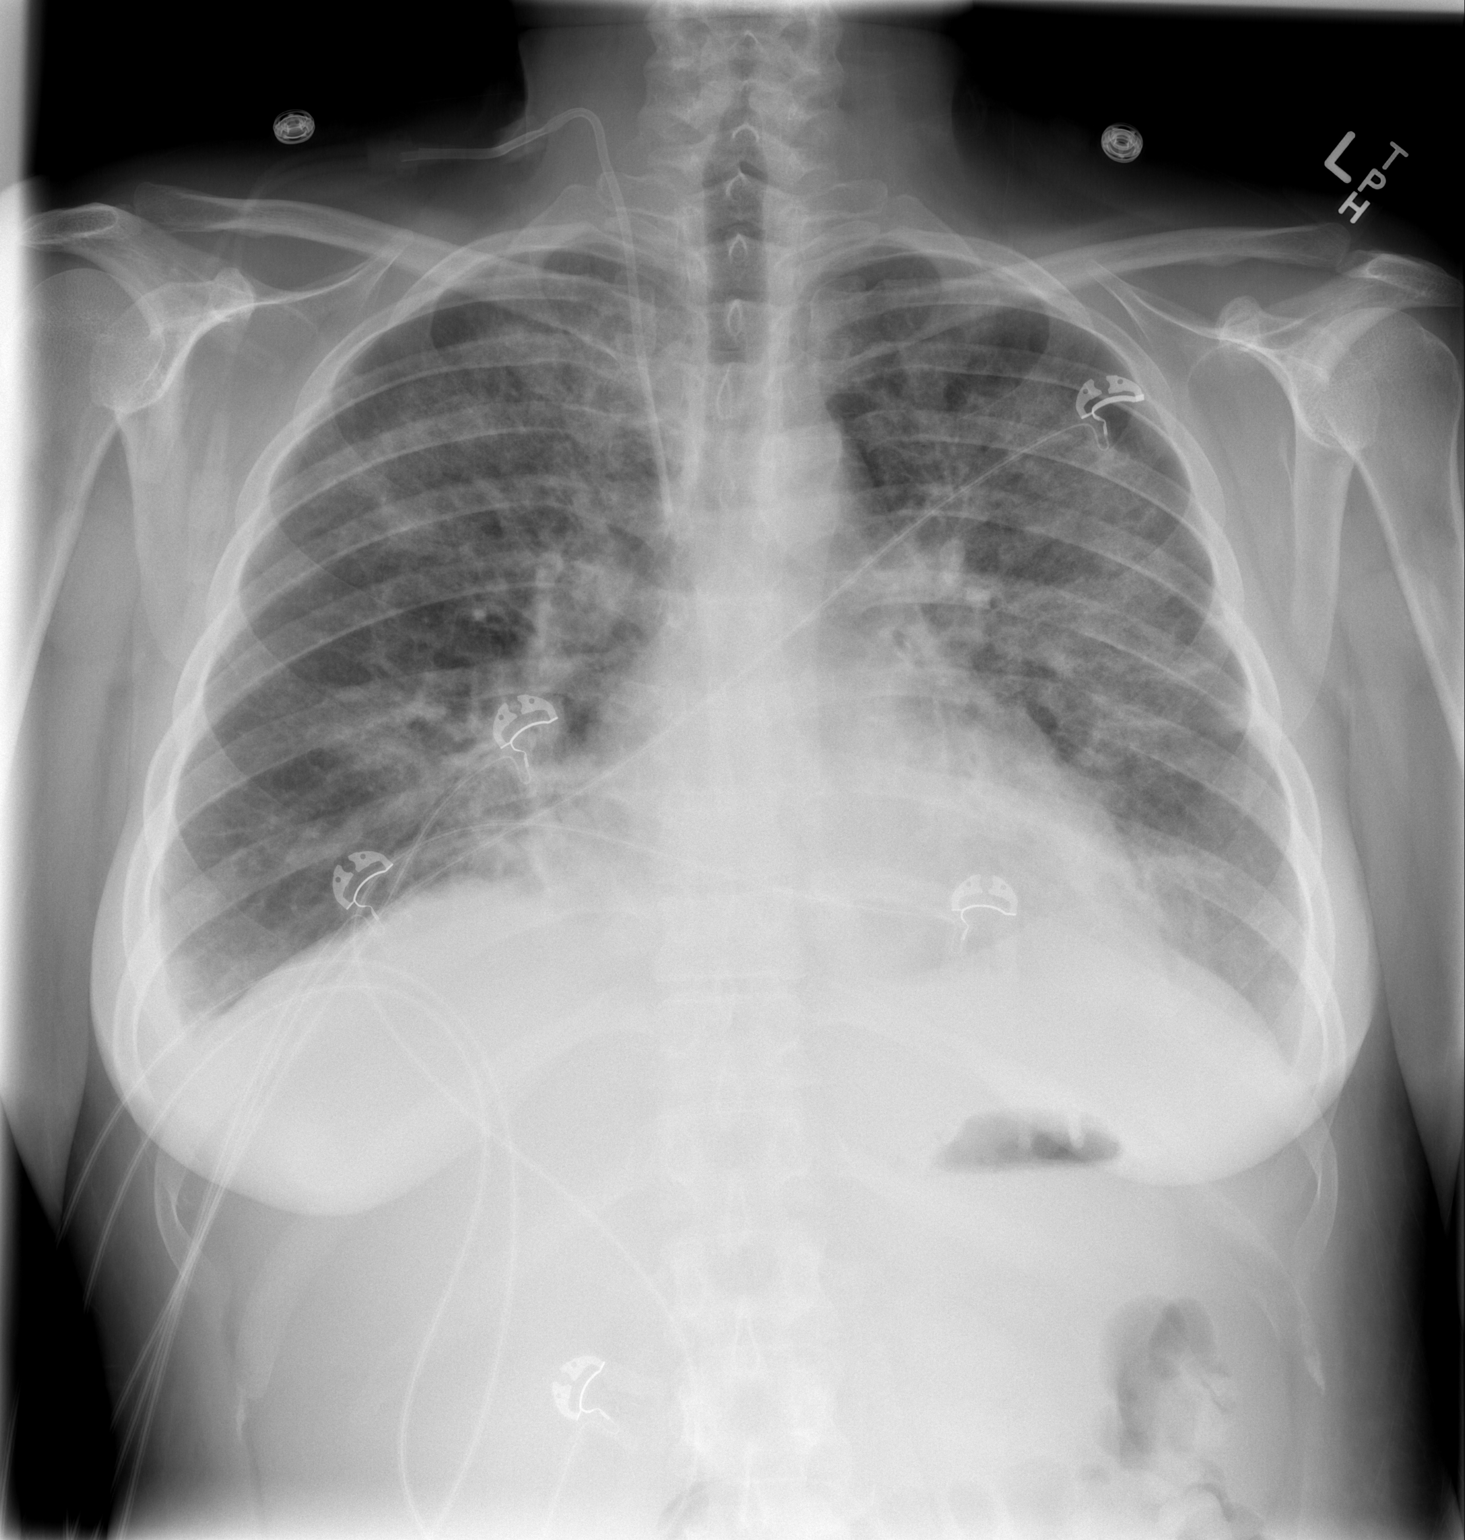

[w chest lat]
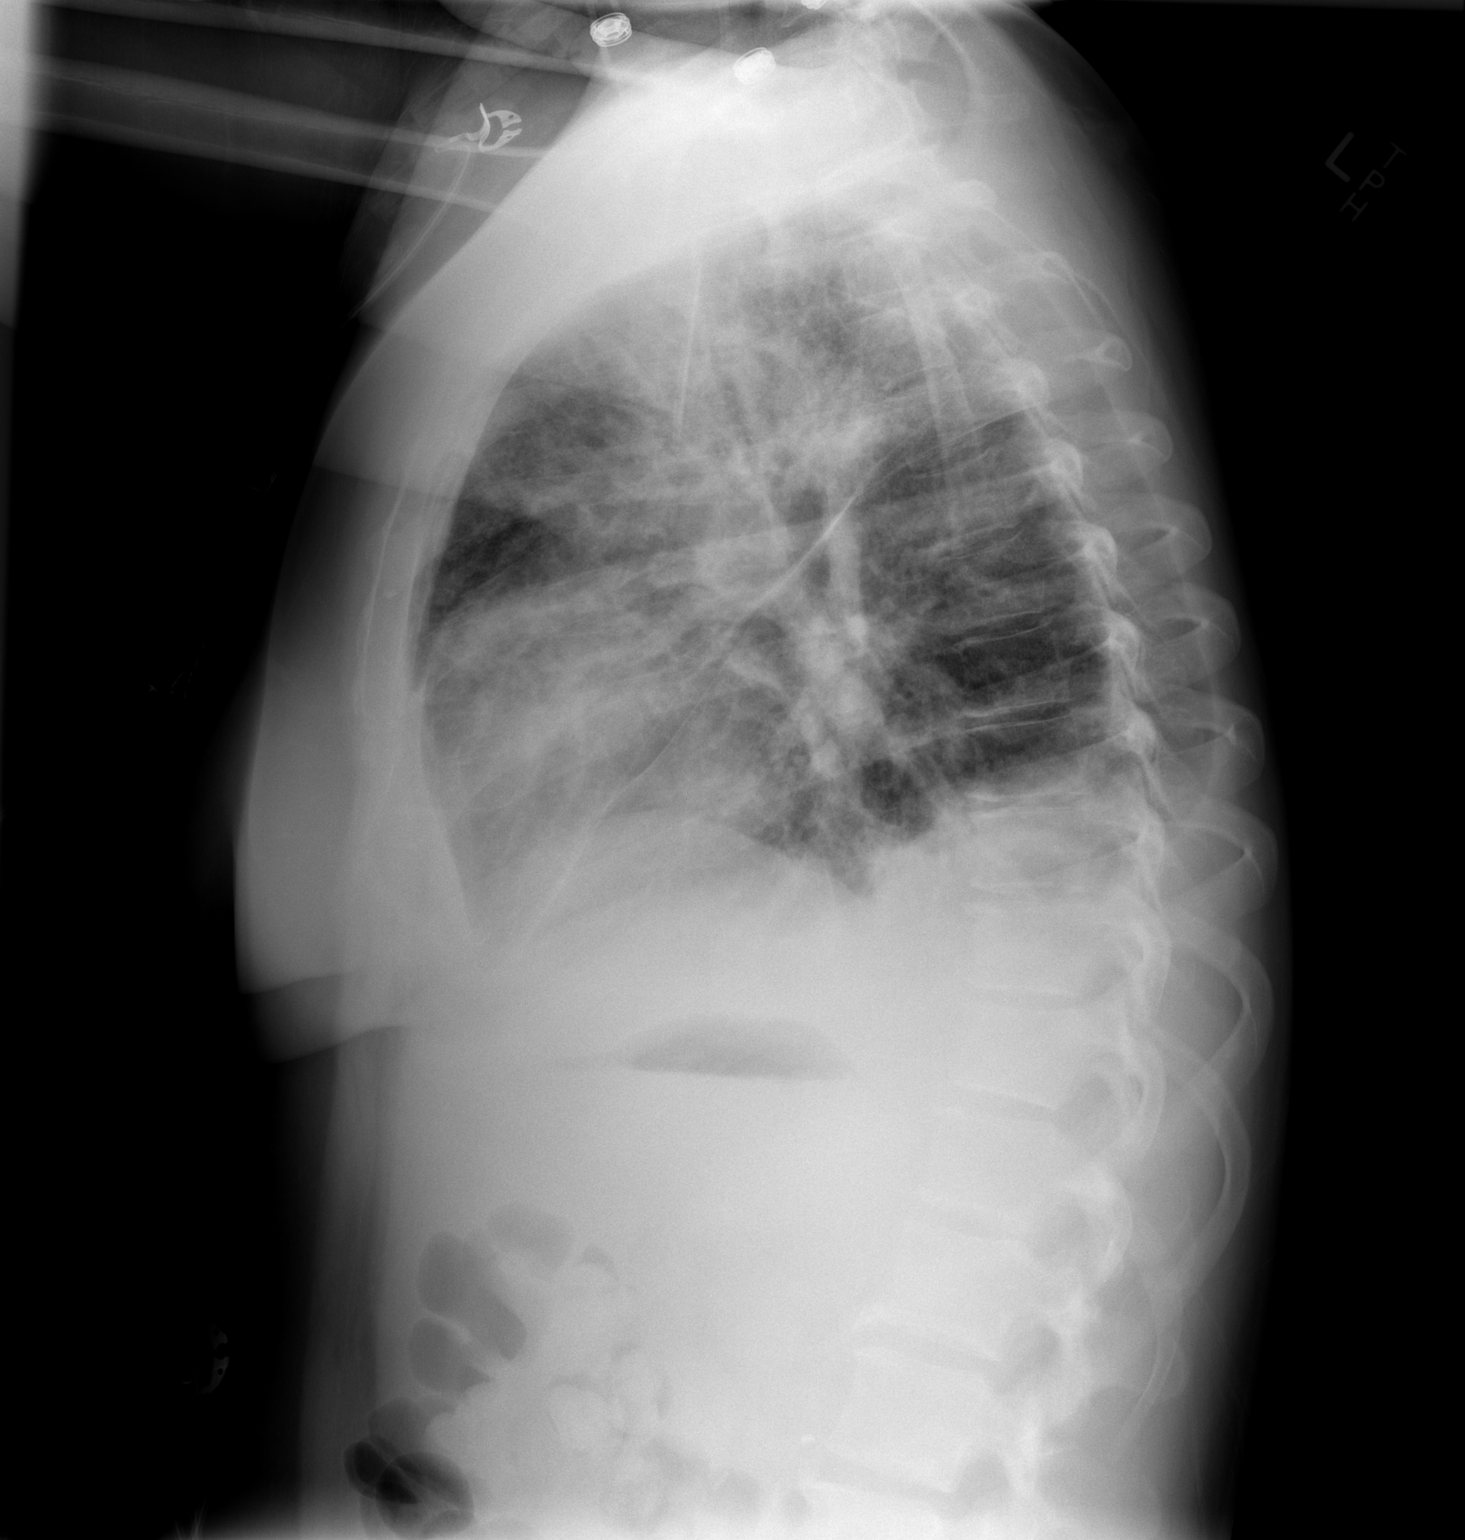

[2 of 2 positions shown; findings below may reference images not displayed]

FINDINGS: Stable right IJ central line.  Continued small bilateral
pleural effusions.  Continued patchy bilateral perihilar opacity.
Overall, no significant improvement since 08/04/2012.  Stable
cardiac size and mediastinal contours.  Visualized tracheal air
column is within normal limits.No acute osseous abnormality
identified.
IMPRESSION: Bilateral pleural effusions and perihilar predominant opacity not
significantly improved since 08/04/2012.

## 2013-12-11 IMAGING — CR DG ABDOMEN ACUTE W/ 1V CHEST
3 series · 3 of 3 positions shown · non-contrast
Comparison: 08/09/2012 radiographs.

CLINICAL DATA: Nausea for 4 days.  History of embolism and
aortobifemoral bypass.

ACUTE ABDOMEN SERIES (ABDOMEN 2 VIEW & CHEST 1 VIEW)

[w chest pa]
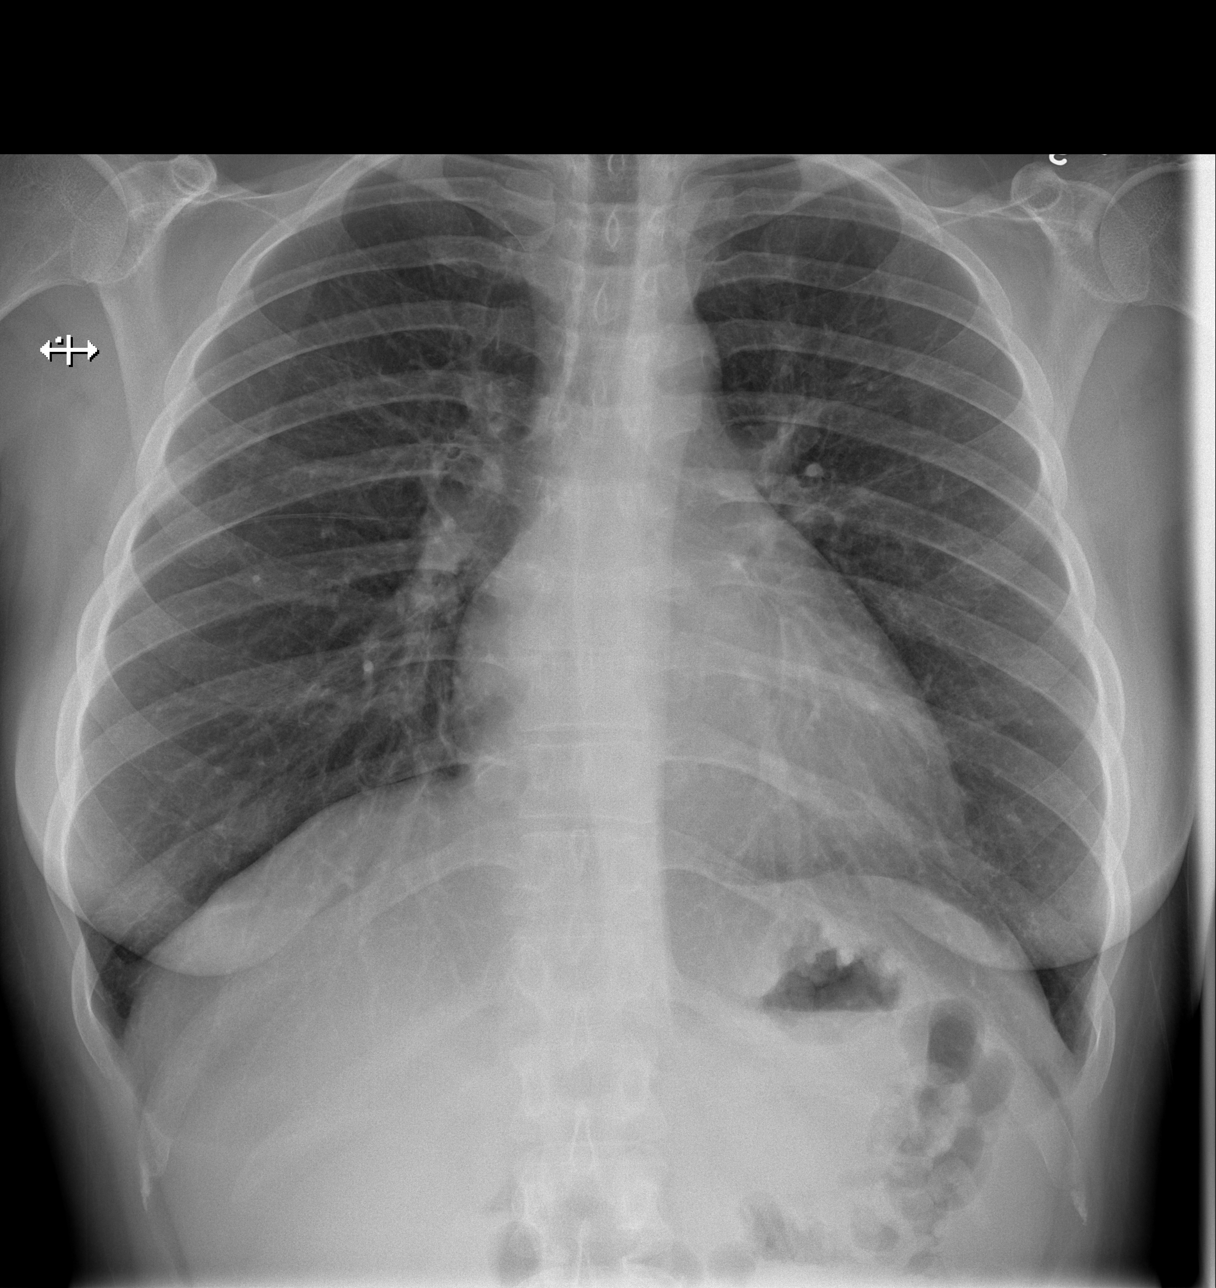

[w abdomen upright]
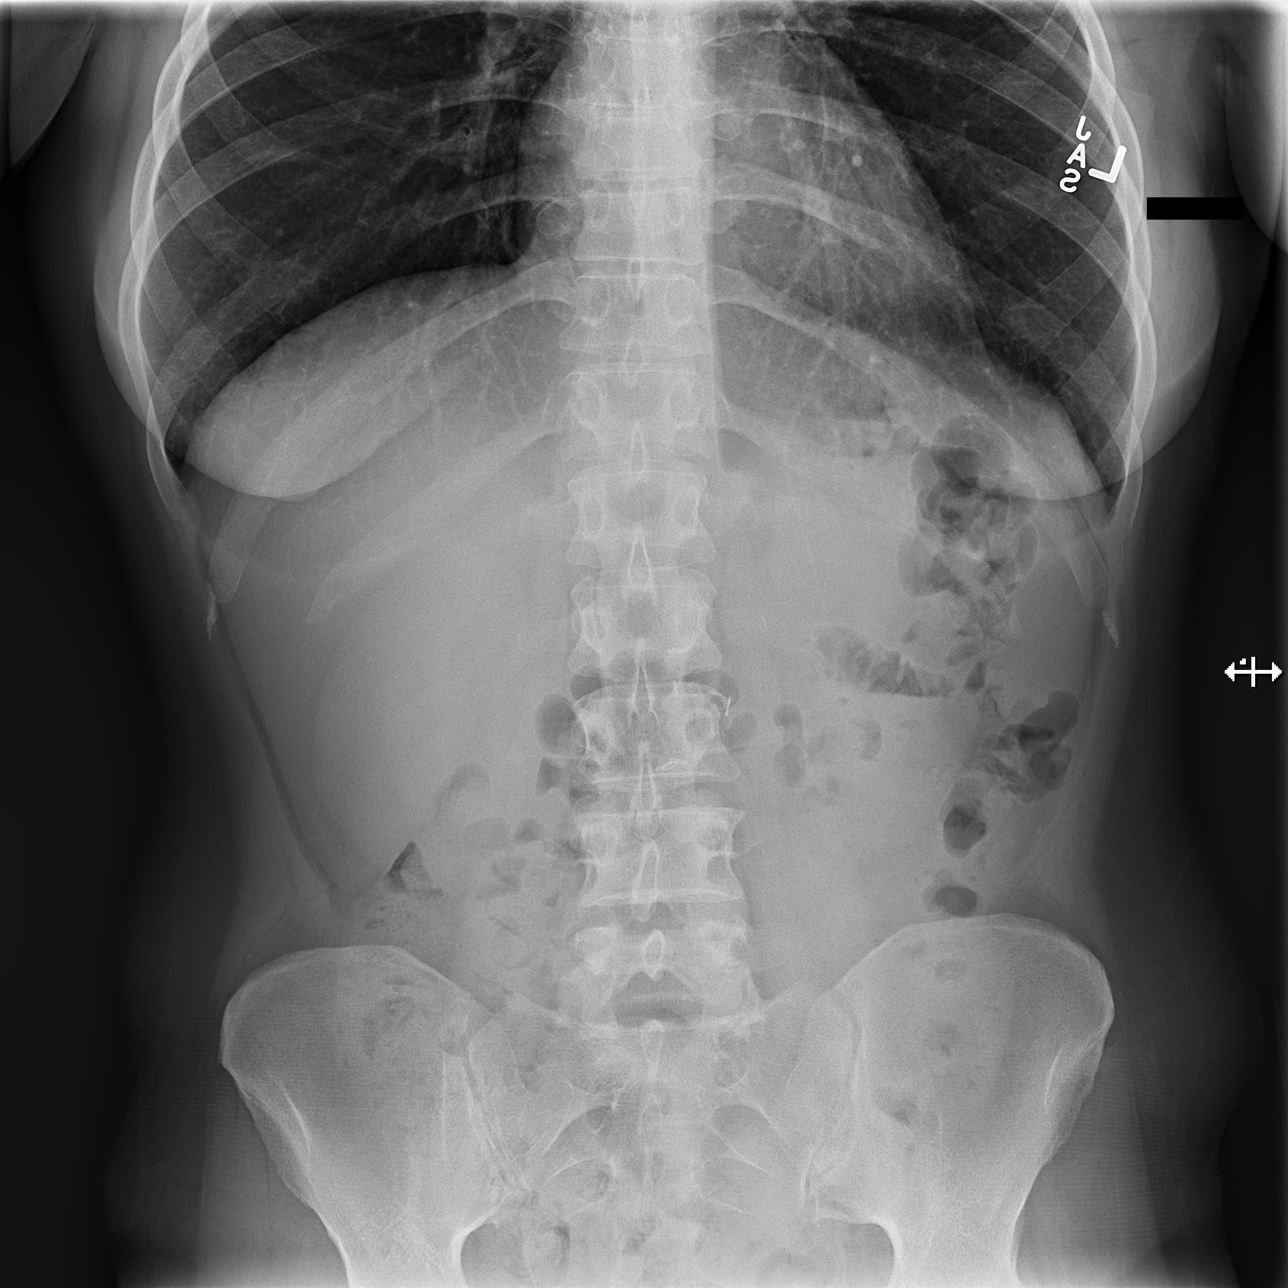

[t abdomen supine]
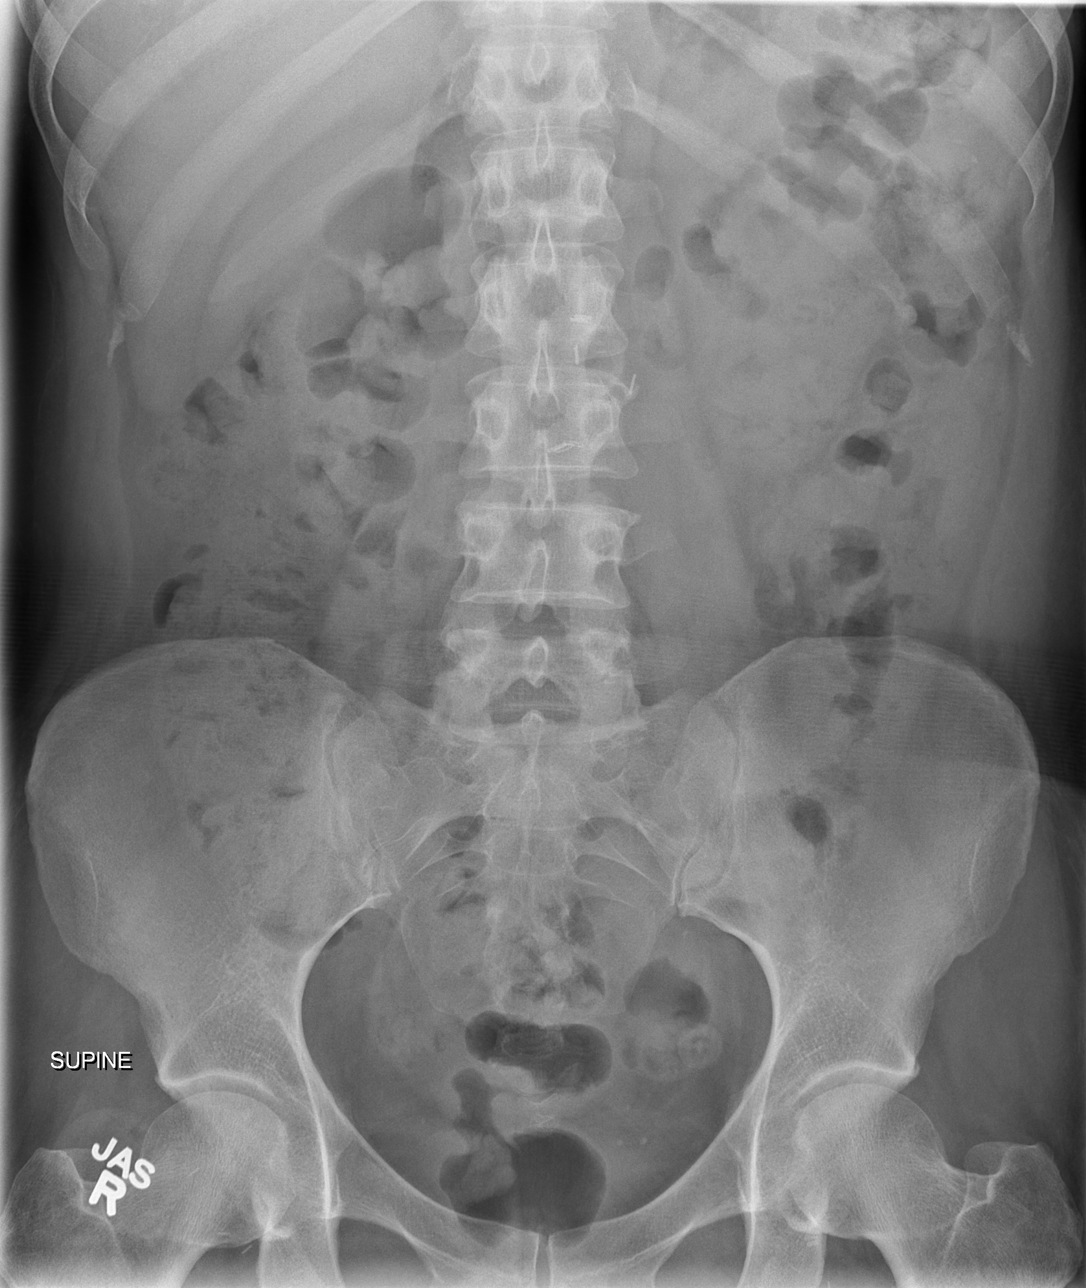

[3 of 3 positions shown; findings below may reference images not displayed]

FINDINGS: The heart size and mediastinal contours are stable.  The
central line has been removed.  The airspace opacities and pleural
effusions noted previously have resolved.  The lungs are now clear.

The bowel gas pattern is normal.  There is no free intraperitoneal
air.  Surgical clips overlie the mid lumbar spine.  Small pelvic
calcifications are likely phleboliths.
IMPRESSION: 1.  Resolution of bilateral air space opacities and pleural
effusions.
2.  No active abdominal process.

## 2014-01-04 ENCOUNTER — Ambulatory Visit (INDEPENDENT_AMBULATORY_CARE_PROVIDER_SITE_OTHER): Payer: Medicaid Other | Admitting: Internal Medicine

## 2014-01-04 ENCOUNTER — Encounter: Payer: Self-pay | Admitting: Internal Medicine

## 2014-01-04 VITALS — BP 136/88 | HR 74 | Ht 65.0 in | Wt 162.0 lb

## 2014-01-04 DIAGNOSIS — T82190A Other mechanical complication of cardiac electrode, initial encounter: Secondary | ICD-10-CM

## 2014-01-04 DIAGNOSIS — Z95810 Presence of automatic (implantable) cardiac defibrillator: Secondary | ICD-10-CM | POA: Diagnosis not present

## 2014-01-04 DIAGNOSIS — T82198A Other mechanical complication of other cardiac electronic device, initial encounter: Secondary | ICD-10-CM | POA: Diagnosis not present

## 2014-01-04 DIAGNOSIS — I509 Heart failure, unspecified: Secondary | ICD-10-CM

## 2014-01-04 DIAGNOSIS — I428 Other cardiomyopathies: Secondary | ICD-10-CM | POA: Diagnosis not present

## 2014-01-04 DIAGNOSIS — I5022 Chronic systolic (congestive) heart failure: Secondary | ICD-10-CM

## 2014-01-04 HISTORY — DX: Other mechanical complication of other cardiac electronic device, initial encounter: T82.198A

## 2014-01-04 HISTORY — DX: Presence of automatic (implantable) cardiac defibrillator: Z95.810

## 2014-01-04 LAB — MDC_IDC_ENUM_SESS_TYPE_INCLINIC
Date Time Interrogation Session: 20150818040000
HighPow Impedance: 77 Ohm
Lead Channel Impedance Value: 959 Ohm
Lead Channel Setting Pacing Pulse Width: 0.8 ms
MDC IDC MSMT LEADCHNL RV PACING THRESHOLD AMPLITUDE: 2.2 V
MDC IDC MSMT LEADCHNL RV PACING THRESHOLD PULSEWIDTH: 1 ms
MDC IDC MSMT LEADCHNL RV SENSING INTR AMPL: 21.2 mV
MDC IDC PG SERIAL: 115386
MDC IDC SET LEADCHNL RV PACING AMPLITUDE: 4 V
MDC IDC SET LEADCHNL RV SENSING SENSITIVITY: 0.6 mV
Zone Setting Detection Interval: 273 ms
Zone Setting Detection Interval: 353 ms
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
## 2014-01-04 NOTE — Addendum Note (Signed)
Addended by: Baird Lyons on: 01/04/2014 05:32 PM   Modules accepted: Orders

## 2014-01-04 NOTE — Patient Instructions (Addendum)
Your physician recommends that you continue on your current medications as directed. Please refer to the Current Medication list given to you today.  A chest x-ray takes a picture of the organs and structures inside the chest, including the heart, lungs, and blood vessels. This test can show several things, including, whether the heart is enlarges; whether fluid is building up in the lungs; and whether pacemaker / defibrillator leads are still in place.  Remote monitoring is used to monitor your ICD from home. This monitoring reduces the number of office visits required to check your device to one time per year. It allows [place] to keep an eye on the functioning of your device to ensure it is working properly. You are scheduled for a device check from home on 04-07-2014. You may send your transmission at any time that day. If you have a wireless device, the transmission will be sent automatically. After your physician reviews your transmission, you will receive a postcard with your next transmission date.  Your physician recommends that you schedule a follow-up appointment in: May 2016 with Dr.Klein

## 2014-01-12 ENCOUNTER — Encounter: Payer: Self-pay | Admitting: Internal Medicine
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
## 2014-01-27 ENCOUNTER — Ambulatory Visit
Admission: RE | Admit: 2014-01-27 | Discharge: 2014-01-27 | Disposition: A | Discharge status: Home / Self Care | Payer: No Typology Code available for payment source | Source: Ambulatory Visit | Attending: Internal Medicine | Admitting: Internal Medicine

## 2014-01-27 ENCOUNTER — Encounter: Payer: Self-pay | Admitting: Internal Medicine

## 2014-01-27 ENCOUNTER — Ambulatory Visit (INDEPENDENT_AMBULATORY_CARE_PROVIDER_SITE_OTHER): Payer: Medicaid Other | Admitting: Internal Medicine

## 2014-01-27 VITALS — BP 132/76 | HR 77 | Ht 65.0 in | Wt 174.0 lb

## 2014-01-27 DIAGNOSIS — I428 Other cardiomyopathies: Secondary | ICD-10-CM

## 2014-01-27 DIAGNOSIS — I739 Peripheral vascular disease, unspecified: Secondary | ICD-10-CM

## 2014-01-27 LAB — CBC
HCT: 35.8 % — ABNORMAL LOW (ref 36.0–46.0)
Hemoglobin: 11.6 g/dL — ABNORMAL LOW (ref 12.0–15.0)
MCHC: 32.2 g/dL (ref 30.0–36.0)
MCV: 85.2 fl (ref 78.0–100.0)
PLATELETS: 318 10*3/uL (ref 150.0–400.0)
RBC: 4.2 Mil/uL (ref 3.87–5.11)
RDW: 16.9 % — AB (ref 11.5–15.5)
WBC: 14.5 10*3/uL — ABNORMAL HIGH (ref 4.0–10.5)

## 2014-01-27 LAB — LIPID PANEL
Cholesterol: 210 mg/dL — ABNORMAL HIGH (ref 0–200)
HDL: 46.6 mg/dL (ref >39.00)
LDL Cholesterol: 146 mg/dL — ABNORMAL HIGH (ref 0–99)
NonHDL: 163.4
TRIGLYCERIDES: 89 mg/dL (ref 0.0–149.0)
Total CHOL/HDL Ratio: 5
VLDL: 17.8 mg/dL (ref 0.0–40.0)

## 2014-01-27 LAB — BASIC METABOLIC PANEL
BUN: 7 mg/dL (ref 6–23)
CALCIUM: 9 mg/dL (ref 8.4–10.5)
CO2: 23 mEq/L (ref 19–32)
Chloride: 107 mEq/L (ref 96–112)
Creatinine, Ser: 0.7 mg/dL (ref 0.4–1.2)
GFR: 99.77 mL/min (ref >60.00)
GLUCOSE: 88 mg/dL (ref 70–99)
Potassium: 4.4 mEq/L (ref 3.5–5.1)
SODIUM: 137 meq/L (ref 135–145)

## 2014-01-27 LAB — AST: AST: 19 U/L (ref 0–37)

## 2014-01-27 LAB — BRAIN NATRIURETIC PEPTIDE: PRO B NATRI PEPTIDE: 247 pg/mL — AB (ref 0.0–100.0)

## 2014-01-27 NOTE — Patient Instructions (Signed)
Your physician recommends that you return for lab work today (CBC, AST, BMET, BNP, LIPID)  Your physician recommends that you have a chest xray.  A chest x-ray takes a picture of the organs and structures inside the chest, including the heart, lungs, and blood vessels. This test can show several things, including, whether the heart is enlarges; whether fluid is building up in the lungs; and whether pacemaker / defibrillator leads are still in place.  Your physician wants you to follow-up around end of January 2016.  You will receive a reminder letter in the mail two months in advance. If you don't receive a letter, please call our office to schedule the follow-up appointment.

## 2014-02-07 NOTE — Progress Notes (Signed)
Left message to call back/lr

## 2014-02-09 ENCOUNTER — Other Ambulatory Visit: Payer: Self-pay | Admitting: Physician Assistant

## 2014-02-22 ENCOUNTER — Telehealth: Payer: Self-pay | Admitting: *Deleted

## 2014-02-22 NOTE — Telephone Encounter (Signed)
Patient states she has an infection in her mouth from a wisdom tooth.

## 2014-02-22 NOTE — Telephone Encounter (Signed)
Her white count has been high because of this.  Adv her to come in to have CBC rechecked. Instructed on mouth infections and risk of systemic infection including endocarditis. Pt had ICD implant in May of this year. She verbalizes understanding and states she is going to the hospital in [place] today to be seen.  They have treated her with antibiotics in the past for this infection.  Also states she had not been taking Lipitor at the time her labs were drawn because she couldn't afford the copay and her medical assistance hadn't gone through yet. She has restarted and will continue to take.   Advised will inform Dr. Tenny Craw.

## 2014-02-22 NOTE — Telephone Encounter (Signed)
Message copied by Lendon Ka on Tue Feb 22, 2014  9:22 AM ------      Message from: Dietrich Pates V      Created: Mon Jan 31, 2014 11:28 PM       Electrolytes are normal      Fluid number is up a little (minimal)  I would not change meds      White count is up  I would repeat CBC with differential and check a UA      Chol is high again/  IS she taking Lipitor?  Was better before. ------

## 2014-04-07 ENCOUNTER — Ambulatory Visit (INDEPENDENT_AMBULATORY_CARE_PROVIDER_SITE_OTHER): Payer: Medicaid Other | Admitting: *Deleted

## 2014-04-07 ENCOUNTER — Encounter: Payer: Self-pay | Admitting: Internal Medicine

## 2014-04-07 ENCOUNTER — Telehealth: Payer: Self-pay | Admitting: Cardiology

## 2014-04-07 DIAGNOSIS — I428 Other cardiomyopathies: Secondary | ICD-10-CM

## 2014-04-07 DIAGNOSIS — I429 Cardiomyopathy, unspecified: Secondary | ICD-10-CM | POA: Diagnosis not present

## 2014-04-07 LAB — MDC_IDC_ENUM_SESS_TYPE_REMOTE
Battery Remaining Longevity: 144 mo
Battery Remaining Percentage: 100 %
Brady Statistic RV Percent Paced: 0 %
HighPow Impedance: 76 Ohm
Implantable Pulse Generator Serial Number: 115386
Lead Channel Pacing Threshold Amplitude: 2.2 V
Lead Channel Sensing Intrinsic Amplitude: 25 mV
Lead Channel Setting Pacing Amplitude: 4 V
Lead Channel Setting Pacing Pulse Width: 0.8 ms
MDC IDC MSMT LEADCHNL RV IMPEDANCE VALUE: 1055 Ohm
MDC IDC MSMT LEADCHNL RV PACING THRESHOLD PULSEWIDTH: 1 ms
MDC IDC SESS DTM: 20151119180532
MDC IDC SET LEADCHNL RV SENSING SENSITIVITY: 0.6 mV
MDC IDC SET ZONE DETECTION INTERVAL: 273 ms
MDC IDC SET ZONE DETECTION INTERVAL: 353 ms

## 2014-04-07 NOTE — Progress Notes (Signed)
Remote ICD transmission.   

## 2014-04-07 NOTE — Telephone Encounter (Signed)
Spoke with pt and reminded pt of remote transmission that is due today. Pt verbalized understanding.   

## 2014-04-25 ENCOUNTER — Telehealth: Payer: Self-pay | Admitting: Internal Medicine

## 2014-04-25 ENCOUNTER — Encounter: Payer: Self-pay | Admitting: Cardiology

## 2014-04-25 NOTE — Telephone Encounter (Signed)
New message  Pt called states her Diana Moreno is looking for a report indicating she has an enlarged heart. Pt states that there is a nurse that has handles this. Doesn't want to speak with the medical records department because there is a particular report she is looking for//sr

## 2014-04-25 NOTE — Telephone Encounter (Signed)
Spoke with patient. States her disability attorney will be contacting Dr. Tenny Craw to request any documentation to support that she is/continues to be a candidate for disability. She requested a copy of a recent xray that was done to check pacemaker lead placement (also identifying her heart is enlarged) She is aware I am forwarding to Dr. Tenny Craw for review, and also will send a message to medical records, as I am not sure we have a signed document for her to receive her records that is current.  Pt verbalizes understanding. She will contact medical records tomorrow for assistance in getting the xray.

## 2014-04-27 NOTE — Telephone Encounter (Signed)
OK to forward records

## 2014-04-28 ENCOUNTER — Encounter (HOSPITAL_COMMUNITY): Payer: Self-pay | Admitting: Cardiovascular Disease

## 2014-05-09 ENCOUNTER — Encounter: Payer: Self-pay | Admitting: Cardiology

## 2014-05-24 ENCOUNTER — Encounter: Payer: Self-pay | Admitting: Internal Medicine

## 2014-06-07 ENCOUNTER — Encounter: Payer: Self-pay | Admitting: Vascular Surgery

## 2014-06-08 ENCOUNTER — Ambulatory Visit (INDEPENDENT_AMBULATORY_CARE_PROVIDER_SITE_OTHER): Payer: Medicaid Other | Admitting: Vascular Surgery

## 2014-06-08 ENCOUNTER — Encounter: Payer: Self-pay | Admitting: Vascular Surgery

## 2014-06-08 ENCOUNTER — Ambulatory Visit (HOSPITAL_COMMUNITY)
Admission: RE | Admit: 2014-06-08 | Discharge: 2014-06-08 | Disposition: A | Discharge status: Home / Self Care | Payer: Medicaid Other | Source: Ambulatory Visit | Attending: Vascular Surgery | Admitting: Vascular Surgery

## 2014-06-08 VITALS — BP 128/73 | HR 70 | Temp 97.8°F | Resp 16 | Ht 65.0 in | Wt 169.0 lb

## 2014-06-08 DIAGNOSIS — I739 Peripheral vascular disease, unspecified: Secondary | ICD-10-CM | POA: Diagnosis not present

## 2014-06-08 DIAGNOSIS — Z48812 Encounter for surgical aftercare following surgery on the circulatory system: Secondary | ICD-10-CM | POA: Diagnosis not present

## 2014-06-08 MED ORDER — AMOXICILLIN-POT CLAVULANATE 875-125 MG PO TABS: 1.0000 | ORAL_TABLET | Freq: Two times a day (BID) | ORAL | Status: DC
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
## 2014-06-09 NOTE — Addendum Note (Signed)
Addended by: MCCHESNEY, MARILYN K on: 06/09/2014 12:39 PM   Modules accepted: Orders  

## 2014-07-07 ENCOUNTER — Ambulatory Visit (INDEPENDENT_AMBULATORY_CARE_PROVIDER_SITE_OTHER): Payer: Medicaid Other | Admitting: *Deleted

## 2014-07-07 DIAGNOSIS — I429 Cardiomyopathy, unspecified: Secondary | ICD-10-CM

## 2014-07-07 DIAGNOSIS — I428 Other cardiomyopathies: Secondary | ICD-10-CM

## 2014-07-07 LAB — MDC_IDC_ENUM_SESS_TYPE_REMOTE
HIGH POWER IMPEDANCE MEASURED VALUE: 77 Ohm
Lead Channel Impedance Value: 1219 Ohm
Lead Channel Pacing Threshold Amplitude: 2.2 V
Lead Channel Pacing Threshold Pulse Width: 1 ms
Lead Channel Setting Pacing Amplitude: 4 V
Lead Channel Setting Sensing Sensitivity: 0.6 mV
MDC IDC MSMT BATTERY REMAINING LONGEVITY: 144 mo
MDC IDC MSMT BATTERY REMAINING PERCENTAGE: 100 %
MDC IDC PG SERIAL: 115386
MDC IDC SESS DTM: 20160218135300
MDC IDC SET LEADCHNL RV PACING PULSEWIDTH: 0.8 ms
MDC IDC SET ZONE DETECTION INTERVAL: 273 ms
MDC IDC SET ZONE DETECTION INTERVAL: 353 ms
MDC IDC STAT BRADY RV PERCENT PACED: 0 %

## 2014-07-07 NOTE — Progress Notes (Signed)
Remote ICD transmission.   

## 2014-07-08 ENCOUNTER — Other Ambulatory Visit: Payer: Self-pay | Admitting: Internal Medicine
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
## 2014-07-11 ENCOUNTER — Encounter: Payer: Self-pay | Admitting: Internal Medicine

## 2014-07-11 ENCOUNTER — Ambulatory Visit (INDEPENDENT_AMBULATORY_CARE_PROVIDER_SITE_OTHER): Payer: Medicaid Other | Admitting: Internal Medicine

## 2014-07-11 VITALS — BP 110/64 | HR 67 | Ht 65.0 in | Wt 175.6 lb

## 2014-07-11 DIAGNOSIS — I509 Heart failure, unspecified: Secondary | ICD-10-CM | POA: Diagnosis not present

## 2014-07-11 DIAGNOSIS — I429 Cardiomyopathy, unspecified: Secondary | ICD-10-CM

## 2014-07-11 DIAGNOSIS — I999 Unspecified disorder of circulatory system: Secondary | ICD-10-CM

## 2014-07-11 DIAGNOSIS — I998 Other disorder of circulatory system: Secondary | ICD-10-CM

## 2014-07-11 DIAGNOSIS — I513 Intracardiac thrombosis, not elsewhere classified: Secondary | ICD-10-CM

## 2014-07-11 DIAGNOSIS — I213 ST elevation (STEMI) myocardial infarction of unspecified site: Secondary | ICD-10-CM | POA: Diagnosis not present

## 2014-07-11 MED ORDER — ATORVASTATIN CALCIUM 40 MG PO TABS: 40.0000 mg | ORAL_TABLET | Freq: Every day | ORAL | Status: DC

## 2014-07-11 NOTE — Patient Instructions (Addendum)
Your physician recommends that you continue on your current medications as directed. Please refer to the Current Medication list given to you today.  Your physician recommends that you return for lab work for lipids, ast, bmet, cbc.  Your physician has requested that you have an echocardiogram. Echocardiography is a painless test that uses sound waves to create images of your heart. It provides your doctor with information about the size and shape of your heart and how well your heart's chambers and valves are working. This procedure takes approximately one hour. There are no restrictions for this procedure.  You have been referred to Dr. Kristin Bruins [facility]) [phone] You have been referred to Dr. Sara Swaziland [facility]) [phone] Please call and schedule appointments as soon as possible.   Your physician wants you to follow-up in: 6 months with Dr. Tenny Craw.  You will receive a reminder letter in the mail two months in advance. If you don't receive a letter, please call our office to schedule the follow-up appointment.

## 2014-07-13 ENCOUNTER — Telehealth: Payer: Self-pay | Admitting: Internal Medicine

## 2014-07-13 NOTE — Telephone Encounter (Signed)
New message      Pt want Dr Tenny Craw to refer her to see Dr Kristin Bruins, a dentist at [facility].  There number is [phone].  Please let pt know when referral has been made so that she can make an appt

## 2014-07-19 ENCOUNTER — Other Ambulatory Visit: Payer: Self-pay

## 2014-07-19 DIAGNOSIS — I429 Cardiomyopathy, unspecified: Secondary | ICD-10-CM

## 2014-07-19 NOTE — Telephone Encounter (Signed)
Not sure what else I am supposed to do

## 2014-07-19 NOTE — Telephone Encounter (Signed)
Referral sent for Dr. Art Buff. Informed patient referral has been sent. Patient requesting referral for Sharon Swaziland PA also. Will forward to Dr. Tenny Craw for instructions.

## 2014-07-19 NOTE — Telephone Encounter (Signed)
Yes, please refer patinet to Dr Kristin Bruins

## 2014-07-21 NOTE — Telephone Encounter (Signed)
Left message for patient to call and make any appointment with PCP of her choice, no referral needed.

## 2014-07-26 ENCOUNTER — Telehealth: Payer: Self-pay | Admitting: Internal Medicine

## 2014-07-26 NOTE — Telephone Encounter (Signed)
New Message  Pt requests a call back to discuss the possibility of enrolling is Cardiac Rehab// Please call

## 2014-07-26 NOTE — Telephone Encounter (Signed)
Patient has been researching recently.  She knows it has been a 2 year process since things began, but she wants to be more proactive. She has been researching recently and wants to know if she would qualify for cardiac rehab to see if it can help her symptoms.   Her SOB has worsened. ADLs are somewhat affected.  She sometimes has to stop 1/2 way going up a flight of stairs secondary to SOB -  This includes in her own house which has 2 sets of stairs.  She cannot vacuum/sweep/mop but one room at a time.  She will vacuum and need to sit down after one room. She reports no other symptoms. She has an echo on 3/11 and understands that we will need to see results of that to help determine possible qualifications for rehab.

## 2014-07-27 ENCOUNTER — Encounter: Payer: Self-pay | Admitting: *Deleted

## 2014-07-27 ENCOUNTER — Telehealth: Payer: Self-pay | Admitting: Internal Medicine

## 2014-07-27 NOTE — Telephone Encounter (Signed)
[facility]  There is no Dr. Swaziland there. They did set her with a new patient appointment, sending paperwork in the mail, after they receive it, they will call her with appointment date/time.  Advised her that the [facility] is the place Dr. Tenny Craw wants her to see a PCP, so whoever will be available to see her as a new patient will be okay.  Pt verbalizes understanding and agreement.

## 2014-07-27 NOTE — Telephone Encounter (Signed)
New message     Pt said Dr Tenny Craw gave her the name of Dr Huntley Dec Jordan---a PCP.  She cannot find her in the phone book.  She wanted to double check the name so that she can make an appt.  Please ask her again what PCP she recommend

## 2014-07-28 ENCOUNTER — Other Ambulatory Visit (INDEPENDENT_AMBULATORY_CARE_PROVIDER_SITE_OTHER): Payer: Medicaid Other | Admitting: *Deleted

## 2014-07-28 ENCOUNTER — Encounter: Payer: Self-pay | Admitting: Internal Medicine

## 2014-07-28 DIAGNOSIS — I513 Intracardiac thrombosis, not elsewhere classified: Secondary | ICD-10-CM

## 2014-07-28 DIAGNOSIS — I999 Unspecified disorder of circulatory system: Secondary | ICD-10-CM

## 2014-07-28 DIAGNOSIS — I739 Peripheral vascular disease, unspecified: Secondary | ICD-10-CM

## 2014-07-28 DIAGNOSIS — I429 Cardiomyopathy, unspecified: Secondary | ICD-10-CM | POA: Diagnosis not present

## 2014-07-28 DIAGNOSIS — I213 ST elevation (STEMI) myocardial infarction of unspecified site: Secondary | ICD-10-CM

## 2014-07-28 DIAGNOSIS — I998 Other disorder of circulatory system: Secondary | ICD-10-CM

## 2014-07-28 LAB — CBC WITH DIFFERENTIAL/PLATELET
BASOS ABS: 0.1 10*3/uL (ref 0.0–0.1)
BASOS PCT: 0.4 % (ref 0.0–3.0)
EOS ABS: 0.1 10*3/uL (ref 0.0–0.7)
Eosinophils Relative: 0.5 % (ref 0.0–5.0)
HEMATOCRIT: 37.6 % (ref 36.0–46.0)
HEMOGLOBIN: 12.6 g/dL (ref 12.0–15.0)
LYMPHS PCT: 17.9 % (ref 12.0–46.0)
Lymphs Abs: 3.1 10*3/uL (ref 0.7–4.0)
MCHC: 33.6 g/dL (ref 30.0–36.0)
MCV: 85.4 fl (ref 78.0–100.0)
MONO ABS: 1 10*3/uL (ref 0.1–1.0)
MONOS PCT: 5.6 % (ref 3.0–12.0)
NEUTROS ABS: 13.3 10*3/uL — AB (ref 1.4–7.7)
NEUTROS PCT: 75.6 % (ref 43.0–77.0)
PLATELETS: 253 10*3/uL (ref 150.0–400.0)
RBC: 4.4 Mil/uL (ref 3.87–5.11)
RDW: 17.6 % — ABNORMAL HIGH (ref 11.5–15.5)
WBC: 17.6 10*3/uL — AB (ref 4.0–10.5)

## 2014-07-28 LAB — AST: AST: 14 U/L (ref 0–37)

## 2014-07-28 LAB — BASIC METABOLIC PANEL
BUN: 7 mg/dL (ref 6–23)
CO2: 26 meq/L (ref 19–32)
CREATININE: 0.83 mg/dL (ref 0.40–1.20)
Calcium: 9.5 mg/dL (ref 8.4–10.5)
Chloride: 110 mEq/L (ref 96–112)
GFR: 81.75 mL/min (ref >60.00)
Glucose, Bld: 97 mg/dL (ref 70–99)
POTASSIUM: 4.3 meq/L (ref 3.5–5.1)
Sodium: 140 mEq/L (ref 135–145)

## 2014-07-28 LAB — LIPID PANEL
CHOL/HDL RATIO: 3
Cholesterol: 126 mg/dL (ref 0–200)
HDL: 38.7 mg/dL — AB (ref >39.00)
LDL Cholesterol: 72 mg/dL (ref 0–99)
NonHDL: 87.3
Triglycerides: 77 mg/dL (ref 0.0–149.0)
VLDL: 15.4 mg/dL (ref 0.0–40.0)

## 2014-07-29 ENCOUNTER — Ambulatory Visit (HOSPITAL_COMMUNITY): Payer: Medicaid Other | Attending: Internal Medicine | Admitting: Radiology

## 2014-07-29 ENCOUNTER — Other Ambulatory Visit: Payer: Self-pay

## 2014-07-29 ENCOUNTER — Telehealth: Payer: Self-pay | Admitting: *Deleted

## 2014-07-29 ENCOUNTER — Other Ambulatory Visit (INDEPENDENT_AMBULATORY_CARE_PROVIDER_SITE_OTHER): Payer: Medicaid Other | Admitting: *Deleted

## 2014-07-29 DIAGNOSIS — I429 Cardiomyopathy, unspecified: Secondary | ICD-10-CM | POA: Diagnosis not present

## 2014-07-29 DIAGNOSIS — I739 Peripheral vascular disease, unspecified: Secondary | ICD-10-CM

## 2014-07-29 DIAGNOSIS — I509 Heart failure, unspecified: Secondary | ICD-10-CM

## 2014-07-29 DIAGNOSIS — I213 ST elevation (STEMI) myocardial infarction of unspecified site: Secondary | ICD-10-CM | POA: Diagnosis not present

## 2014-07-29 DIAGNOSIS — I998 Other disorder of circulatory system: Secondary | ICD-10-CM

## 2014-07-29 DIAGNOSIS — I999 Unspecified disorder of circulatory system: Secondary | ICD-10-CM | POA: Diagnosis not present

## 2014-07-29 DIAGNOSIS — Z720 Tobacco use: Secondary | ICD-10-CM | POA: Insufficient documentation

## 2014-07-29 DIAGNOSIS — I513 Intracardiac thrombosis, not elsewhere classified: Secondary | ICD-10-CM

## 2014-07-29 DIAGNOSIS — D72829 Elevated white blood cell count, unspecified: Secondary | ICD-10-CM

## 2014-07-29 LAB — CBC WITH DIFFERENTIAL/PLATELET
BASOS ABS: 0 10*3/uL (ref 0.0–0.1)
Basophils Relative: 0.4 % (ref 0.0–3.0)
Eosinophils Absolute: 0.1 10*3/uL (ref 0.0–0.7)
Eosinophils Relative: 1 % (ref 0.0–5.0)
HCT: 38 % (ref 36.0–46.0)
HEMOGLOBIN: 12.5 g/dL (ref 12.0–15.0)
LYMPHS ABS: 2.4 10*3/uL (ref 0.7–4.0)
Lymphocytes Relative: 22 % (ref 12.0–46.0)
MCHC: 32.9 g/dL (ref 30.0–36.0)
MCV: 86.1 fl (ref 78.0–100.0)
MONO ABS: 0.8 10*3/uL (ref 0.1–1.0)
Monocytes Relative: 7.4 % (ref 3.0–12.0)
Neutro Abs: 7.5 10*3/uL (ref 1.4–7.7)
Neutrophils Relative %: 69.2 % (ref 43.0–77.0)
Platelets: 249 10*3/uL (ref 150.0–400.0)
RBC: 4.42 Mil/uL (ref 3.87–5.11)
RDW: 17.8 % — AB (ref 11.5–15.5)
WBC: 10.9 10*3/uL — ABNORMAL HIGH (ref 4.0–10.5)

## 2014-07-29 LAB — SEDIMENTATION RATE: SED RATE: 19 mm/h (ref 0–22)

## 2014-07-29 MED ORDER — PERFLUTREN LIPID MICROSPHERE
1.0000 mL | INTRAVENOUS | Status: DC | PRN
  Administered 2014-07-29: 3 mL via INTRAVENOUS

## 2014-07-29 NOTE — Addendum Note (Signed)
Addended by: Tonita Phoenix on: 07/29/2014 04:06 PM   Modules accepted: Orders

## 2014-07-29 NOTE — Addendum Note (Signed)
Addended by: Tonita Phoenix on: 07/29/2014 04:07 PM   Modules accepted: Orders

## 2014-07-29 NOTE — Addendum Note (Signed)
Addended by: Tonita Phoenix on: 07/29/2014 03:07 PM   Modules accepted: Orders

## 2014-07-29 NOTE — Progress Notes (Signed)
Echocardiogram performed with Definity.  

## 2014-07-29 NOTE — Telephone Encounter (Signed)
-----  Message from Fay Records, MD sent at 07/28/2014  4:03 PM EST ----- Lipds are excellent  Keep on meds  WBC is up again!! Needs UA  Would get ESR  REpeat CBC

## 2014-07-29 NOTE — Telephone Encounter (Signed)
Patient informed. Has echo today so will come for labs and UA She reports some low back pain, but also feels infection is r/t her teeth.  She has been unable to get an appointment at the dental clinic so far.  States Dr. Edilia Bo at [facility] started her on antibiotics---amoxicillin--is unclear on how to take.  Took 10 days worth and stopped. Has 2 separate prescriptions for 10 more days each at [facility].  States it took her a while to get money together to pick up medication. Advised her it is on the $4 list at [facility] so it should not cost any more than that if it is definitely amoxicillin.

## 2014-07-29 NOTE — Addendum Note (Signed)
Addended by: BOWDEN, ROBIN K on: 07/29/2014 04:07 PM   Modules accepted: Orders  

## 2014-07-29 NOTE — Addendum Note (Signed)
Addended by: Tonita Phoenix on: 07/29/2014 03:06 PM   Modules accepted: Orders

## 2014-07-30 LAB — URINALYSIS
Bilirubin Urine: NEGATIVE
GLUCOSE, UA: NEGATIVE mg/dL
Hgb urine dipstick: NEGATIVE
Ketones, ur: NEGATIVE mg/dL
Leukocytes, UA: NEGATIVE
NITRITE: NEGATIVE
PH: 5 (ref 5.0–8.0)
Protein, ur: NEGATIVE mg/dL
Specific Gravity, Urine: 1.01 (ref 1.005–1.030)
Urobilinogen, UA: 0.2 mg/dL (ref 0.0–1.0)

## 2014-08-02 NOTE — Telephone Encounter (Signed)
I placed call to Dental office re appt  They will have Dr Kristin Bruins review data

## 2014-08-15 NOTE — Telephone Encounter (Signed)
Left detailed message explaining - Dr. Graciela Husbands is referring decision to Dr Tenny Craw.   Dr. Tenny Craw reviewed on Friday, stating she would take care of this when she returns to the office week after next. I explained this on patient's personal voicemail and told her I would call her next week when completed. Asked her to call back if she had questions/concerns.

## 2014-08-16 NOTE — Telephone Encounter (Signed)
Patient called back to verify that Dr. Tenny Craw would take a look into this next week. Informed patient that was correct and we would address this next week. She is agreeable and voiced understanding.

## 2014-08-17 ENCOUNTER — Encounter (HOSPITAL_COMMUNITY): Payer: Self-pay | Admitting: Dentistry

## 2014-08-17 ENCOUNTER — Ambulatory Visit (HOSPITAL_COMMUNITY): Payer: Self-pay | Admitting: Dentistry

## 2014-08-17 VITALS — BP 130/73 | HR 72 | Temp 98.0°F

## 2014-08-17 DIAGNOSIS — K029 Dental caries, unspecified: Secondary | ICD-10-CM

## 2014-08-17 DIAGNOSIS — K032 Erosion of teeth: Secondary | ICD-10-CM

## 2014-08-17 DIAGNOSIS — K0530 Chronic periodontitis, unspecified: Secondary | ICD-10-CM

## 2014-08-17 DIAGNOSIS — K083 Retained dental root: Secondary | ICD-10-CM

## 2014-08-17 DIAGNOSIS — I5022 Chronic systolic (congestive) heart failure: Secondary | ICD-10-CM

## 2014-08-17 DIAGNOSIS — M2635 Rotation of fully erupted tooth or teeth: Secondary | ICD-10-CM

## 2014-08-17 DIAGNOSIS — I429 Cardiomyopathy, unspecified: Secondary | ICD-10-CM

## 2014-08-17 DIAGNOSIS — M264 Malocclusion, unspecified: Secondary | ICD-10-CM

## 2014-08-17 DIAGNOSIS — K036 Deposits [accretions] on teeth: Secondary | ICD-10-CM

## 2014-08-17 MED ORDER — CHLORHEXIDINE GLUCONATE 0.12 % MT SOLN: OROMUCOSAL | Status: DC
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
## 2014-08-17 NOTE — Patient Instructions (Addendum)
The patient is to contact Dr. Tenny Craw or her new primary physician to obtain a history of physical type examination prior to scheduling of the dental operating room procedure. The patient will then be scheduled for operating room procedure as time and space permits. In the meantime, the patient is to use chlorhexidine rinses twice daily as instructed. Prescription was sent to [facility] pharmacy. Dr. Kristin Bruins      HEART VALVES AND MOUTH CARE:  FACTS:   If you have any infection in your mouth, it can infect your heart valve.  If you heart valve is infected, you will be seriously ill.  Infections in the mouth can be SILENT and do not always cause pain.  Examples of infections in the mouth are gum disease, dental cavities, and abscesses.  Some possible signs of infection are: Bad breath, bleeding gums, or teeth that are sensitive to sweets, hot, and/or cold. There are many other signs as well.  WHAT YOU HAVE TO DO:   Brush your teeth after meals and at bedtime. Spend at least 2 minutes brushing well, especially behind your back teeth and all around your teeth that stand alone. Brush at the gumline also.  Do not go to bed without brushing your teeth and flossing.  If you gums bleed when you brush or floss, do NOT stop brushing or flossing. It usually means that your gums need more attention and better cleaning.   If your Dentist or Dr. Kristin Bruins gave you a prescription mouthwash to use, make sure to use it as directed. If you run out of the medication, get a refill at the pharmacy.   If you were given any other medications or directions by your Dentist, please follow them. If you did not understand the directions or forget what you were told, please call. We will be happy to refresh her memory.  If you need antibiotics before dental procedures, make sure you take them one hour prior to every dental visit as directed.   Get a dental checkup every 4-6 months in order to keep your mouth  healthy, or to find and treat any new infection. You will most likely need your teeth cleaned or gums treated at the same time.  If you are not able to come in for your scheduled appointment, call your Dentist as soon as possible to reschedule.  If you have a problem in between dental visits, call your Dentist.

## 2014-08-17 NOTE — Telephone Encounter (Signed)
Pt calling back re having dental surgery--teeth removal-needs ok from dr Tenny Craw

## 2014-08-18 NOTE — Telephone Encounter (Signed)
Left message that dental office needs to send request for clearance for teeth extraction.

## 2014-08-19 NOTE — Telephone Encounter (Signed)
Dr. Verdie Shire DDS called from [facility] to speak with Dr Tenny Craw or nurse in regards to this pt needing an updated History and physical type examination done prior to the scheduling of this pts dental operating room procedure.  Per Dr. Verdie Shire DDS, after the updated history and physical type examination is complete, the pt will be scheduled for operating room procedure, as time and space permits.  Per Dr. Verdie Shire DDS the pt will undergo general anesthesia, so an updated H&P (predominately of the heart and lungs) must be completed by Dr Tenny Craw or an extender in our group.  Informed Dr. Verdie Shire DDS that Dr Tenny Craw and nurse are both out of the office, but I will send them both a message for further review of this message, follow-up promptly with him through epic thereafter.  Dr. Verdie Shire DDS verbalized understanding and agrees with this plan.  Dr Tenny Craw and nurse to return to the office on Monday 08/22/14, and can make the determination of seeing the pt themselves, or having an extender see the pt.

## 2014-08-22 ENCOUNTER — Telehealth: Payer: Self-pay | Admitting: Internal Medicine

## 2014-08-22 ENCOUNTER — Other Ambulatory Visit: Payer: Self-pay

## 2014-08-22 DIAGNOSIS — I420 Dilated cardiomyopathy: Secondary | ICD-10-CM

## 2014-08-22 MED ORDER — CARVEDILOL 3.125 MG PO TABS: 3.1250 mg | ORAL_TABLET | Freq: Two times a day (BID) | ORAL | Status: DC

## 2014-08-22 NOTE — Telephone Encounter (Signed)
Discussed that Dr. Nelly Rout has sent request. Pt sent paperwork to PCP who she is planning to see. Originally they wouldn't be able to see her for 3 months. Now, they will try to see her sooner, since she needs a dental referral.  Asked patient to contact the office when she gets a PCP appointment.--They may be able to do the exam prior to dental surgery.

## 2014-08-22 NOTE — Telephone Encounter (Signed)
Follow Up  Pt herself called asked if Dr. Nelly Rout with [facility] sent the information that was needed. Please call back to confirm

## 2014-08-29 NOTE — Telephone Encounter (Signed)
Called patient to see if she is available to see Dr. Tenny Craw today.  No answer--left message to call back.  There is currently no availability on PA/NP schedule for the rest of this month.

## 2014-08-29 NOTE — Telephone Encounter (Signed)
Patient needs a new H and P     I could see her next time I am in clinic (sched asa sBP check) and I will do H and P    Or she can be set upwith PA    ----- Message -----     From: Charlynne Pander, DDS     Sent: 08/17/2014 10:14 AM      To: Chuck Hint, MD, Pricilla Riffle, MD        Paula/Chris:    Sorry for the delay in getting this patient in.    Patient wishes to proceed with extraction of tooth numbers 1 and 16 with alveoloplasty and gross debridement of remaining dentition in the operating room with Gen. anesthesia.    I will need a new medical note to be used for H and P purposes for dental OR. This can be done by cardiology/vascular PA or new primary physician.     Dr. Kristin Bruins

## 2014-08-30 NOTE — Telephone Encounter (Signed)
F/u   Pt stated she was returning a call from nurse.

## 2014-08-30 NOTE — Telephone Encounter (Signed)
Called patient back to let her know why Michalene, Dr. Charlott Rakes nurse, was calling. Informed her that I would forward this message to [facility] and she would be in contact with her later this week. Patient verbalized understanding.

## 2014-08-31 NOTE — Telephone Encounter (Signed)
Called patient back with opening this Friday with Dr. Tenny Craw 9:30 am. Scheduled patient for visit.  Needs H and P updated for dental surgery. Pt aware.

## 2014-09-02 ENCOUNTER — Ambulatory Visit (INDEPENDENT_AMBULATORY_CARE_PROVIDER_SITE_OTHER): Payer: Medicaid Other | Admitting: Internal Medicine

## 2014-09-02 ENCOUNTER — Encounter: Payer: Self-pay | Admitting: Internal Medicine

## 2014-09-02 VITALS — BP 136/76 | HR 56 | Ht 65.0 in | Wt 168.4 lb

## 2014-09-02 DIAGNOSIS — I5022 Chronic systolic (congestive) heart failure: Secondary | ICD-10-CM

## 2014-09-02 DIAGNOSIS — E785 Hyperlipidemia, unspecified: Secondary | ICD-10-CM | POA: Diagnosis not present

## 2014-09-02 NOTE — Patient Instructions (Signed)
Medication Instructions:  Your physician recommends that you continue on your current medications as directed. Please refer to the Current Medication list given to you today.   Labwork: NONE  Testing/Procedures: NONE   Follow-Up:  FOLLOW UP WITH DR ROSS IN AUGUST AS PLANNED  Any Other Special Instructions Will Be Listed Below (If Applicable).  A REFERRAL IS BEING SENT TO CARDIAC REHABILITATION.  YOU WILL BE CONTACTED WHEN IT IS TIME TO SCHEDULE INITIAL EVALUATION.
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
## 2014-09-07 ENCOUNTER — Telehealth: Payer: Self-pay | Admitting: Internal Medicine

## 2014-09-07 NOTE — Telephone Encounter (Signed)
New Message        Pt calling stating that she is calling to follow up with Dr. Tenny Craw in regards to the situation with her tooth and Dr. Verdie Shire she states that Dr. Tenny Craw told her to call back if she didn't hear back from our office by Monday. Pt also states she has become homeless in the past 48 hours and she wants to speak to [facility] about what this means in regards to her upcoming surgery. Please call back and advise.

## 2014-09-07 NOTE — Telephone Encounter (Signed)
Left message to call back  

## 2014-09-08 NOTE — Telephone Encounter (Signed)
Late Entry: This was given to [facility]  On 4/14 (Dr. Charlott Rakes nurse) to address with Dr. Tenny Craw.

## 2014-09-12 ENCOUNTER — Telehealth (HOSPITAL_COMMUNITY): Payer: Self-pay | Admitting: Dentistry

## 2014-09-12 ENCOUNTER — Other Ambulatory Visit (HOSPITAL_COMMUNITY): Payer: Self-pay | Admitting: Dentistry

## 2014-09-12 NOTE — Telephone Encounter (Signed)
09/12/2014  Patient:            Diana Moreno Date of Birth:  [birth year] MRN:                [id number]   I called the patient at home to discuss scheduling operating room procedure for dental extractions and gross debridement of teeth. Patient agreed to proceed with operating procedure this coming Thursday, 09/15/14 at 7:30 am at [facility]. Due to patient's social and transportation issues,  I'll have cardiology admit the patient for overnight observation with discharge the following morning as indicated. Trish with [facility] =[facility] Care has been contacted and will assist in admission. Presurgical testing was contacted and will arrange appointment as indicated.  Charlynne Pander, DDS

## 2014-09-13 NOTE — Progress Notes (Addendum)
Anesthesia Note:  Patient is a 39 year old female scheduled for multiple teeth extractions on 09/15/14 by Dr. Kristin Bruins. She was referred for dental extraction due to poor dentition with desire to decrease risk of infection with known prosthetic AFBG and AICD.  PAT is scheduled for 09/14/14.   History includes smoking, aortic occlusion s/p AFBG and BLE embolectomies 08/02/12 (hypercoagulable panel was negative) but found to have LV thrombus by 08/07/12 TEE and anticoagulated for ~ 6 months, CAD with RCA occlusion with collaterals 05/2013 (medical RX), cardiomyopathy (mixed), chronic systolic CHF, [employer] single chamber ICD 09/30/13,  PTSD, manic-depressive disorder, anxiety, "benign" adrenal nodule, hiatal hernia, polycystic ovarian disease. She reports she required "alot" of medication for sedation/anesthesia in the past. PCP is listed as Dr. Laren Boom. Cardiologist is Dr. Dietrich Pates who referred patient for this procedure and felt patient was low cardiac risk for this procedure. EP cardiologist is Dr. Sherryl Manges. Vascular surgeon is Dr. Waverly Ferrari.  Meds include Lipitor, Coreg, Peridex, Klonopin, tramadol, Vilazodone.  07/29/14 Echo: - Left ventricle: The cavity size was mildly dilated. Wall thickness was normal. LVEF is approximately 45% with inferior akinesis.  - Mitral valve: There was mild regurgitation. - Left atrium: The atrium was severely dilated. - Tricuspid valve: Mild regurgitation. - Pulmonary arteries: PA peak pressure: 33 mm Hg (S). (Previous EF 25-30% 04/30/13.)  01/04/14 EKG: NSR with sinus arrhythmia, left posterior fascicular block, possible inferior infarct (age undetermined), inferior T wave abnormality, consider ischemia.  06/04/13 LHC: Final Conclusions:  1. No significant coronary obstructive disease in the left main, LAD, or left circumflex (30% OM1). 2. Total occlusion of the RCA with left to right collaterals. 3. Known severe LV  dysfunction. Recommendations: Medical therapy.  01/27/14 CXR: Stable appearing defibrillator lead. No acute abnormality is noted.  PAT is tomorrow.  Dr. Luretha Murphy note indicates that patient will be admitted overnight post-operatively by cardiology.  Velna Ochs [facility]/Anesthesiology Phone [phone] 09/13/2014 5:33 PM  Addendum: Patient called. She lives in a shelter and was unable to get a ride to come to her PAT appointment. Our PAT nurse did a telephone interview and contacted the [employer] rep.  She will get labs on arrival and further evaluation by her anesthesiologist at that time.  Velna Ochs [facility]/Anesthesiology Phone [phone] 09/14/2014 9:15 AM

## 2014-09-14 ENCOUNTER — Inpatient Hospital Stay (HOSPITAL_COMMUNITY)
Admission: RE | Admit: 2014-09-14 | Discharge: 2014-09-14 | Disposition: A | Discharge status: Home / Self Care | Payer: Self-pay | Source: Ambulatory Visit

## 2014-09-14 ENCOUNTER — Encounter (HOSPITAL_COMMUNITY): Payer: Self-pay

## 2014-09-14 MED ORDER — CEFAZOLIN SODIUM-DEXTROSE 2-3 GM-% IV SOLR
2.0000 g | INTRAVENOUS | Status: AC
  Administered 2014-09-15: 2 g via INTRAVENOUS

## 2014-09-14 NOTE — Progress Notes (Addendum)
Patient called in this am stating she was unable to come in for PAT appt.  Will do history over the phone.  I called for [employer] rep.  [phone]  Diana Moreno   [phone] Dr. Luretha Murphy office aware of missed PAT

## 2014-09-15 ENCOUNTER — Encounter (HOSPITAL_COMMUNITY)
Admission: RE | Disposition: A | Discharge status: Home / Self Care | Payer: Self-pay | Source: Ambulatory Visit | Attending: Internal Medicine

## 2014-09-15 ENCOUNTER — Encounter (HOSPITAL_COMMUNITY): Payer: Self-pay | Admitting: *Deleted

## 2014-09-15 ENCOUNTER — Ambulatory Visit (HOSPITAL_COMMUNITY): Payer: Medicaid Other | Admitting: Vascular Surgery

## 2014-09-15 ENCOUNTER — Observation Stay (HOSPITAL_COMMUNITY)
Admission: RE | Admit: 2014-09-15 | Discharge: 2014-09-16 | Disposition: A | Discharge status: Home / Self Care | Payer: Medicaid Other | Source: Ambulatory Visit | Attending: Internal Medicine | Admitting: Internal Medicine

## 2014-09-15 ENCOUNTER — Ambulatory Visit (HOSPITAL_COMMUNITY): Payer: Medicaid Other | Admitting: Certified Registered Nurse Anesthetist

## 2014-09-15 DIAGNOSIS — Z590 Homelessness: Secondary | ICD-10-CM | POA: Insufficient documentation

## 2014-09-15 DIAGNOSIS — K083 Retained dental root: Secondary | ICD-10-CM

## 2014-09-15 DIAGNOSIS — I509 Heart failure, unspecified: Secondary | ICD-10-CM | POA: Diagnosis not present

## 2014-09-15 DIAGNOSIS — K029 Dental caries, unspecified: Secondary | ICD-10-CM | POA: Diagnosis not present

## 2014-09-15 DIAGNOSIS — F17210 Nicotine dependence, cigarettes, uncomplicated: Secondary | ICD-10-CM | POA: Insufficient documentation

## 2014-09-15 DIAGNOSIS — I428 Other cardiomyopathies: Secondary | ICD-10-CM

## 2014-09-15 DIAGNOSIS — I2510 Atherosclerotic heart disease of native coronary artery without angina pectoris: Secondary | ICD-10-CM | POA: Diagnosis not present

## 2014-09-15 DIAGNOSIS — G8929 Other chronic pain: Secondary | ICD-10-CM | POA: Diagnosis not present

## 2014-09-15 DIAGNOSIS — K449 Diaphragmatic hernia without obstruction or gangrene: Secondary | ICD-10-CM | POA: Diagnosis not present

## 2014-09-15 DIAGNOSIS — I429 Cardiomyopathy, unspecified: Secondary | ICD-10-CM | POA: Diagnosis not present

## 2014-09-15 DIAGNOSIS — I739 Peripheral vascular disease, unspecified: Secondary | ICD-10-CM | POA: Diagnosis not present

## 2014-09-15 DIAGNOSIS — Z95810 Presence of automatic (implantable) cardiac defibrillator: Secondary | ICD-10-CM | POA: Insufficient documentation

## 2014-09-15 DIAGNOSIS — I5022 Chronic systolic (congestive) heart failure: Secondary | ICD-10-CM | POA: Insufficient documentation

## 2014-09-15 DIAGNOSIS — K045 Chronic apical periodontitis: Secondary | ICD-10-CM | POA: Diagnosis not present

## 2014-09-15 DIAGNOSIS — K0530 Chronic periodontitis, unspecified: Secondary | ICD-10-CM | POA: Diagnosis present

## 2014-09-15 HISTORY — DX: Other cardiomyopathies: I42.8

## 2014-09-15 HISTORY — PX: MULTIPLE EXTRACTIONS WITH ALVEOLOPLASTY: SHX5342

## 2014-09-15 HISTORY — PX: DENTAL SURGERY: SHX609

## 2014-09-15 LAB — HCG, SERUM, QUALITATIVE: PREG SERUM: NEGATIVE

## 2014-09-15 LAB — BASIC METABOLIC PANEL
Anion gap: 10 (ref 5–15)
BUN: 6 mg/dL (ref 6–23)
CO2: 21 mmol/L (ref 19–32)
CREATININE: 0.97 mg/dL (ref 0.50–1.10)
Calcium: 8.9 mg/dL (ref 8.4–10.5)
Chloride: 109 mmol/L (ref 96–112)
GFR calc Af Amer: 85 mL/min — ABNORMAL LOW (ref >90)
GFR, EST NON AFRICAN AMERICAN: 73 mL/min — AB (ref >90)
Glucose, Bld: 84 mg/dL (ref 70–99)
POTASSIUM: 3.7 mmol/L (ref 3.5–5.1)
Sodium: 140 mmol/L (ref 135–145)

## 2014-09-15 LAB — CBC
HEMATOCRIT: 38.7 % (ref 36.0–46.0)
Hemoglobin: 12.3 g/dL (ref 12.0–15.0)
MCH: 28.4 pg (ref 26.0–34.0)
MCHC: 31.8 g/dL (ref 30.0–36.0)
MCV: 89.4 fL (ref 78.0–100.0)
Platelets: 248 10*3/uL (ref 150–400)
RBC: 4.33 MIL/uL (ref 3.87–5.11)
RDW: 16.2 % — AB (ref 11.5–15.5)
WBC: 12.3 10*3/uL — ABNORMAL HIGH (ref 4.0–10.5)

## 2014-09-15 SURGERY — MULTIPLE EXTRACTION WITH ALVEOLOPLASTY
Anesthesia: General | Site: Mouth

## 2014-09-15 MED ORDER — CEFAZOLIN SODIUM-DEXTROSE 2-3 GM-% IV SOLR
INTRAVENOUS | Status: AC
  Filled 2014-09-15: qty 50

## 2014-09-15 MED ORDER — BUPIVACAINE-EPINEPHRINE 0.5% -1:200000 IJ SOLN
INTRAMUSCULAR | Status: DC | PRN
  Administered 2014-09-15: 1.8 mL

## 2014-09-15 MED ORDER — LACTATED RINGERS IV SOLN
INTRAVENOUS | Status: DC
  Administered 2014-09-15 (×2): via INTRAVENOUS

## 2014-09-15 MED ORDER — PROPOFOL 10 MG/ML IV BOLUS
INTRAVENOUS | Status: AC
  Filled 2014-09-15: qty 20

## 2014-09-15 MED ORDER — BUPIVACAINE-EPINEPHRINE (PF) 0.5% -1:200000 IJ SOLN
INTRAMUSCULAR | Status: AC
  Filled 2014-09-15: qty 3.6

## 2014-09-15 MED ORDER — ACETAMINOPHEN 500 MG PO TABS: 1000.0000 mg | ORAL_TABLET | Freq: Four times a day (QID) | ORAL | Status: DC | PRN

## 2014-09-15 MED ORDER — HEMOSTATIC AGENTS (NO CHARGE) OPTIME
TOPICAL | Status: DC | PRN
  Administered 2014-09-15: 1 via TOPICAL

## 2014-09-15 MED ORDER — LACTATED RINGERS IV SOLN
INTRAVENOUS | Status: DC
  Administered 2014-09-15: 10 mL/h via INTRAVENOUS

## 2014-09-15 MED ORDER — OXYMETAZOLINE HCL 0.05 % NA SOLN
NASAL | Status: AC
  Filled 2014-09-15: qty 15

## 2014-09-15 MED ORDER — TRAMADOL HCL 50 MG PO TABS
50.0000 mg | ORAL_TABLET | Freq: Four times a day (QID) | ORAL | Status: DC | PRN
  Filled 2014-09-15: qty 1

## 2014-09-15 MED ORDER — 0.9 % SODIUM CHLORIDE (POUR BTL) OPTIME
TOPICAL | Status: DC | PRN
  Administered 2014-09-15: 1000 mL

## 2014-09-15 MED ORDER — FENTANYL CITRATE (PF) 100 MCG/2ML IJ SOLN
INTRAMUSCULAR | Status: DC | PRN
  Administered 2014-09-15 (×5): 50 ug via INTRAVENOUS

## 2014-09-15 MED ORDER — LIDOCAINE-EPINEPHRINE 2 %-1:100000 IJ SOLN
INTRAMUSCULAR | Status: DC | PRN
  Administered 2014-09-15 (×3): 1.7 mL

## 2014-09-15 MED ORDER — LIDOCAINE HCL (CARDIAC) 20 MG/ML IV SOLN
INTRAVENOUS | Status: AC
  Filled 2014-09-15: qty 5

## 2014-09-15 MED ORDER — LISINOPRIL 2.5 MG PO TABS
1.2500 mg | ORAL_TABLET | Freq: Every day | ORAL | Status: DC
  Administered 2014-09-16: 1.25 mg via ORAL
  Filled 2014-09-15 (×3): qty 0.5

## 2014-09-15 MED ORDER — OXYCODONE-ACETAMINOPHEN 5-325 MG PO TABS
1.0000 | ORAL_TABLET | ORAL | Status: DC | PRN
Start: 2014-09-15 — End: 2014-09-16
  Administered 2014-09-15 – 2014-09-16 (×6): 2 via ORAL
  Filled 2014-09-15 (×6): qty 2

## 2014-09-15 MED ORDER — ALBUMIN HUMAN 5 % IV SOLN
INTRAVENOUS | Status: DC | PRN
  Administered 2014-09-15: 08:00:00 via INTRAVENOUS

## 2014-09-15 MED ORDER — ROCURONIUM BROMIDE 50 MG/5ML IV SOLN
INTRAVENOUS | Status: AC
  Filled 2014-09-15: qty 1

## 2014-09-15 MED ORDER — LIDOCAINE-EPINEPHRINE 2 %-1:100000 IJ SOLN
INTRAMUSCULAR | Status: AC
  Filled 2014-09-15: qty 10.2

## 2014-09-15 MED ORDER — SUCCINYLCHOLINE CHLORIDE 20 MG/ML IJ SOLN
INTRAMUSCULAR | Status: DC | PRN
  Administered 2014-09-15: 100 mg via INTRAVENOUS

## 2014-09-15 MED ORDER — CARVEDILOL 3.125 MG PO TABS
3.1250 mg | ORAL_TABLET | Freq: Two times a day (BID) | ORAL | Status: DC
  Administered 2014-09-15 – 2014-09-16 (×2): 3.125 mg via ORAL
  Filled 2014-09-15 (×2): qty 1

## 2014-09-15 MED ORDER — ZOLPIDEM TARTRATE 5 MG PO TABS
5.0000 mg | ORAL_TABLET | Freq: Every evening | ORAL | Status: DC | PRN
Start: 2014-09-15 — End: 2014-09-16

## 2014-09-15 MED ORDER — ONDANSETRON HCL 4 MG/2ML IJ SOLN
INTRAMUSCULAR | Status: AC
  Filled 2014-09-15: qty 2

## 2014-09-15 MED ORDER — LIDOCAINE HCL (CARDIAC) 20 MG/ML IV SOLN
INTRAVENOUS | Status: DC | PRN
  Administered 2014-09-15: 40 mg via INTRAVENOUS

## 2014-09-15 MED ORDER — VILAZODONE HCL 40 MG PO TABS
40.0000 mg | ORAL_TABLET | Freq: Every day | ORAL | Status: DC
  Administered 2014-09-15 – 2014-09-16 (×2): 40 mg via ORAL
  Filled 2014-09-15 (×2): qty 1

## 2014-09-15 MED ORDER — FENTANYL CITRATE (PF) 100 MCG/2ML IJ SOLN: 25.0000 ug | INTRAMUSCULAR | Status: DC | PRN

## 2014-09-15 MED ORDER — FENTANYL CITRATE (PF) 250 MCG/5ML IJ SOLN
INTRAMUSCULAR | Status: AC
  Filled 2014-09-15: qty 5

## 2014-09-15 MED ORDER — MIDAZOLAM HCL 5 MG/5ML IJ SOLN
INTRAMUSCULAR | Status: DC | PRN
  Administered 2014-09-15: 2 mg via INTRAVENOUS

## 2014-09-15 MED ORDER — ONDANSETRON HCL 4 MG/2ML IJ SOLN: 4.0000 mg | Freq: Four times a day (QID) | INTRAMUSCULAR | Status: DC | PRN

## 2014-09-15 MED ORDER — MIDAZOLAM HCL 2 MG/2ML IJ SOLN
INTRAMUSCULAR | Status: AC
  Filled 2014-09-15: qty 2

## 2014-09-15 MED ORDER — PROPOFOL 10 MG/ML IV BOLUS
INTRAVENOUS | Status: DC | PRN
  Administered 2014-09-15: 90 mg via INTRAVENOUS

## 2014-09-15 MED ORDER — ATORVASTATIN CALCIUM 40 MG PO TABS
40.0000 mg | ORAL_TABLET | Freq: Every day | ORAL | Status: DC
  Administered 2014-09-15 – 2014-09-16 (×2): 40 mg via ORAL
  Filled 2014-09-15 (×2): qty 1

## 2014-09-15 MED ORDER — CLONAZEPAM 1 MG PO TABS
1.0000 mg | ORAL_TABLET | Freq: Two times a day (BID) | ORAL | Status: DC | PRN
  Administered 2014-09-15 – 2014-09-16 (×2): 1 mg via ORAL
  Filled 2014-09-15 (×2): qty 1

## 2014-09-15 SURGICAL SUPPLY — 32 items
ALCOHOL 70% 16 OZ (MISCELLANEOUS) ×2 IMPLANT
ATTRACTOMAT 16X20 MAGNETIC DRP (DRAPES) ×2 IMPLANT
BLADE SURG 15 STRL LF DISP TIS (BLADE) ×2 IMPLANT
BLADE SURG 15 STRL SS (BLADE) ×2
COVER SURGICAL LIGHT HANDLE (MISCELLANEOUS) ×2 IMPLANT
GAUZE PACKING FOLDED 2  STR (GAUZE/BANDAGES/DRESSINGS) ×1
GAUZE PACKING FOLDED 2 STR (GAUZE/BANDAGES/DRESSINGS) ×1 IMPLANT
GAUZE SPONGE 4X4 16PLY XRAY LF (GAUZE/BANDAGES/DRESSINGS) ×2 IMPLANT
GLOVE BIOGEL PI IND STRL 6 (GLOVE) ×1 IMPLANT
GLOVE BIOGEL PI INDICATOR 6 (GLOVE) ×1
GLOVE SURG ORTHO 8.0 STRL STRW (GLOVE) ×2 IMPLANT
GLOVE SURG SS PI 6.0 STRL IVOR (GLOVE) ×2 IMPLANT
GOWN STRL REUS W/ TWL LRG LVL3 (GOWN DISPOSABLE) ×1 IMPLANT
GOWN STRL REUS W/TWL 2XL LVL3 (GOWN DISPOSABLE) ×2 IMPLANT
GOWN STRL REUS W/TWL LRG LVL3 (GOWN DISPOSABLE) ×1
HEMOSTAT SURGICEL 2X14 (HEMOSTASIS) ×2 IMPLANT
KIT BASIN OR (CUSTOM PROCEDURE TRAY) ×2 IMPLANT
KIT ROOM TURNOVER OR (KITS) ×2 IMPLANT
MANIFOLD NEPTUNE WASTE (CANNULA) ×2 IMPLANT
NEEDLE BLUNT 16X1.5 OR ONLY (NEEDLE) ×2 IMPLANT
NS IRRIG 1000ML POUR BTL (IV SOLUTION) ×2 IMPLANT
PACK EENT II TURBAN DRAPE (CUSTOM PROCEDURE TRAY) ×2 IMPLANT
PAD ARMBOARD 7.5X6 YLW CONV (MISCELLANEOUS) ×2 IMPLANT
SPONGE SURGIFOAM ABS GEL 100 (HEMOSTASIS) IMPLANT
SPONGE SURGIFOAM ABS GEL 12-7 (HEMOSTASIS) IMPLANT
SPONGE SURGIFOAM ABS GEL SZ50 (HEMOSTASIS) ×2 IMPLANT
SUCTION FRAZIER TIP 10 FR DISP (SUCTIONS) IMPLANT
SUT CHROMIC 3 0 PS 2 (SUTURE) ×4 IMPLANT
SYR 50ML SLIP (SYRINGE) ×2 IMPLANT
TOWEL OR 17X26 10 PK STRL BLUE (TOWEL DISPOSABLE) ×2 IMPLANT
TUBE CONNECTING 12X1/4 (SUCTIONS) ×2 IMPLANT
YANKAUER SUCT BULB TIP NO VENT (SUCTIONS) ×2 IMPLANT
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
## 2014-09-15 NOTE — Anesthesia Postprocedure Evaluation (Signed)
  Anesthesia Post-op Note  Patient: Diana Moreno  Procedure(s) Performed: Procedure(s): Extraction of tooth #'s 1,16 with alveoloplasty and gross debridement of remaining teeth (N/A)  Patient Location: PACU  Anesthesia Type:General  Level of Consciousness: awake  Airway and Oxygen Therapy: Patient Spontanous Breathing  Post-op Pain: mild  Post-op Assessment: Post-op Vital signs reviewed  Post-op Vital Signs: Reviewed  Last Vitals:  Filed Vitals:   09/15/14 0900  BP: 144/105  Pulse: 90  Temp: 36.6 C  Resp: 14    Complications: No apparent anesthesia complications

## 2014-09-15 NOTE — Discharge Instructions (Signed)

## 2014-09-15 NOTE — Progress Notes (Signed)
PRE-OPERATIVE NOTE:  09/15/2014   Diana Moreno [id number]  VITALS: BP 124/69 mmHg  Pulse 71  Temp(Src) 98.6 F (37 C) (Oral)  Resp 18  Wt 168 lb (76.204 kg)  SpO2 99%  LMP 08/24/2014 (Exact Date)  Lab Results  Component Value Date   WBC 10.9* 07/29/2014   HGB 12.5 07/29/2014   HCT 38.0 07/29/2014   MCV 86.1 07/29/2014   PLT 249.0 07/29/2014   BMET    Component Value Date/Time   NA 140 07/28/2014 0914   K 4.3 07/28/2014 0914   CL 110 07/28/2014 0914   CO2 26 07/28/2014 0914   GLUCOSE 97 07/28/2014 0914   BUN 7 07/28/2014 0914   CREATININE 0.83 07/28/2014 0914   CALCIUM 9.5 07/28/2014 0914   GFRNONAA >90 08/16/2012 1412   GFRAA >90 08/16/2012 1412    Lab Results  Component Value Date   INR 1.38 09/30/2013   INR 1.5* 09/23/2013   INR 1.2 06/15/2013   No results found for: PTT   Diana Moreno presents for multiple dental extractions with alveoloplasty and gross debridement of remaining dentition in the operative room general anesthesia.    SUBJECTIVE: The patient denies any acute medical or dental changes and agrees to proceed with treatment as planned.  EXAM: No sign of acute dental changes.  ASSESSMENT: Patient is affected by chronic apical periodontitis, retained root segments, dental caries, chronic periodontitis, and accretions.  PLAN: Patient agrees to proceed with treatment as planned in the operating room as previously discussed and accepts the risks, benefits, and complications of the proposed treatment. Patient is aware of the risk for bleeding, bruising, swelling, infection, pain, nerve damage, soft tissue damage, damage to adjacent teeth, sinus involvement, root tip fracture, mandible fracture, and the risks of complications associated with the anesthesia. Patient also is aware of the potential for other complications not mentioned above.   Charlynne Pander, DDS

## 2014-09-15 NOTE — Telephone Encounter (Signed)
Pt had dental surgery this am and according to OP note is being kept overnight.

## 2014-09-15 NOTE — Op Note (Addendum)
OPERATIVE REPORT  Patient:            Diana Moreno Date of Birth:  November 14, 1975 MRN:                [id number]   DATE OF PROCEDURE:  09/15/2014  PREOPERATIVE DIAGNOSES: 1. Cardiomyopathy 2. History of vascular occlusion-status post aorta-bifemoral artery bypass graft  3. Chronic apical periodontitis 4. Retained root segments 5. Dental caries 6. Chronic periodontitis 7. Accretions   POSTOPERATIVE DIAGNOSES: 1. Cardiomyopathy 2. History of vascular occlusion-status post aorta-bifemoral artery bypass graft  3. Chronic apical periodontitis 4. Retained root segments 5. Dental caries 6. Chronic periodontitis 7. Accretions   OPERATIONS: 1. Multiple extraction of tooth numbers 1 and 16 with alveoloplasty 2. Gross debridement of remaining dentition   SURGEON: Charlynne Pander, DDS  ASSISTANT: Rory Percy, (dental assistant)  ANESTHESIA: General anesthesia via oral endotracheal tube.  MEDICATIONS: 1. Ancef 2 g IV prior to invasive dental procedures. 2. Local anesthesia with a total utilization of 3 carpules each containing 34 mg of lidocaine with 0.017 mg of epinephrine as well as 2 carpules each containing 9 mg of bupivacaine with 0.009 mg of epinephrine.  SPECIMENS: There are 2 teeth that were discarded.  DRAINS: None  CULTURES: None  COMPLICATIONS: None   ESTIMATED BLOOD LOSS: 25 mLs.  INTRAVENOUS FLUIDS: 750 mLs of Lactated ringers solution and 250 mL of albumin  INDICATIONS: The patient was previously diagnosed with cardiomyopathy, chronic congestive heart failure, and history of vascular occlusion. Patient is status post aortobifemoral bypass grafting with Dr. Edilia Bo.  A dental consultation was then requested to evaluate poor dentition.  The patient was examined and treatment planned for extraction of tooth numbers 1 and 16 with alveoloplasty and gross treatment remained dentition.  This treatment plan was formulated to decrease the risks and complications  associated with dental infection from affecting the patient's systemic health and to help prevent infection of the recent aorta-bifemoral bypass graft.  OPERATIVE FINDINGS: Patient was examined operating room number 12.  The teeth were identified for extraction. The patient was noted be affected by chronic apical periodontitis, retained root segments, dental caries, chronic periodontitis, and significant accretions.   DESCRIPTION OF PROCEDURE: Patient was brought to the main operating room number 12. Patient was then placed in the supine position on the operating table. General anesthesia was then induced per the anesthesia team. The patient was then prepped and draped in the usual manner for dental medicine procedure. A timeout was performed. The patient was identified and procedures were verified. A throat pack was placed at this time. The oral cavity was then thoroughly examined with the findings noted above. The patient was then ready for dental medicine procedure as follows:  Local anesthesia was then administered sequentially with a total utilization of 3 carpules each containing 34 mg of lidocaine with 0.017 mg of epinephrine as well as 2 carpules  each containing 9 mg bupivacaine with 0.009 mg of epinephrine.  The Maxillary left and right quadrants first approached. Anesthesia was then delivered utilizing infiltration with lidocaine with epinephrine. A #15 blade incision was then made from the maxillary right tuberosity and extended to the mesial #2.  A  surgical flap was then carefully reflected. Appropriate amounts of buccal and interseptal bone were then removed.  Tooth #1 was then subluxated with a series straight elevators and removed utilizing a series of cryers elevators without complication. The socket was curetted and compressed appropriately.  Alveoloplasty was then performed utilizing a rongeur  and bone file. The surgical site was then irrigated with copious amounts sterile saline. A  piece of Surgifoam was then placed in the extraction socket. The surgical site was then closed from the distal of the tuberosity and extended to the distal of #2 utilizing 3-0 chromic gut suture in a continuous interrupted suture technique 1. One individual interrupted sutures placed in approximately between tooth numbers 2 and 3 appropriately.   The maxillary left quadrant was then approached. The retained root in the area of tooth #16 was then elevated out with a straight forceps without complications. The socket was then curetted and compressed appropriately. The socket was then irrigated with copious amounts sterile saline. A piece of Surgifoam was then placed in the extraction socket. The surgical site was then closed utilizing a figure of 8 suture technique and 3-0 chromic gut material.   At this point time the remaining dentition was approached. A sonic scaler was used to remove significant accretions.  A series of hand curettes were then utilized to further remove accretions. A sonic scaler was then again used to further refine removal of accretions. At this point time, the entire mouth was irrigated with copious amounts of sterile saline. The patient was examined for complications, seeing none, the dental medicine procedure was deemed to be complete. The throat pack was removed at this time. The patient was then handed over to the anesthesia team for final disposition. After an appropriate amount of time, the patient was extubated and taken to the postanesthsia care unit in good condition. All counts were correct for the dental medicine procedure.  The patient will be kept overnight for observation by the cardiology team. Patient will be discharged in the morning at the discretion of the cardiology team as patient is cleared for discharge to dentally at this time.   Charlynne Pander, DDS.

## 2014-09-15 NOTE — Transfer of Care (Signed)
Immediate Anesthesia Transfer of Care Note  Patient: Diana Moreno  Procedure(s) Performed: Procedure(s): Extraction of tooth #'s 1,16 with alveoloplasty and gross debridement of remaining teeth (N/A)  Patient Location: PACU  Anesthesia Type:General  Level of Consciousness: awake, alert  and oriented  Airway & Oxygen Therapy: Patient Spontanous Breathing and Patient connected to nasal cannula oxygen  Post-op Assessment: Report given to RN and Post -op Vital signs reviewed and stable  Post vital signs: Reviewed and stable  Last Vitals:  Filed Vitals:   09/15/14 0651  BP: 124/69  Pulse: 71  Temp: 37 C  Resp: 18    Complications: No apparent anesthesia complications

## 2014-09-15 NOTE — Anesthesia Procedure Notes (Signed)
Procedure Name: Intubation Date/Time: 09/15/2014 7:36 AM Performed by: Dairl Ponder Pre-anesthesia Checklist: Patient identified, Timeout performed, Emergency Drugs available, Suction available and Patient being monitored Oxygen Delivery Method: Circle system utilized Preoxygenation: Pre-oxygenation with 100% oxygen Intubation Type: IV induction Laryngoscope Size: Mac and 3 Grade View: Grade I Tube type: Oral Tube size: 7.0 mm Number of attempts: 1 Placement Confirmation: ETT inserted through vocal cords under direct vision,  breath sounds checked- equal and bilateral and positive ETCO2 Secured at: 23 cm Tube secured with: Tape Dental Injury: Teeth and Oropharynx as per pre-operative assessment

## 2014-09-15 NOTE — Anesthesia Preprocedure Evaluation (Addendum)
Anesthesia Evaluation  Patient identified by MRN, date of birth, ID band Patient awake    Reviewed: Allergy & Precautions, NPO status , Patient's Chart, lab work & pertinent test results  Airway Mallampati: II  TM Distance: >3 FB Neck ROM: Full    Dental   Pulmonary Current Smoker,  breath sounds clear to auscultation        Cardiovascular + CAD and + Peripheral Vascular Disease + Cardiac Defibrillator Rhythm:Regular Rate:Normal     Neuro/Psych    GI/Hepatic Neg liver ROS, hiatal hernia,   Endo/Other    Renal/GU negative Renal ROS     Musculoskeletal   Abdominal   Peds  Hematology   Anesthesia Other Findings   Reproductive/Obstetrics                            Anesthesia Physical Anesthesia Plan  ASA: IV  Anesthesia Plan: General   Post-op Pain Management:    Induction: Intravenous  Airway Management Planned: Oral ETT  Additional Equipment:   Intra-op Plan:   Post-operative Plan: Extubation in OR  Informed Consent: I have reviewed the patients History and Physical, chart, labs and discussed the procedure including the risks, benefits and alternatives for the proposed anesthesia with the patient or authorized representative who has indicated his/her understanding and acceptance.   Dental advisory given  Plan Discussed with: CRNA, Anesthesiologist and Surgeon  Anesthesia Plan Comments:        Anesthesia Quick Evaluation
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
## 2014-09-16 ENCOUNTER — Encounter (HOSPITAL_COMMUNITY): Payer: Self-pay | Admitting: Dentistry

## 2014-09-16 DIAGNOSIS — I429 Cardiomyopathy, unspecified: Secondary | ICD-10-CM | POA: Diagnosis not present

## 2014-09-16 MED ORDER — OXYCODONE-ACETAMINOPHEN 5-325 MG PO TABS: 1.0000 | ORAL_TABLET | ORAL | Status: DC | PRN

## 2014-09-16 MED ORDER — LISINOPRIL 2.5 MG PO TABS: 1.2500 mg | ORAL_TABLET | Freq: Every day | ORAL | Status: DC

## 2014-09-16 MED ORDER — WHITE PETROLATUM GEL
Status: AC
  Filled 2014-09-16: qty 1

## 2014-09-16 NOTE — Care Management Note (Signed)
  Page 1 of 1   09/16/2014     10:43:35 AM CARE MANAGEMENT NOTE 09/16/2014  Patient:  Diana Moreno,Diana Moreno   Account Number:  [id number]  Date Initiated:  09/16/2014  Documentation initiated by:  Ronny Flurry  Subjective/Objective Assessment:     Action/Plan:   Anticipated DC Date:  09/16/2014   Anticipated DC Plan:  HOME/SELF CARE  In-house referral  Clinical Social Worker      DC Associate Professor  CM consult  MATCH Program  Medication Assistance      Choice offered to / List presented to:             Status of service:  Completed, signed off Medicare Important Message given?   (If response is "NO", the following Medicare IM given date fields will be blank) Date Medicare IM given:   Medicare IM given by:   Date Additional Medicare IM given:   Additional Medicare IM given by:    Discharge Disposition:  HOME/SELF CARE  Per UR Regulation:    If discussed at Long Length of Stay Meetings, dates discussed:    Comments:  09-16-14 Explained MATCH to patient , she is aware MATCH only covers 14 days worth of pain medication.   Patient requesting transportation home . SW Eric aware.   Ronny Flurry RN BSN [phone]

## 2014-09-16 NOTE — Discharge Summary (Signed)
Physician Discharge Summary  Patient ID: Diana Moreno MRN: [id number] DOB/AGE: 1976-03-23 39 y.o.  Primary Cardiologist: Dr. Tenny Craw  Admit date: 09/15/2014 Discharge date: 09/16/2014  Admission Diagnoses: Dental Caries/ Post-Op Observation  Discharge Diagnoses:  Principal Problem:   Cardiomyopathy, nonischemic Active Problems:   Dental caries   NICM (nonischemic cardiomyopathy)   Discharged Condition: stable  Hospital Course: Diana Moreno is a 39 y.o. female with a history of NICM, Aortic occlusion, LV thrombus, S-CHF. She has an ICD in place. She was seen by Dr Tenny Craw prior to her planned dental procedure, and was considered at acceptable risk for the procedure. She came to [facility] on 09/15/14 and had dental extraction x 2 with alveoplasty and gross debridement of remaining teeth. Cardiology was asked to admit her for overnight observation after the procedure. She was monitered on telemetry and had no arrhthymias. Vital signs and volume status remained stable. No CP or dyspnea. Her medications were continued. No immediate post-operative complications. She was last seen and examined by Dr. Excell Seltzer who determined she was stable for discharge home. She will f/u with Dr. Kristin Bruins and will continue routine f/u with Dr. Tenny Craw.   Consults: None  Significant Diagnostic Studies:   Treatments:  OPERATIONS: 1. Multiple extraction of tooth numbers 1 and 16 with alveoloplasty 2. Gross debridement of remaining dentition  Discharge Exam: Blood pressure 121/77, pulse 66, temperature 98.2 F (36.8 C), temperature source Oral, resp. rate 19, weight 168 lb (76.204 kg), last menstrual period 08/24/2014, SpO2 96 %.  Pt is alert and oriented, NAD HEENT: normal, poor dentition Neck: JVP - normal, carotids 2+= without bruits Lungs: CTA bilaterally CV: RRR with grade 2/6 ejection murmur at the right upper sternal border Abd: soft, NT, Positive BS, no hepatomegaly Ext: no C/C/E Skin: warm/dry no  rash  Disposition: 01-Home or Self Care      Discharge Instructions    Diet - low sodium heart healthy    Complete by:  As directed      Gauze    Complete by:  As directed   4 x 4 gauze to oral bleeding sites until oozing stops.     Increase activity slowly    Complete by:  As directed             Medication List    TAKE these medications        acetaminophen 500 MG tablet  Commonly known as:  TYLENOL  Take 1,000 mg by mouth every 6 (six) hours as needed for pain or fever.     atorvastatin 40 MG tablet  Commonly known as:  LIPITOR  Take 1 tablet (40 mg total) by mouth daily at 6 PM.     carvedilol 3.125 MG tablet  Commonly known as:  COREG  Take 1 tablet (3.125 mg total) by mouth 2 (two) times daily with a meal.     chlorhexidine 0.12 % solution  Commonly known as:  PERIDEX  Rinse with 15 mls twice daily for 30 seconds. Use after breakfast and at bedtime. Spit out excess. Do not swallow.     clonazePAM 1 MG tablet  Commonly known as:  KLONOPIN  Take 1 tablet (1 mg total) by mouth 2 (two) times daily as needed for anxiety.     lisinopril 2.5 MG tablet  Commonly known as:  PRINIVIL,ZESTRIL  Take 0.5 tablets (1.25 mg total) by mouth at bedtime.     oxyCODONE-acetaminophen 5-325 MG per tablet  Commonly known as:  PERCOCET/ROXICET  Take 1-2  tablets by mouth every 4 (four) hours as needed for moderate pain.     traMADol 50 MG tablet  Commonly known as:  ULTRAM  Take 50 mg by mouth every 6 (six) hours as needed for moderate pain.     Vilazodone HCl 40 MG Tabs  Commonly known as:  VIIBRYD  Take 1 tablet (40 mg total) by mouth daily.       Follow-up Information    Follow up with Charlynne Pander, DDS On 09/27/2014.   Specialty:  Dentistry   Why:  For suture removal, For wound re-check   Contact information:   [address] [place] [address] [phone]      TIME SPENT ON DISCHARGE INCLUDING PHYSICIAN TIME> 30 MINUTES   Signed: Robbie Lis 09/16/2014, 9:20 AM  Patient seen, examined. Available data reviewed. Agree with findings, assessment, and plan as outlined by Robbie Lis, PA-C. The patient is independently interviewed and examined. She is having no chest pain or shortness of breath. She underwent dental work yesterday and seems to have recovered well. She is stable for discharge. Her physical exam is documented above. She will keep her scheduled cardiology follow-up. No medication changes were made today.  Tonny Bollman, M.D. 09/16/2014 9:27 AM

## 2014-09-16 NOTE — Progress Notes (Signed)
UR completed 

## 2014-09-16 NOTE — Progress Notes (Signed)
AVS discharge instructions were reviewed with patient. Patient reminded that she had a lisinopril to pick up at her pharmacy. Patient stated that she did not have any questions. Will assist pt to transportation.

## 2014-09-20 ENCOUNTER — Telehealth: Payer: Self-pay | Admitting: Internal Medicine

## 2014-09-20 NOTE — Telephone Encounter (Signed)
New message      Need Dr Tenny Craw to write another letter for disability-----talk to [facility] to explain

## 2014-09-20 NOTE — Telephone Encounter (Signed)
Patient has been living in a shelter for past 2 weeks. She has not been approved for disability and has been trying to get it for over 2 years.     Now, her approval for medicaid is on hold due to her disability denial.  Amy, paralegal with [employer] in [place] has asked her for updated letters from her physicians, especially since her living circumstances have changed.  The fax number [phone], (their phone # is [phone])  She is aware that I will inform Dr. Tenny Craw.  Pt has also contacted vascular and neuro doctors to request letters.

## 2014-09-21 ENCOUNTER — Telehealth: Payer: Self-pay

## 2014-09-21 DIAGNOSIS — I749 Embolism and thrombosis of unspecified artery: Secondary | ICD-10-CM

## 2014-09-21 DIAGNOSIS — Z95828 Presence of other vascular implants and grafts: Secondary | ICD-10-CM

## 2014-09-21 NOTE — Telephone Encounter (Signed)
rec'd phone call from pt.  Requesting a letter from Dr. Edilia Bo that supports her medical disabilities, to assist in expediting getting her Disability expedited.  Reported she is homeless and resides in a shelter at this time because she can't work.  Questioned if she is experiencing new symptoms, since last evaluated in January 2016, per Dr. Edilia Bo.  Reported "my hands tingle at times;  I'm in a lot of pain in my lower back; my feet and legs hurt; my feet get cold; the soles of my feet burn, and I can't stand for longer than 30 minutes at a time, due to the discomfort."  Reported she was advised to have her specialists write letters, to document her medical issues that impact her inability to be employed.  Discussed with Dr. Edilia Bo.  Rec'd v.o. to schedule appt. for ABI's, and office appt. for reevaluation.  Will contact pt. to schedule appt.

## 2014-09-22 NOTE — Telephone Encounter (Signed)
LM for pt re appt, dpm °

## 2014-09-26 ENCOUNTER — Encounter: Payer: Self-pay | Admitting: Internal Medicine

## 2014-10-10 ENCOUNTER — Encounter: Payer: Self-pay | Admitting: Vascular Surgery

## 2014-10-11 ENCOUNTER — Telehealth: Payer: Self-pay | Admitting: Internal Medicine

## 2014-10-11 ENCOUNTER — Encounter: Payer: Self-pay | Admitting: Vascular Surgery

## 2014-10-11 NOTE — Telephone Encounter (Signed)
New problem   Need to speak to nurse concerning ICD-9 codes that is need for pt to start cardiac rehab. Please advise

## 2014-10-11 NOTE — Telephone Encounter (Signed)
I left a message for Diana Moreno to call back at the # provided. There was nothing to identify this was Cardiac Rehab, so I am uncertain if this is the correct #- will await to see if we receive a call back.

## 2014-10-12 ENCOUNTER — Encounter: Payer: Self-pay | Admitting: Internal Medicine

## 2014-10-12 ENCOUNTER — Ambulatory Visit (INDEPENDENT_AMBULATORY_CARE_PROVIDER_SITE_OTHER): Payer: Medicaid Other | Admitting: Internal Medicine

## 2014-10-12 ENCOUNTER — Ambulatory Visit (HOSPITAL_COMMUNITY): Payer: Self-pay | Admitting: Dentistry

## 2014-10-12 ENCOUNTER — Encounter: Payer: Self-pay | Admitting: Vascular Surgery

## 2014-10-12 ENCOUNTER — Ambulatory Visit (HOSPITAL_COMMUNITY)
Admission: RE | Admit: 2014-10-12 | Discharge: 2014-10-12 | Disposition: A | Discharge status: Home / Self Care | Payer: Medicaid Other | Source: Ambulatory Visit | Attending: Vascular Surgery | Admitting: Vascular Surgery

## 2014-10-12 ENCOUNTER — Ambulatory Visit (INDEPENDENT_AMBULATORY_CARE_PROVIDER_SITE_OTHER): Payer: Medicaid Other | Admitting: Vascular Surgery

## 2014-10-12 ENCOUNTER — Telehealth (HOSPITAL_COMMUNITY): Payer: Self-pay

## 2014-10-12 VITALS — BP 128/81 | HR 79 | Ht 65.0 in | Wt 173.4 lb

## 2014-10-12 VITALS — BP 120/70 | HR 75 | Ht 65.0 in | Wt 174.6 lb

## 2014-10-12 DIAGNOSIS — I429 Cardiomyopathy, unspecified: Secondary | ICD-10-CM

## 2014-10-12 DIAGNOSIS — I749 Embolism and thrombosis of unspecified artery: Secondary | ICD-10-CM

## 2014-10-12 DIAGNOSIS — I743 Embolism and thrombosis of arteries of the lower extremities: Secondary | ICD-10-CM | POA: Diagnosis present

## 2014-10-12 DIAGNOSIS — Z4509 Encounter for adjustment and management of other cardiac device: Secondary | ICD-10-CM | POA: Diagnosis not present

## 2014-10-12 DIAGNOSIS — Z95828 Presence of other vascular implants and grafts: Secondary | ICD-10-CM

## 2014-10-12 DIAGNOSIS — Z48812 Encounter for surgical aftercare following surgery on the circulatory system: Secondary | ICD-10-CM | POA: Diagnosis not present

## 2014-10-12 DIAGNOSIS — Z9889 Other specified postprocedural states: Secondary | ICD-10-CM

## 2014-10-12 LAB — CUP PACEART INCLINIC DEVICE CHECK
Date Time Interrogation Session: 20160525040000
HighPow Impedance: 79 Ohm
Lead Channel Impedance Value: 1213 Ohm
Lead Channel Pacing Threshold Amplitude: 2.4 V
Lead Channel Pacing Threshold Pulse Width: 1.5 ms
Lead Channel Sensing Intrinsic Amplitude: 20.7 mV
Lead Channel Setting Pacing Amplitude: 4 V
Lead Channel Setting Sensing Sensitivity: 0.6 mV
MDC IDC SET LEADCHNL RV PACING PULSEWIDTH: 0.8 ms
Pulse Gen Serial Number: 115386
Zone Setting Detection Interval: 273 ms
Zone Setting Detection Interval: 353 ms

## 2014-10-12 MED ORDER — PREGABALIN 75 MG PO CAPS: 75.0000 mg | ORAL_CAPSULE | Freq: Two times a day (BID) | ORAL | Status: DC

## 2014-10-12 NOTE — Addendum Note (Signed)
Addended by: Sharee Pimple on: 10/12/2014 03:23 PM   Modules accepted: Orders
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
## 2014-10-12 NOTE — Telephone Encounter (Signed)
10/12/14          Broken appt.  for F/U w/Dr. Kristin Bruins on 10/12/14 @ 1:30.  Patient called stating due to transportation issues she is unable to keep appt. today.  Patient to call back later to reschedule.  LRI

## 2014-10-12 NOTE — Patient Instructions (Addendum)
Medication Instructions:  Your physician recommends that you continue on your current medications as directed. Please refer to the Current Medication list given to you today. Handwritten prescriptions given to you today for Atorvastatin and Lisinopril for 12 refills.  Labwork: None ordered  Testing/Procedures: None ordered  Follow-Up: Your physician recommends that you schedule a follow-up appointment in: 3 months with device clinic.  Your physician wants you to follow-up in: 1 year with Dr. Graciela Husbands.  You will receive a reminder letter in the mail two months in advance. If you don't receive a letter, please call our office to schedule the follow-up appointment.  Thank you for choosing [facility]!!

## 2014-10-14 NOTE — Telephone Encounter (Signed)
Lm for pt to call back and also Jasmine at [facility] Cardiac Rehab.

## 2014-10-19 ENCOUNTER — Encounter: Payer: Self-pay | Admitting: Internal Medicine

## 2014-10-19 NOTE — Telephone Encounter (Signed)
I called [facility] this morning and spoke with Cardiac Rehab- clarified that the wrong contact # was taken on the initial message. The correct # is [phone]. I called this number and left a voice message for Leavy Cella (on identified voice mail) to please call our office back.

## 2014-10-28 ENCOUNTER — Telehealth: Payer: Self-pay | Admitting: Vascular Surgery

## 2014-10-28 NOTE — Telephone Encounter (Signed)
Received 6 pages of paperwork form [facility] for assistance with Lyrica. The Rx would be at no cost to her through [employer]. The Rx would have to be for 90 days instead of 60 in order to qualify for this program. Paperwork was completed and faxed to [phone].

## 2014-11-14 DIAGNOSIS — Z0279 Encounter for issue of other medical certificate: Secondary | ICD-10-CM

## 2015-01-09 ENCOUNTER — Telehealth: Payer: Self-pay | Admitting: Internal Medicine

## 2015-01-09 NOTE — Telephone Encounter (Signed)
Pt now has home monitor for ICD and will send transmission 01/12/15 rather than an in-clinic check.

## 2015-01-09 NOTE — Telephone Encounter (Signed)
New Message  Pt req a call back to determine if the appt on 08/25 with the Defib Check is needed. Please call.

## 2015-01-12 ENCOUNTER — Ambulatory Visit (INDEPENDENT_AMBULATORY_CARE_PROVIDER_SITE_OTHER): Payer: Medicaid Other | Admitting: *Deleted

## 2015-01-12 ENCOUNTER — Encounter: Payer: Self-pay | Admitting: Internal Medicine

## 2015-01-12 DIAGNOSIS — Z95810 Presence of automatic (implantable) cardiac defibrillator: Secondary | ICD-10-CM

## 2015-01-12 DIAGNOSIS — I5022 Chronic systolic (congestive) heart failure: Secondary | ICD-10-CM | POA: Diagnosis not present

## 2015-01-12 DIAGNOSIS — I429 Cardiomyopathy, unspecified: Secondary | ICD-10-CM | POA: Diagnosis not present

## 2015-01-12 DIAGNOSIS — I428 Other cardiomyopathies: Secondary | ICD-10-CM

## 2015-01-16 NOTE — Progress Notes (Signed)
ICD remote received 

## 2015-01-18 NOTE — Progress Notes (Signed)
Rec'd request from pt's pharmacy for obtaining Prior Auth. for Lyrica 60 mg BID.  Call placed to Medicaid Help Desk; was advised that pt. Would need to have tried and failed two other drug therapies for Neuropathy before Lyrica could be authorized.  Pt. reported having taken Gabapentin 800 mg TID, in past, and eventually it was no longer effective.  Discussed with Dr. Edilia Bo.  Advised that pt. should contact her PCP for further medication management/treatment of her Neuropathy.  Pt. notified, and agrees with plan.  Also notified [employer] in [facility] that the Prior Auth. Of Lyrica was denied, and pt. Has been referred to PCP for further treatment of Neuropathy.  Verb. Understanding.

## 2015-01-19 LAB — CUP PACEART REMOTE DEVICE CHECK
Battery Remaining Percentage: 100 %
Brady Statistic RV Percent Paced: 0 %
Date Time Interrogation Session: 20160826033700
HighPow Impedance: 91 Ohm
Lead Channel Sensing Intrinsic Amplitude: 20.1 mV
Lead Channel Setting Pacing Amplitude: 4 V
MDC IDC MSMT BATTERY REMAINING LONGEVITY: 144 mo
MDC IDC MSMT LEADCHNL RV IMPEDANCE VALUE: 1239 Ohm
MDC IDC SET LEADCHNL RV PACING PULSEWIDTH: 0.8 ms
MDC IDC SET LEADCHNL RV SENSING SENSITIVITY: 0.6 mV
MDC IDC SET ZONE DETECTION INTERVAL: 273 ms
Pulse Gen Serial Number: 115386
Zone Setting Detection Interval: 353 ms

## 2015-01-27 ENCOUNTER — Encounter: Payer: Self-pay | Admitting: *Deleted

## 2015-04-06 ENCOUNTER — Ambulatory Visit: Payer: Medicaid Other | Admitting: Internal Medicine

## 2015-04-06 DIAGNOSIS — R0989 Other specified symptoms and signs involving the circulatory and respiratory systems: Secondary | ICD-10-CM

## 2015-04-07 ENCOUNTER — Encounter: Payer: Self-pay | Admitting: Internal Medicine

## 2015-04-17 ENCOUNTER — Ambulatory Visit (INDEPENDENT_AMBULATORY_CARE_PROVIDER_SITE_OTHER): Payer: Medicaid Other | Admitting: *Deleted

## 2015-04-17 DIAGNOSIS — I429 Cardiomyopathy, unspecified: Secondary | ICD-10-CM | POA: Diagnosis not present

## 2015-04-17 DIAGNOSIS — I428 Other cardiomyopathies: Secondary | ICD-10-CM

## 2015-04-18 NOTE — Progress Notes (Signed)
Remote ICD transmission.   

## 2015-04-19 ENCOUNTER — Ambulatory Visit: Payer: Self-pay | Admitting: Vascular Surgery

## 2015-04-19 ENCOUNTER — Encounter (HOSPITAL_COMMUNITY): Payer: Self-pay

## 2015-04-27 LAB — CUP PACEART REMOTE DEVICE CHECK
Battery Remaining Longevity: 138 mo
Battery Remaining Percentage: 100 %
Brady Statistic RV Percent Paced: 0 %
Date Time Interrogation Session: 20161128092100
HighPow Impedance: 89 Ohm
Implantable Lead Implant Date: 20150514
Implantable Lead Location: 753860
Implantable Lead Model: 292
Implantable Lead Serial Number: 341088
Lead Channel Impedance Value: 1262 Ohm
Lead Channel Pacing Threshold Amplitude: 2.4 V
Lead Channel Setting Sensing Sensitivity: 0.6 mV
MDC IDC MSMT LEADCHNL RV PACING THRESHOLD PULSEWIDTH: 1.5 ms
MDC IDC SET LEADCHNL RV PACING AMPLITUDE: 4 V
MDC IDC SET LEADCHNL RV PACING PULSEWIDTH: 0.8 ms
Pulse Gen Serial Number: 115386

## 2015-04-28 ENCOUNTER — Encounter: Payer: Self-pay | Admitting: Cardiology

## 2015-05-12 ENCOUNTER — Encounter: Payer: Self-pay | Admitting: Cardiology

## 2015-06-02 ENCOUNTER — Ambulatory Visit (INDEPENDENT_AMBULATORY_CARE_PROVIDER_SITE_OTHER): Payer: Medicaid Other | Admitting: Internal Medicine

## 2015-06-02 ENCOUNTER — Encounter: Payer: Self-pay | Admitting: Internal Medicine

## 2015-06-02 VITALS — BP 136/84 | HR 90 | Ht 65.0 in | Wt 175.2 lb

## 2015-06-02 DIAGNOSIS — I428 Other cardiomyopathies: Secondary | ICD-10-CM

## 2015-06-02 DIAGNOSIS — I420 Dilated cardiomyopathy: Secondary | ICD-10-CM | POA: Diagnosis not present

## 2015-06-02 DIAGNOSIS — I429 Cardiomyopathy, unspecified: Secondary | ICD-10-CM | POA: Diagnosis not present

## 2015-06-02 LAB — BASIC METABOLIC PANEL
BUN: 7 mg/dL (ref 7–25)
CO2: 22 mmol/L (ref 20–31)
CREATININE: 0.69 mg/dL (ref 0.50–1.10)
Calcium: 9.6 mg/dL (ref 8.6–10.2)
Chloride: 103 mmol/L (ref 98–110)
Glucose, Bld: 85 mg/dL (ref 65–99)
Potassium: 3.7 mmol/L (ref 3.5–5.3)
Sodium: 137 mmol/L (ref 135–146)

## 2015-06-02 MED ORDER — ATORVASTATIN CALCIUM 40 MG PO TABS: 40.0000 mg | ORAL_TABLET | Freq: Every day | ORAL | Status: DC

## 2015-06-02 MED ORDER — CARVEDILOL 3.125 MG PO TABS: 3.1250 mg | ORAL_TABLET | Freq: Two times a day (BID) | ORAL | Status: DC

## 2015-06-02 MED ORDER — LISINOPRIL 2.5 MG PO TABS: 2.5000 mg | ORAL_TABLET | Freq: Every day | ORAL | Status: DC

## 2015-06-02 NOTE — Patient Instructions (Addendum)
Your physician recommends that you continue on your current medications as directed. Please refer to the Current Medication list given to you today. Your physician recommends that you return for lab work today.  Your physician wants you to follow-up in: 9 months with Dr. Tenny Craw.  You will receive a reminder letter in the mail two months in advance. If you don't receive a letter, please call our office to schedule the follow-up appointment.
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
## 2015-06-06 ENCOUNTER — Encounter: Payer: Self-pay | Admitting: Vascular Surgery

## 2015-06-14 ENCOUNTER — Ambulatory Visit: Payer: Self-pay | Admitting: Vascular Surgery

## 2015-06-14 ENCOUNTER — Encounter (HOSPITAL_COMMUNITY): Payer: Self-pay

## 2015-06-16 ENCOUNTER — Emergency Department (HOSPITAL_COMMUNITY): Payer: Medicaid Other

## 2015-06-16 ENCOUNTER — Encounter (HOSPITAL_COMMUNITY): Payer: Self-pay | Admitting: Emergency Medicine

## 2015-06-16 ENCOUNTER — Inpatient Hospital Stay (HOSPITAL_COMMUNITY)
Admission: EM | Admit: 2015-06-16 | Discharge: 2015-06-19 | DRG: 392 | Disposition: A | Discharge status: Home / Self Care | Payer: Medicaid Other | Attending: Internal Medicine | Admitting: Internal Medicine

## 2015-06-16 DIAGNOSIS — Z95810 Presence of automatic (implantable) cardiac defibrillator: Secondary | ICD-10-CM | POA: Diagnosis present

## 2015-06-16 DIAGNOSIS — B349 Viral infection, unspecified: Secondary | ICD-10-CM

## 2015-06-16 DIAGNOSIS — R112 Nausea with vomiting, unspecified: Secondary | ICD-10-CM | POA: Diagnosis present

## 2015-06-16 DIAGNOSIS — E860 Dehydration: Secondary | ICD-10-CM | POA: Diagnosis present

## 2015-06-16 DIAGNOSIS — I5022 Chronic systolic (congestive) heart failure: Secondary | ICD-10-CM

## 2015-06-16 DIAGNOSIS — I5020 Unspecified systolic (congestive) heart failure: Secondary | ICD-10-CM | POA: Diagnosis present

## 2015-06-16 DIAGNOSIS — I739 Peripheral vascular disease, unspecified: Secondary | ICD-10-CM | POA: Diagnosis not present

## 2015-06-16 DIAGNOSIS — F17210 Nicotine dependence, cigarettes, uncomplicated: Secondary | ICD-10-CM | POA: Diagnosis present

## 2015-06-16 DIAGNOSIS — G8929 Other chronic pain: Secondary | ICD-10-CM | POA: Diagnosis present

## 2015-06-16 DIAGNOSIS — E876 Hypokalemia: Secondary | ICD-10-CM | POA: Diagnosis present

## 2015-06-16 DIAGNOSIS — F319 Bipolar disorder, unspecified: Secondary | ICD-10-CM | POA: Diagnosis present

## 2015-06-16 DIAGNOSIS — R1110 Vomiting, unspecified: Secondary | ICD-10-CM | POA: Diagnosis not present

## 2015-06-16 DIAGNOSIS — I110 Hypertensive heart disease with heart failure: Secondary | ICD-10-CM | POA: Diagnosis present

## 2015-06-16 DIAGNOSIS — M549 Dorsalgia, unspecified: Secondary | ICD-10-CM | POA: Diagnosis present

## 2015-06-16 DIAGNOSIS — I2510 Atherosclerotic heart disease of native coronary artery without angina pectoris: Secondary | ICD-10-CM | POA: Diagnosis present

## 2015-06-16 DIAGNOSIS — F419 Anxiety disorder, unspecified: Secondary | ICD-10-CM | POA: Diagnosis present

## 2015-06-16 DIAGNOSIS — R1013 Epigastric pain: Secondary | ICD-10-CM | POA: Diagnosis present

## 2015-06-16 DIAGNOSIS — I429 Cardiomyopathy, unspecified: Secondary | ICD-10-CM | POA: Diagnosis present

## 2015-06-16 DIAGNOSIS — F4310 Post-traumatic stress disorder, unspecified: Secondary | ICD-10-CM | POA: Diagnosis present

## 2015-06-16 DIAGNOSIS — Z95828 Presence of other vascular implants and grafts: Secondary | ICD-10-CM

## 2015-06-16 DIAGNOSIS — Z720 Tobacco use: Secondary | ICD-10-CM | POA: Diagnosis present

## 2015-06-16 DIAGNOSIS — I428 Other cardiomyopathies: Secondary | ICD-10-CM

## 2015-06-16 HISTORY — DX: Chronic systolic (congestive) heart failure: I50.22

## 2015-06-16 LAB — COMPREHENSIVE METABOLIC PANEL
ALK PHOS: 98 U/L (ref 38–126)
ALT: 21 U/L (ref 14–54)
ANION GAP: 16 — AB (ref 5–15)
AST: 22 U/L (ref 15–41)
Albumin: 4.2 g/dL (ref 3.5–5.0)
BUN: 6 mg/dL (ref 6–20)
CALCIUM: 9.8 mg/dL (ref 8.9–10.3)
CO2: 26 mmol/L (ref 22–32)
CREATININE: 0.93 mg/dL (ref 0.44–1.00)
Chloride: 95 mmol/L — ABNORMAL LOW (ref 101–111)
GFR calc non Af Amer: >60 mL/min (ref >60)
GLUCOSE: 139 mg/dL — AB (ref 65–99)
POTASSIUM: 3.2 mmol/L — AB (ref 3.5–5.1)
SODIUM: 137 mmol/L (ref 135–145)
Total Bilirubin: 0.9 mg/dL (ref 0.3–1.2)
Total Protein: 7 g/dL (ref 6.5–8.1)

## 2015-06-16 LAB — CBC
HCT: 45.6 % (ref 36.0–46.0)
Hemoglobin: 15.5 g/dL — ABNORMAL HIGH (ref 12.0–15.0)
MCH: 30.2 pg (ref 26.0–34.0)
MCHC: 34 g/dL (ref 30.0–36.0)
MCV: 88.7 fL (ref 78.0–100.0)
PLATELETS: 291 10*3/uL (ref 150–400)
RBC: 5.14 MIL/uL — AB (ref 3.87–5.11)
RDW: 15 % (ref 11.5–15.5)
WBC: 23.4 10*3/uL — ABNORMAL HIGH (ref 4.0–10.5)

## 2015-06-16 LAB — URINALYSIS, ROUTINE W REFLEX MICROSCOPIC
Glucose, UA: NEGATIVE mg/dL
HGB URINE DIPSTICK: NEGATIVE
KETONES UR: 40 mg/dL — AB
Leukocytes, UA: NEGATIVE
Nitrite: NEGATIVE
PROTEIN: 100 mg/dL — AB
Specific Gravity, Urine: 1.025 (ref 1.005–1.030)
pH: 7.5 (ref 5.0–8.0)

## 2015-06-16 LAB — LIPASE, BLOOD: Lipase: 21 U/L (ref 11–51)

## 2015-06-16 LAB — INFLUENZA PANEL BY PCR (TYPE A & B)
H1N1 flu by pcr: NOT DETECTED
INFLAPCR: NEGATIVE
INFLBPCR: NEGATIVE

## 2015-06-16 LAB — URINE MICROSCOPIC-ADD ON

## 2015-06-16 LAB — I-STAT TROPONIN, ED: Troponin i, poc: 0 ng/mL (ref 0.00–0.08)

## 2015-06-16 LAB — MAGNESIUM: MAGNESIUM: 1.9 mg/dL (ref 1.7–2.4)

## 2015-06-16 LAB — POC URINE PREG, ED: PREG TEST UR: NEGATIVE

## 2015-06-16 MED ORDER — PANTOPRAZOLE SODIUM 40 MG IV SOLR
40.0000 mg | Freq: Once | INTRAVENOUS | Status: AC
  Administered 2015-06-16: 40 mg via INTRAVENOUS
  Filled 2015-06-16: qty 40

## 2015-06-16 MED ORDER — ONDANSETRON HCL 4 MG/2ML IJ SOLN
4.0000 mg | Freq: Four times a day (QID) | INTRAMUSCULAR | Status: DC | PRN
  Administered 2015-06-16 – 2015-06-17 (×2): 4 mg via INTRAVENOUS
  Filled 2015-06-16 (×3): qty 2

## 2015-06-16 MED ORDER — DIPHENHYDRAMINE HCL 50 MG/ML IJ SOLN
25.0000 mg | Freq: Once | INTRAMUSCULAR | Status: AC
  Administered 2015-06-16: 25 mg via INTRAVENOUS
  Filled 2015-06-16: qty 1

## 2015-06-16 MED ORDER — ENOXAPARIN SODIUM 40 MG/0.4ML ~~LOC~~ SOLN: 40.0000 mg | SUBCUTANEOUS | Status: DC

## 2015-06-16 MED ORDER — SODIUM CHLORIDE 0.9 % IV SOLN
INTRAVENOUS | Status: AC
  Administered 2015-06-17: 03:00:00 via INTRAVENOUS

## 2015-06-16 MED ORDER — SODIUM CHLORIDE 0.9 % IV BOLUS (SEPSIS)
500.0000 mL | Freq: Once | INTRAVENOUS | Status: AC
  Administered 2015-06-16: 500 mL via INTRAVENOUS

## 2015-06-16 MED ORDER — PANTOPRAZOLE SODIUM 40 MG IV SOLR
40.0000 mg | Freq: Two times a day (BID) | INTRAVENOUS | Status: DC
  Administered 2015-06-16 – 2015-06-18 (×4): 40 mg via INTRAVENOUS
  Filled 2015-06-16 (×4): qty 40

## 2015-06-16 MED ORDER — SODIUM CHLORIDE 0.9 % IV SOLN
Freq: Once | INTRAVENOUS | Status: AC
  Administered 2015-06-16: 13:00:00 via INTRAVENOUS

## 2015-06-16 MED ORDER — HYDROMORPHONE HCL 1 MG/ML IJ SOLN
0.5000 mg | INTRAMUSCULAR | Status: DC | PRN
Start: 2015-06-16 — End: 2015-06-19
  Administered 2015-06-18 – 2015-06-19 (×4): 0.5 mg via INTRAVENOUS
  Filled 2015-06-16 (×4): qty 1

## 2015-06-16 MED ORDER — CARVEDILOL 3.125 MG PO TABS
3.1250 mg | ORAL_TABLET | Freq: Two times a day (BID) | ORAL | Status: DC
  Administered 2015-06-17 – 2015-06-19 (×5): 3.125 mg via ORAL
  Filled 2015-06-16 (×6): qty 1

## 2015-06-16 MED ORDER — ONDANSETRON HCL 4 MG PO TABS
4.0000 mg | ORAL_TABLET | Freq: Four times a day (QID) | ORAL | Status: DC | PRN
  Administered 2015-06-17 – 2015-06-18 (×3): 4 mg via ORAL
  Filled 2015-06-16 (×3): qty 1

## 2015-06-16 MED ORDER — PROMETHAZINE HCL 25 MG/ML IJ SOLN
12.5000 mg | Freq: Once | INTRAMUSCULAR | Status: AC
  Administered 2015-06-16: 12.5 mg via INTRAVENOUS
  Filled 2015-06-16: qty 1

## 2015-06-16 MED ORDER — METOCLOPRAMIDE HCL 5 MG/ML IJ SOLN
10.0000 mg | Freq: Once | INTRAMUSCULAR | Status: AC
  Administered 2015-06-16: 10 mg via INTRAVENOUS
  Filled 2015-06-16: qty 2

## 2015-06-16 MED ORDER — SODIUM CHLORIDE 0.9% FLUSH
3.0000 mL | Freq: Two times a day (BID) | INTRAVENOUS | Status: DC
  Administered 2015-06-16 – 2015-06-18 (×4): 3 mL via INTRAVENOUS

## 2015-06-16 MED ORDER — POTASSIUM CHLORIDE 10 MEQ/100ML IV SOLN
10.0000 meq | INTRAVENOUS | Status: AC
  Administered 2015-06-16 (×2): 10 meq via INTRAVENOUS
  Filled 2015-06-16 (×2): qty 100

## 2015-06-16 MED ORDER — SERTRALINE HCL 50 MG PO TABS
50.0000 mg | ORAL_TABLET | Freq: Every day | ORAL | Status: DC
  Administered 2015-06-17 – 2015-06-19 (×3): 50 mg via ORAL
  Filled 2015-06-16 (×3): qty 1

## 2015-06-16 MED ORDER — IOHEXOL 300 MG/ML  SOLN
100.0000 mL | Freq: Once | INTRAMUSCULAR | Status: AC | PRN
  Administered 2015-06-16: 100 mL via INTRAVENOUS

## 2015-06-16 MED ORDER — CLONAZEPAM 0.5 MG PO TABS
1.0000 mg | ORAL_TABLET | Freq: Two times a day (BID) | ORAL | Status: DC
  Administered 2015-06-16 – 2015-06-19 (×6): 1 mg via ORAL
  Filled 2015-06-16 (×6): qty 2

## 2015-06-16 MED ORDER — ATORVASTATIN CALCIUM 40 MG PO TABS
40.0000 mg | ORAL_TABLET | Freq: Every day | ORAL | Status: DC
  Administered 2015-06-17 – 2015-06-18 (×2): 40 mg via ORAL
  Filled 2015-06-16 (×2): qty 1

## 2015-06-16 NOTE — ED Notes (Signed)
Patient transported to X-ray 

## 2015-06-16 NOTE — ED Notes (Signed)
PER [facility]: Patient to ED from home c/o flu-like sx (generalized body aches, N/V, chills) x 3 days. HX CHF, ICD(from aortic artery blood clot) - pt thought she felt her ICD vibrate earlier, but doesn't know what it's supposed to feel like. Patient denies CP or diarrhea. 22g. L hand, EMS gave 8mg  zofran. EMS VS: 132/80, HR 82 NSR, RR 18, CBG 136. Patient A&O x 4, respirations e/u.

## 2015-06-16 NOTE — ED Provider Notes (Signed)
Patient seen/examined in the Emergency Department in conjunction with Midlevel Provider Rose Patient reports nausea/vomiting for over 2 days.  She has no specific pain complaints Exam : awake/alert appears uncomfortable and she is mildly agitated.  No abd tenderness.  Distal pulses intact Plan: pt with vomiting and not feeling well.  Labs pending at this time She has complicated cardiac/vascular history.  May need further imaging if symptoms do not improve, may need CT imaging of abd/pelvis    Zadie Rhine, MD 06/16/15 [phone]
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
## 2015-06-16 NOTE — Progress Notes (Signed)
Patient currently awake but resting in bed. Currently has mild nausea and has burning sensation in chest area. Protonix, Zofran given as ordered. Please note last dose of IV Potassium given but patient can only tolerate it at a rate of 25 otherwise will have nausea and vomiting per the patient stating that to the nurse. Will continue to monitor patient to end of shift.

## 2015-06-16 NOTE — ED Notes (Signed)
Pt given water 

## 2015-06-16 NOTE — ED Notes (Signed)
PA-C at bedside 

## 2015-06-16 NOTE — ED Notes (Signed)
PA at bedside.

## 2015-06-16 NOTE — ED Notes (Signed)
T\his RN verified result of POC urine test with minilab:  Negative.  CT made aware.

## 2015-06-16 NOTE — ED Notes (Signed)
Pt given ginger ale to drink. Pt will let us know how she tolerates the ginger ale.
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
## 2015-06-17 ENCOUNTER — Encounter (HOSPITAL_COMMUNITY): Payer: Self-pay | Admitting: Rehabilitation

## 2015-06-17 DIAGNOSIS — R112 Nausea with vomiting, unspecified: Secondary | ICD-10-CM | POA: Diagnosis not present

## 2015-06-17 DIAGNOSIS — I5022 Chronic systolic (congestive) heart failure: Secondary | ICD-10-CM | POA: Diagnosis not present

## 2015-06-17 DIAGNOSIS — Z95810 Presence of automatic (implantable) cardiac defibrillator: Secondary | ICD-10-CM | POA: Diagnosis not present

## 2015-06-17 DIAGNOSIS — I429 Cardiomyopathy, unspecified: Secondary | ICD-10-CM

## 2015-06-17 LAB — BASIC METABOLIC PANEL
ANION GAP: 13 (ref 5–15)
BUN: 6 mg/dL (ref 6–20)
CALCIUM: 8.8 mg/dL — AB (ref 8.9–10.3)
CO2: 25 mmol/L (ref 22–32)
Chloride: 100 mmol/L — ABNORMAL LOW (ref 101–111)
Creatinine, Ser: 0.87 mg/dL (ref 0.44–1.00)
GFR calc Af Amer: >60 mL/min (ref >60)
GFR calc non Af Amer: >60 mL/min (ref >60)
GLUCOSE: 92 mg/dL (ref 65–99)
Potassium: 3.3 mmol/L — ABNORMAL LOW (ref 3.5–5.1)
Sodium: 138 mmol/L (ref 135–145)

## 2015-06-17 LAB — CBC
HEMATOCRIT: 40.2 % (ref 36.0–46.0)
Hemoglobin: 13 g/dL (ref 12.0–15.0)
MCH: 29.5 pg (ref 26.0–34.0)
MCHC: 32.3 g/dL (ref 30.0–36.0)
MCV: 91.2 fL (ref 78.0–100.0)
Platelets: 246 10*3/uL (ref 150–400)
RBC: 4.41 MIL/uL (ref 3.87–5.11)
RDW: 15.2 % (ref 11.5–15.5)
WBC: 14.3 10*3/uL — AB (ref 4.0–10.5)

## 2015-06-17 MED ORDER — ACETAMINOPHEN 325 MG PO TABS
650.0000 mg | ORAL_TABLET | ORAL | Status: DC | PRN
  Administered 2015-06-18: 650 mg via ORAL
  Filled 2015-06-17 (×2): qty 2

## 2015-06-17 MED ORDER — GI COCKTAIL ~~LOC~~
30.0000 mL | Freq: Three times a day (TID) | ORAL | Status: DC | PRN
  Administered 2015-06-17 – 2015-06-19 (×5): 30 mL via ORAL
  Filled 2015-06-17 (×5): qty 30

## 2015-06-17 MED ORDER — POTASSIUM CHLORIDE CRYS ER 20 MEQ PO TBCR
40.0000 meq | EXTENDED_RELEASE_TABLET | Freq: Once | ORAL | Status: AC
  Administered 2015-06-17: 40 meq via ORAL
  Filled 2015-06-17: qty 2

## 2015-06-17 MED ORDER — ENOXAPARIN SODIUM 40 MG/0.4ML ~~LOC~~ SOLN: 40.0000 mg | SUBCUTANEOUS | Status: DC

## 2015-06-17 MED ORDER — SODIUM CHLORIDE 0.9 % IV SOLN
INTRAVENOUS | Status: DC
  Administered 2015-06-18 (×2): via INTRAVENOUS

## 2015-06-17 MED ORDER — ENOXAPARIN SODIUM 40 MG/0.4ML ~~LOC~~ SOLN
40.0000 mg | SUBCUTANEOUS | Status: DC
  Administered 2015-06-17 – 2015-06-18 (×2): 40 mg via SUBCUTANEOUS
  Filled 2015-06-17 (×2): qty 0.4

## 2015-06-17 MED ORDER — LISINOPRIL 2.5 MG PO TABS
2.5000 mg | ORAL_TABLET | Freq: Every day | ORAL | Status: DC
  Administered 2015-06-17 – 2015-06-18 (×2): 2.5 mg via ORAL
  Filled 2015-06-17 (×2): qty 1

## 2015-06-17 NOTE — Progress Notes (Signed)
[facility] Progress Note   Diana Moreno  WUJ:[id number]  DOB: [birth year]  DOA: 06/16/2015 PCP: Laren Boom, DO  Brief narrative: Diana Moreno is a 40 y.o. 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   Subjective: Has burning pain in throat and upper abdomen. Mild nausea but no further vomiting.   Assessment/Plan: Principal Problem:   Nausea and vomiting - abdominal CT unrevealing - improving with bowel rest-  - try GI cocktail with Lidocain for throat and epigastric pain - cont symptomatic management, Protonix and IVF   Active Problems:  Hypokalemia - replace - Mg normal    Tobacco abuse - advised to quit    Automatic implantable cardioverter-defibrillator in situ    Cardiomyopathy, nonischemic -- Chronic systolic (congestive) heart failure (HCC) - cont home meds- bv blocker and ACE I     PVD (peripheral vascular disease) -- S/P aortobifemoral bypass surgery    Antibiotics: Anti-infectives    None     Code Status:     Code Status Orders        Start     Ordered   06/16/15 1613  Full code   Continuous     06/16/15 1613    Code Status History    Date Active Date Inactive Code Status Order ID Comments User Context   09/15/2014  2:26 PM 09/16/2014  5:11 PM Full Code [id number]  Darrol Jump, PA-C Inpatient   09/15/2014 12:24 PM 09/15/2014  2:26 PM Full Code [id number]  Charlynne Pander, DDS Inpatient   09/30/2013  1:31 PM 10/01/2013  3:53 PM Full Code [id number]  Duke Salvia, MD Inpatient   08/02/2012  5:50 PM 08/10/2012  5:13 PM Full  Code [id number]  Marlowe Shores, PA-C Inpatient     Family Communication:   Disposition Plan: home in AM if improved DVT prophylaxis: Lovenox Consultants:  Procedures:     Objective: Filed Weights   06/16/15 0641 06/16/15 1823 06/17/15 0433  Weight: 76.204 kg (168 lb) 76.975 kg (169 lb 11.2 oz) 77.656 kg (171 lb 3.2 oz)    Intake/Output Summary (Last 24 hours) at 06/17/15 1417 Last data filed at 06/17/15 1215  Gross per 24 hour  Intake 2036.75 ml  Output   1650 ml  Net 386.75 ml     Vitals Filed Vitals:   06/17/15 0001 06/17/15 0433 06/17/15 0752 06/17/15 1214  BP: 91/53 95/60 129/74 103/63  Pulse: 54 73 75 71  Temp:  97.9 F (36.6 C)  97.3 F (36.3 C)  TempSrc:  Oral  Oral  Resp: Height:      Weight:  77.656 kg (171 lb 3.2 oz)    SpO2: 94% 95% 97% 97%    Exam:  General:  Pt is alert, not in acute distress  HEENT: No icterus, No thrush, oral mucosa moist  Cardiovascular: regular rate and rhythm, S1/S2 No murmur  Respiratory: clear to auscultation bilaterally   Abdomen: Soft, +Bowel sounds,mildly tender in epigastrium, non distended, no guarding  MSK: No cyanosis or clubbing- no pedal edema   Data Reviewed: Basic Metabolic Panel:  Recent Labs Lab  06/16/15 0647 06/16/15 1729 06/17/15 0933  NA 137  --  138  K 3.2*  --  3.3*  CL 95*  --  100*  CO2 26  --  25  GLUCOSE 139*  --  92  BUN 6  --  6  CREATININE 0.93  --  0.87  CALCIUM 9.8  --  8.8*  MG  --  1.9  --    Liver Function Tests:  Recent Labs Lab 06/16/15 0647  AST 22  ALT 21  ALKPHOS 98  BILITOT 0.9  PROT 7.0  ALBUMIN 4.2    Recent Labs Lab 06/16/15 0647  LIPASE 21   No results for input(s): AMMONIA in the last 168 hours. CBC:  Recent Labs Lab 06/16/15 0647 06/17/15 0933  WBC 23.4* 14.3*  HGB 15.5* 13.0  HCT 45.6 40.2  MCV 88.7 91.2  PLT 291 246   Cardiac Enzymes: No results for input(s): CKTOTAL, CKMB, CKMBINDEX, TROPONINI in the last 168  hours. BNP (last 3 results) No results for input(s): BNP in the last 8760 hours.  ProBNP (last 3 results) No results for input(s): PROBNP in the last 8760 hours.  CBG: No results for input(s): GLUCAP in the last 168 hours.  No results found for this or any previous visit (from the past 240 hour(s)).   Studies: Dg Chest 2 View  06/16/2015  CLINICAL DATA:  Chills and vomiting for 3 days. EXAM: CHEST  2 VIEW COMPARISON:  01/27/2014 FINDINGS: Single lead cardiac pacemaker is in stable position. No lead interruption is seen. Cardiomediastinal silhouette is normal. Mediastinal contours appear intact. There is no evidence of focal airspace consolidation, pleural effusion or pneumothorax. Osseous structures are without acute abnormality. Soft tissues are grossly normal. IMPRESSION: No active cardiopulmonary disease. Electronically Signed   By: Ted Mcalpine M.D.   On: 06/16/2015 07:54   Ct Abdomen Pelvis W Contrast  06/16/2015  CLINICAL DATA:  Three-day history abdominal pain with nausea and vomiting EXAM: CT ABDOMEN AND PELVIS WITH CONTRAST TECHNIQUE: Multidetector CT imaging of the abdomen and pelvis was performed using the standard protocol following bolus administration of intravenous contrast. CONTRAST:  OMNIPAQUE IOHEXOL 300 MG/ML  SOLN COMPARISON:  August 01, 2012 FINDINGS: Lower chest: Lung bases are clear. Pacemaker leads attached to right atrium and right ventricle. Small hiatal hernia present. Hepatobiliary: No focal liver lesions are identified. Gallbladder wall is not appreciably thickened. There is no biliary duct dilatation. Pancreas: There is no pancreatic mass or inflammatory focus. Spleen: No splenic lesions are identified. Adrenals/Urinary Tract: There is a stable 2.1 x 1.9 cm presumed adenoma arising from the left adrenal. There is an apparent right adrenal adenoma measuring 1.1 x 0.9 cm. Kidneys bilaterally show no mass or hydronephrosis on either side. There is mild scarring  in the posterior upper pole right kidney. There is no renal or ureteral calculus on either side. Urinary bladder is midline with wall thickness within normal limits. Stomach/Bowel: There is no bowel wall or mesenteric thickening. There is no apparent bowel obstruction. No free air or portal venous air. Vascular/Lymphatic: Postoperative changes noted in the region of the aortic bifurcation and distal common femoral artery regions. These vessels currently are patent without aneurysm. Major mesenteric vessels appear patent. No vascular lesions are apparent currently. There is no adenopathy in the abdomen or pelvis. Reproductive: Uterus is anteverted. There is a presumed dominant follicle in the right ovary measuring 2.7 x 2.1 cm. A dominant follicle in the left ovary measures 2.6 x  2.3 cm. Beyond these presumed follicles, there is no pelvic mass. There is no pelvic fluid collection. Other: Appendix appears normal. No ascites or abscess is apparent in the abdomen or pelvis. There is a small ventral hernia containing only fat. Musculoskeletal: There are no blastic or lytic bone lesions. There is no intramuscular or abdominal wall lesion. IMPRESSION: No bowel obstruction or bowel wall thickening.  No abscess. Appendix appears normal. No renal or ureteral calculus.  No hydronephrosis. 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 Small ventral hernia containing only fat. Small hiatal hernia present. Probable dominant follicles in each ovary. No pelvic fluid collection or free pelvic fluid. Electronically Signed   By: Bretta Bang III M.D.   On: 06/16/2015 11:03    Scheduled Meds:  Scheduled Meds: . atorvastatin  40 mg Oral q1800  . carvedilol  3.125 mg Oral BID WC  . clonazePAM  1 mg Oral BID  . lisinopril  2.5 mg Oral QHS  . pantoprazole (PROTONIX) IV  40 mg Intravenous Q12H  . sertraline  50 mg Oral Daily  . sodium chloride flush  3 mL Intravenous Q12H   Continuous Infusions: . sodium  chloride 100 mL/hr at 06/17/15 0900    Time spent on care of this patient: 35 min   Renly Guedes, MD 06/17/2015, 2:17 PM    [facility] Office  [phone] Pager - Text Page per [email or web address] If 7PM-7AM, please contact night-coverage [email or web address]

## 2015-06-18 DIAGNOSIS — Z95828 Presence of other vascular implants and grafts: Secondary | ICD-10-CM | POA: Diagnosis not present

## 2015-06-18 DIAGNOSIS — M549 Dorsalgia, unspecified: Secondary | ICD-10-CM | POA: Diagnosis present

## 2015-06-18 DIAGNOSIS — F319 Bipolar disorder, unspecified: Secondary | ICD-10-CM | POA: Diagnosis present

## 2015-06-18 DIAGNOSIS — I429 Cardiomyopathy, unspecified: Secondary | ICD-10-CM | POA: Diagnosis not present

## 2015-06-18 DIAGNOSIS — G8929 Other chronic pain: Secondary | ICD-10-CM | POA: Diagnosis present

## 2015-06-18 DIAGNOSIS — F419 Anxiety disorder, unspecified: Secondary | ICD-10-CM | POA: Diagnosis present

## 2015-06-18 DIAGNOSIS — I5022 Chronic systolic (congestive) heart failure: Secondary | ICD-10-CM | POA: Diagnosis not present

## 2015-06-18 DIAGNOSIS — I110 Hypertensive heart disease with heart failure: Secondary | ICD-10-CM | POA: Diagnosis present

## 2015-06-18 DIAGNOSIS — R110 Nausea: Secondary | ICD-10-CM | POA: Diagnosis present

## 2015-06-18 DIAGNOSIS — Z95810 Presence of automatic (implantable) cardiac defibrillator: Secondary | ICD-10-CM | POA: Diagnosis not present

## 2015-06-18 DIAGNOSIS — I739 Peripheral vascular disease, unspecified: Secondary | ICD-10-CM | POA: Diagnosis present

## 2015-06-18 DIAGNOSIS — R1013 Epigastric pain: Secondary | ICD-10-CM | POA: Diagnosis present

## 2015-06-18 DIAGNOSIS — R112 Nausea with vomiting, unspecified: Secondary | ICD-10-CM | POA: Diagnosis not present

## 2015-06-18 DIAGNOSIS — F17210 Nicotine dependence, cigarettes, uncomplicated: Secondary | ICD-10-CM | POA: Diagnosis present

## 2015-06-18 DIAGNOSIS — Z720 Tobacco use: Secondary | ICD-10-CM

## 2015-06-18 DIAGNOSIS — F4310 Post-traumatic stress disorder, unspecified: Secondary | ICD-10-CM | POA: Diagnosis present

## 2015-06-18 DIAGNOSIS — I2510 Atherosclerotic heart disease of native coronary artery without angina pectoris: Secondary | ICD-10-CM | POA: Diagnosis present

## 2015-06-18 DIAGNOSIS — E876 Hypokalemia: Secondary | ICD-10-CM | POA: Diagnosis present

## 2015-06-18 DIAGNOSIS — E860 Dehydration: Secondary | ICD-10-CM | POA: Diagnosis present

## 2015-06-18 LAB — BASIC METABOLIC PANEL
ANION GAP: 6 (ref 5–15)
BUN: <5 mg/dL — ABNORMAL LOW (ref 6–20)
CHLORIDE: 108 mmol/L (ref 101–111)
CO2: 23 mmol/L (ref 22–32)
Calcium: 8.5 mg/dL — ABNORMAL LOW (ref 8.9–10.3)
Creatinine, Ser: 0.83 mg/dL (ref 0.44–1.00)
GFR calc non Af Amer: >60 mL/min (ref >60)
Glucose, Bld: 105 mg/dL — ABNORMAL HIGH (ref 65–99)
POTASSIUM: 3.3 mmol/L — AB (ref 3.5–5.1)
SODIUM: 137 mmol/L (ref 135–145)

## 2015-06-18 MED ORDER — POTASSIUM CHLORIDE CRYS ER 20 MEQ PO TBCR
40.0000 meq | EXTENDED_RELEASE_TABLET | Freq: Once | ORAL | Status: AC
  Administered 2015-06-18: 40 meq via ORAL
  Filled 2015-06-18: qty 2

## 2015-06-18 MED ORDER — POTASSIUM CHLORIDE 10 MEQ/100ML IV SOLN
10.0000 meq | Freq: Once | INTRAVENOUS | Status: AC
  Administered 2015-06-18: 10 meq via INTRAVENOUS

## 2015-06-18 MED ORDER — POTASSIUM CHLORIDE 10 MEQ/100ML IV SOLN
10.0000 meq | INTRAVENOUS | Status: AC
  Administered 2015-06-18: 10 meq via INTRAVENOUS
  Filled 2015-06-18 (×2): qty 100

## 2015-06-18 MED ORDER — PANTOPRAZOLE SODIUM 40 MG PO TBEC
40.0000 mg | DELAYED_RELEASE_TABLET | Freq: Two times a day (BID) | ORAL | Status: DC
  Administered 2015-06-18 – 2015-06-19 (×2): 40 mg via ORAL
  Filled 2015-06-18 (×2): qty 1

## 2015-06-18 MED ORDER — NICOTINE 7 MG/24HR TD PT24
7.0000 mg | MEDICATED_PATCH | Freq: Every day | TRANSDERMAL | Status: DC
  Administered 2015-06-18 – 2015-06-19 (×2): 7 mg via TRANSDERMAL
  Filled 2015-06-18 (×2): qty 1

## 2015-06-18 MED ORDER — BISACODYL 5 MG PO TBEC
10.0000 mg | DELAYED_RELEASE_TABLET | Freq: Every day | ORAL | Status: DC | PRN
  Administered 2015-06-18: 10 mg via ORAL
  Filled 2015-06-18: qty 2

## 2015-06-18 MED ORDER — BISACODYL 5 MG PO TBEC: 10.0000 mg | DELAYED_RELEASE_TABLET | Freq: Every day | ORAL | Status: DC

## 2015-06-18 NOTE — Progress Notes (Signed)
[facility] Progress Note   Diana Moreno  VOJ:[id number]  DOB: Sep 04, 1975  DOA: 06/16/2015 PCP: Laren Boom, DO  Brief narrative: Diana Moreno is a 40 y.o. 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   Subjective: Continues to have indigestion and burning in her chest and abdomen  Assessment/Plan: Principal Problem:   Nausea and vomiting - abdominal CT unrevealing - improving with bowel rest but not fully resolved- still having epigastric pain and trouble with solid food -  GI cocktail with Lidocain for throat and epigastric pain is helping - cont symptomatic management, Protonix and IVF  - WBC count improved to 14.3  Active Problems:  Hypokalemia - due to poor PO intake  - replace again today - Mg was normal    Tobacco abuse - advised to quit    Automatic implantable cardioverter-defibrillator in situ    Cardiomyopathy, nonischemic -- Chronic systolic (congestive) heart failure (HCC) - cont home meds- B blocker and ACE I     PVD (peripheral vascular disease) -- S/P aortobifemoral bypass surgery    Antibiotics: Anti-infectives    None     Code Status:     Code Status Orders        Start     Ordered   06/16/15 1613  Full code   Continuous     06/16/15 1613    Code Status History    Date Active Date Inactive Code Status Order ID Comments User Context   09/15/2014  2:26 PM 09/16/2014  5:11 PM Full Code [id number]  Darrol Jump, PA-C Inpatient   09/15/2014 12:24 PM 09/15/2014  2:26 PM Full Code [id number]  Charlynne Pander, DDS Inpatient    09/30/2013  1:31 PM 10/01/2013  3:53 PM Full Code [id number]  Duke Salvia, MD Inpatient   08/02/2012  5:50 PM 08/10/2012  5:13 PM Full Code [id number]  Marlowe Shores, PA-C Inpatient     Family Communication:   Disposition Plan: home in AM if improved DVT prophylaxis: Lovenox Consultants:  Procedures:     Objective: Filed Weights   06/16/15 1823 06/17/15 0433 06/18/15 0404  Weight: 76.975 kg (169 lb 11.2 oz) 77.656 kg (171 lb 3.2 oz) 79.017 kg (174 lb 3.2 oz)    Intake/Output Summary (Last 24 hours) at 06/18/15 1301 Last data filed at 06/18/15 1100  Gross per 24 hour  Intake    703 ml  Output   2300 ml  Net  -1597 ml     Vitals Filed Vitals:   06/17/15 2223 06/18/15 0404 06/18/15 0750 06/18/15 1233  BP: 105/65 92/40 109/70 109/69  Pulse: 54 65 62 80  Temp: 98.4 F (36.9 C) 97.7 F (36.5 C)  97.7 F (36.5 C)  TempSrc: Oral Oral  Axillary  Resp: 18 18    Height:      Weight:  79.017 kg (174 lb 3.2 oz)    SpO2: 99% 98%  100%    Exam:  General:  Pt is alert, not in acute distress  HEENT: No icterus, No thrush, oral mucosa moist  Cardiovascular: regular rate and rhythm, S1/S2 No murmur  Respiratory: clear to  auscultation bilaterally   Abdomen: Soft, +Bowel sounds,mildly tender in epigastrium, non distended, no guarding  MSK: No cyanosis or clubbing- no pedal edema   Data Reviewed: Basic Metabolic Panel:  Recent Labs Lab 06/16/15 0647 06/16/15 1729 06/17/15 0933 06/18/15 0401  NA 137  --  138 137  K 3.2*  --  3.3* 3.3*  CL 95*  --  100* 108  CO2 26  --  25 23  GLUCOSE 139*  --  92 105*  BUN 6  --  6 <5*  CREATININE 0.93  --  0.87 0.83  CALCIUM 9.8  --  8.8* 8.5*  MG  --  1.9  --   --    Liver Function Tests:  Recent Labs Lab 06/16/15 0647  AST 22  ALT 21  ALKPHOS 98  BILITOT 0.9  PROT 7.0  ALBUMIN 4.2    Recent Labs Lab 06/16/15 0647  LIPASE 21   No results for input(s): AMMONIA in the last 168 hours. CBC:  Recent Labs Lab  06/16/15 0647 06/17/15 0933  WBC 23.4* 14.3*  HGB 15.5* 13.0  HCT 45.6 40.2  MCV 88.7 91.2  PLT 291 246   Cardiac Enzymes: No results for input(s): CKTOTAL, CKMB, CKMBINDEX, TROPONINI in the last 168 hours. BNP (last 3 results) No results for input(s): BNP in the last 8760 hours.  ProBNP (last 3 results) No results for input(s): PROBNP in the last 8760 hours.  CBG: No results for input(s): GLUCAP in the last 168 hours.  No results found for this or any previous visit (from the past 240 hour(s)).   Studies: No results found.  Scheduled Meds:  Scheduled Meds: . atorvastatin  40 mg Oral q1800  . carvedilol  3.125 mg Oral BID WC  . clonazePAM  1 mg Oral BID  . enoxaparin (LOVENOX) injection  40 mg Subcutaneous Q24H  . lisinopril  2.5 mg Oral QHS  . nicotine  7 mg Transdermal Daily  . pantoprazole (PROTONIX) IV  40 mg Intravenous Q12H  . sertraline  50 mg Oral Daily  . sodium chloride flush  3 mL Intravenous Q12H   Continuous Infusions: . sodium chloride 100 mL/hr at 06/18/15 1125    Time spent on care of this patient: 35 min   Kalyna Paolella, MD 06/18/2015, 1:01 PM    [facility] Office  [phone] Pager - Text Page per [email or web address] If 7PM-7AM, please contact night-coverage [email or web address]

## 2015-06-18 NOTE — Progress Notes (Signed)
Pt a/o, no c/o pain, pt stated she is still having some nausea but feels better when resting and did not want any medication for nausea, IVF NS @ 100 ml/hr, VSS, pt resting comfortably, pt stable

## 2015-06-19 ENCOUNTER — Telehealth: Payer: Self-pay | Admitting: Internal Medicine

## 2015-06-19 LAB — CBC
HCT: 39.7 % (ref 36.0–46.0)
Hemoglobin: 12.2 g/dL (ref 12.0–15.0)
MCH: 29 pg (ref 26.0–34.0)
MCHC: 30.7 g/dL (ref 30.0–36.0)
MCV: 94.5 fL (ref 78.0–100.0)
PLATELETS: 218 10*3/uL (ref 150–400)
RBC: 4.2 MIL/uL (ref 3.87–5.11)
RDW: 15.6 % — AB (ref 11.5–15.5)
WBC: 10.3 10*3/uL (ref 4.0–10.5)

## 2015-06-19 LAB — BASIC METABOLIC PANEL
Anion gap: 8 (ref 5–15)
BUN: 6 mg/dL (ref 6–20)
CALCIUM: 8.8 mg/dL — AB (ref 8.9–10.3)
CO2: 22 mmol/L (ref 22–32)
CREATININE: 0.83 mg/dL (ref 0.44–1.00)
Chloride: 109 mmol/L (ref 101–111)
GFR calc Af Amer: >60 mL/min (ref >60)
GLUCOSE: 111 mg/dL — AB (ref 65–99)
POTASSIUM: 4.1 mmol/L (ref 3.5–5.1)
SODIUM: 139 mmol/L (ref 135–145)

## 2015-06-19 MED ORDER — TRAMADOL HCL 50 MG PO TABS: 50.0000 mg | ORAL_TABLET | Freq: Four times a day (QID) | ORAL | Status: DC | PRN

## 2015-06-19 MED ORDER — GABAPENTIN 100 MG PO CAPS: 100.0000 mg | ORAL_CAPSULE | Freq: Three times a day (TID) | ORAL | Status: DC

## 2015-06-19 NOTE — Progress Notes (Signed)
All d/c instructions explained and given to pt.  Verbalized understanding. .  Shabazz Mckey, RN. 

## 2015-06-19 NOTE — Telephone Encounter (Signed)
Pt would like to know the status of her referral to a new primary doctor.

## 2015-06-19 NOTE — Progress Notes (Signed)
At 1027 pt d/c to home.  Left via ambulatory.  Declined w/c services.  Joelynn Dust,RN.

## 2015-06-19 NOTE — Telephone Encounter (Signed)
Spoke with patient. She has things taken care of.  She will be going into [facility] on [address] to fill out her papers and make an appointment at that time She is appreciative for the call back and will call back if we can be of assistance.

## 2015-06-19 NOTE — Discharge Summary (Addendum)
Physician Discharge Summary  Diana Moreno ZOX:[id number] DOB: [birth year] DOA: 06/16/2015  PCP: Laren Boom, DO  Admit date: 06/16/2015 Discharge date: 06/19/2015  Time spent: 50 minutes     Discharge Condition: stable    Discharge Diagnoses:  Principal Problem:   Nausea and vomiting Active Problems:   S/P aortobifemoral bypass surgery   Tobacco abuse   PVD (peripheral vascular disease) (HCC)   Automatic implantable cardioverter-defibrillator in situ   Cardiomyopathy, nonischemic (HCC)   Chronic systolic (congestive) heart failure (HCC)   History of present illness:  Diana Moreno is a 40 y.o. 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   Hospital Course:  Principal Problem:  Nausea and vomiting - abdominal CT unrevealing - improving with bowel rest but not fully resolved- still having epigastric pain and trouble with solid food - GI cocktail with Lidocain for throat and epigastric pain has helped - given symptomatic management with Zofran, Protonix and IVF  - WBC count has normalized and symptoms resolved- now tolerating sold diet  Active Problems:  Hypokalemia - due to poor PO intake  - replaced - Mg was normal   Tobacco abuse - advised to quit   Automatic implantable cardioverter-defibrillator in situ   Cardiomyopathy, nonischemic -- Chronic systolic (congestive) heart failure (HCC) - cont home meds- B blocker and ACE I   PVD (peripheral vascular disease) -- S/P aortobifemoral bypass surgery  Discharge Exam: Filed Weights   06/17/15  0433 06/18/15 0404 06/19/15 0428  Weight: 77.656 kg (171 lb 3.2 oz) 79.017 kg (174 lb 3.2 oz) 80.513 kg (177 lb 8 oz)   Filed Vitals:   06/19/15 0741 06/19/15 0821  BP: 114/80 100/78  Pulse: 63 66  Temp:    Resp:  16    General: AAO x 3, no distress Cardiovascular: RRR, no murmurs  Respiratory: clear to auscultation bilaterally GI: soft, non-tender, non-distended, bowel sound positive  Discharge Instructions You were cared for by a hospitalist during your hospital stay. If you have any questions about your discharge medications or the care you received while you were in the hospital after you are discharged, you can call the unit and asked to speak with the hospitalist on call if the hospitalist that took care of you is not available. Once you are discharged, your primary care physician will handle any further medical issues. Please note that NO REFILLS for any discharge medications will be authorized once you are discharged, as it is imperative that you return to your primary care physician (or establish a relationship with a primary care physician if you do not have one) for your aftercare needs so that they can reassess your need for medications and monitor your lab values.  Discharge Instructions    Diet - low sodium heart healthy    Complete by:  As directed      Increase activity slowly    Complete by:  As directed             Medication List    TAKE these medications        acetaminophen 500 MG tablet  Commonly known as:  TYLENOL  Take  1,000 mg by mouth every 6 (six) hours as needed for pain or fever.     atorvastatin 40 MG tablet  Commonly known as:  LIPITOR  Take 1 tablet (40 mg total) by mouth daily at 6 PM.     carvedilol 3.125 MG tablet  Commonly known as:  COREG  Take 1 tablet (3.125 mg total) by mouth 2 (two) times daily with a meal.     clonazePAM 1 MG tablet  Commonly known as:  KLONOPIN  Take 1 mg by mouth 2 (two) times daily.     gabapentin 100 MG  capsule  Commonly known as:  NEURONTIN  Take 1 capsule (100 mg total) by mouth 3 (three) times daily.     lisinopril 2.5 MG tablet  Commonly known as:  PRINIVIL,ZESTRIL  Take 1 tablet (2.5 mg total) by mouth at bedtime.     sertraline 50 MG tablet  Commonly known as:  ZOLOFT  Take 50 mg by mouth daily.     traMADol 50 MG tablet  Commonly known as:  ULTRAM  Take 1 tablet (50 mg total) by mouth every 6 (six) hours as needed for moderate pain.       Allergies  Allergen Reactions  . Sulfa Antibiotics Hives  . Sulfur Itching    Actually a Sulfonamide antibiotic allergy with Hives reaction       The results of significant diagnostics from this hospitalization (including imaging, microbiology, ancillary and laboratory) are listed below for reference.    Significant Diagnostic Studies: Dg Chest 2 View  06/16/2015  CLINICAL DATA:  Chills and vomiting for 3 days. EXAM: CHEST  2 VIEW COMPARISON:  01/27/2014 FINDINGS: Single lead cardiac pacemaker is in stable position. No lead interruption is seen. Cardiomediastinal silhouette is normal. Mediastinal contours appear intact. There is no evidence of focal airspace consolidation, pleural effusion or pneumothorax. Osseous structures are without acute abnormality. Soft tissues are grossly normal. IMPRESSION: No active cardiopulmonary disease. Electronically Signed   By: Ted Mcalpine M.D.   On: 06/16/2015 07:54   Ct Abdomen Pelvis W Contrast  06/16/2015  CLINICAL DATA:  Three-day history abdominal pain with nausea and vomiting EXAM: CT ABDOMEN AND PELVIS WITH CONTRAST TECHNIQUE: Multidetector CT imaging of the abdomen and pelvis was performed using the standard protocol following bolus administration of intravenous contrast. CONTRAST:  OMNIPAQUE IOHEXOL 300 MG/ML  SOLN COMPARISON:  August 01, 2012 FINDINGS: Lower chest: Lung bases are clear. Pacemaker leads attached to right atrium and right ventricle. Small hiatal hernia present.  Hepatobiliary: No focal liver lesions are identified. Gallbladder wall is not appreciably thickened. There is no biliary duct dilatation. Pancreas: There is no pancreatic mass or inflammatory focus. Spleen: No splenic lesions are identified. Adrenals/Urinary Tract: There is a stable 2.1 x 1.9 cm presumed adenoma arising from the left adrenal. There is an apparent right adrenal adenoma measuring 1.1 x 0.9 cm. Kidneys bilaterally show no mass or hydronephrosis on either side. There is mild scarring in the posterior upper pole right kidney. There is no renal or ureteral calculus on either side. Urinary bladder is midline with wall thickness within normal limits. Stomach/Bowel: There is no bowel wall or mesenteric thickening. There is no apparent bowel obstruction. No free air or portal venous air. Vascular/Lymphatic: Postoperative changes noted in the region of the aortic bifurcation and distal common femoral artery regions. These vessels currently are patent without aneurysm. Major mesenteric vessels appear patent. No vascular lesions are apparent currently. There is no adenopathy in  the abdomen or pelvis. Reproductive: Uterus is anteverted. There is a presumed dominant follicle in the right ovary measuring 2.7 x 2.1 cm. A dominant follicle in the left ovary measures 2.6 x 2.3 cm. Beyond these presumed follicles, there is no pelvic mass. There is no pelvic fluid collection. Other: Appendix appears normal. No ascites or abscess is apparent in the abdomen or pelvis. There is a small ventral hernia containing only fat. Musculoskeletal: There are no blastic or lytic bone lesions. There is no intramuscular or abdominal wall lesion. IMPRESSION: No bowel obstruction or bowel wall thickening.  No abscess. Appendix appears normal. No renal or ureteral calculus.  No hydronephrosis. 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 Small ventral hernia containing only fat. Small hiatal hernia present. Probable  dominant follicles in each ovary. No pelvic fluid collection or free pelvic fluid. Electronically Signed   By: Bretta Bang III M.D.   On: 06/16/2015 11:03    Microbiology: No results found for this or any previous visit (from the past 240 hour(s)).   Labs: Basic Metabolic Panel:  Recent Labs Lab 06/16/15 0647 06/16/15 1729 06/17/15 0933 06/18/15 0401 06/19/15 0507  NA 137  --  138 137 139  K 3.2*  --  3.3* 3.3* 4.1  CL 95*  --  100* 108 109  CO2 26  --  25 23 22   GLUCOSE 139*  --  92 105* 111*  BUN 6  --  6 <5* 6  CREATININE 0.93  --  0.87 0.83 0.83  CALCIUM 9.8  --  8.8* 8.5* 8.8*  MG  --  1.9  --   --   --    Liver Function Tests:  Recent Labs Lab 06/16/15 0647  AST 22  ALT 21  ALKPHOS 98  BILITOT 0.9  PROT 7.0  ALBUMIN 4.2    Recent Labs Lab 06/16/15 0647  LIPASE 21   No results for input(s): AMMONIA in the last 168 hours. CBC:  Recent Labs Lab 06/16/15 0647 06/17/15 0933 06/19/15 0507  WBC 23.4* 14.3* 10.3  HGB 15.5* 13.0 12.2  HCT 45.6 40.2 39.7  MCV 88.7 91.2 94.5  PLT 291 246 218   Cardiac Enzymes: No results for input(s): CKTOTAL, CKMB, CKMBINDEX, TROPONINI in the last 168 hours. BNP: BNP (last 3 results) No results for input(s): BNP in the last 8760 hours.  ProBNP (last 3 results) No results for input(s): PROBNP in the last 8760 hours.  CBG: No results for input(s): GLUCAP in the last 168 hours.     SignedCalvert Cantor, MD [facility] 06/19/2015, 8:55 AM

## 2015-07-10 ENCOUNTER — Emergency Department (INDEPENDENT_AMBULATORY_CARE_PROVIDER_SITE_OTHER)
Admission: EM | Admit: 2015-07-10 | Discharge: 2015-07-10 | Disposition: A | Discharge status: Home / Self Care | Payer: Medicaid Other | Source: Home / Self Care | Attending: Family Medicine | Admitting: Family Medicine

## 2015-07-10 ENCOUNTER — Encounter: Payer: Self-pay | Admitting: Student

## 2015-07-10 DIAGNOSIS — T23201A Burn of second degree of right hand, unspecified site, initial encounter: Secondary | ICD-10-CM

## 2015-07-10 DIAGNOSIS — T23231A Burn of second degree of multiple right fingers (nail), not including thumb, initial encounter: Principal | ICD-10-CM

## 2015-07-10 MED ORDER — MUPIROCIN CALCIUM 2 % NA OINT: TOPICAL_OINTMENT | NASAL | Status: DC

## 2015-07-10 MED ORDER — BACITRACIN ZINC 500 UNIT/GM EX OINT
TOPICAL_OINTMENT | CUTANEOUS | Status: AC
  Filled 2015-07-10: qty 1.8

## 2015-07-10 NOTE — Discharge Instructions (Signed)
Second-Degree Burn °A second-degree burn affects the 2 outer layers of skin. The outer layer (epidermis) and the layer underneath it (dermis) are both burned. Another name for this type of burn is a partial thickness burn. A second-degree burn may be called minor or major. This depends on the size of the burn. It also depends on what parts of the skin are burned. Minor burns may be treated with first aid. Major burns are a medical emergency. °A second-degree burn is worse than a first-degree burn, but not as bad as a third-degree burn. A first-degree burn affects only the epidermis. A third-degree burn goes through all the layers of skin. A second-degree burn usually heals in 3 to 4 weeks. A minor second-degree burn usually does not leave a scar. Deeper second-degree burns may lead to scarring of the skin or contractures over joints. Contractures are scars that form over joints and may lead to reduced mobility at those joints. °CAUSES °· Heat (thermal) injury. This happens when skin comes in contact with something very hot. It could be a flame, a hot object, hot liquid, or steam. Most second-degree burns are thermal injuries. °· Radiation. Sunlight is one type of radiation that can burn the skin. Another type of radiation is used to heat food. Radiation is also used to treat some diseases, such as cancer. All types of radiation can burn the skin. Sunlight usually causes a first-degree burn. Radiation used for heating food or treating a disease can cause a second-degree burn. °· Electricity. Electrical burns can cause more damage under the skin than on the surface. They should always be treated as major burns. °· Chemicals. Many chemicals can burn the skin. The burn should be flushed with cool water and checked by an emergency caregiver. °SYMPTOMS °Symptoms of second-degree burns include: °· Severe pain. °· Extreme tenderness. °· Deep redness. °· Blistered skin. °· Skin that has changed color. It might look blotchy,  wet, or shiny. °· Swelling. °TREATMENT °Some second-degree burns may need to be treated in a hospital. These include major burns, electrical burns, and chemical burns. Many other second-degree burns can be treated with regular first aid, such as: °· Cooling the burn. Use cool, germ-free (sterile) salt water. Place the burned area of skin into a tub of water, or cover the burned area with clean, wet towels. °· Taking pain medicine. °· Removing the dead skin from broken blisters. A trained caregiver may do this. Do not pop blisters. °· Gently washing your skin with mild soap. °· Covering the burned area with a cream. Silver sulfadiazine is a cream for burns. An antibiotic cream, such as bacitracin, may also be used to fight infection. Do not use other ointments or creams unless your caregiver says it is okay. °· Protecting the burn with a sterile, non-sticky bandage. °· Bandaging fingers and toes separately. This keeps them from sticking together. °· Taking an antibiotic. This can help prevent infection. °· Getting a tetanus shot. °HOME CARE INSTRUCTIONS °Medication °· Take any medicine prescribed by your caregiver. Follow the directions carefully. °· Ask your caregiver if you can take over-the-counter medicine to relieve pain and swelling. Do not give aspirin to children. °· Make sure your caregiver knows about all other medicines you take. This includes over-the-counter medicines. °Burn care °· You will need to change the bandage on your burn. You may need to do this 2 or 3 times each day. °¨ Gently clean the burned area. °¨ Put ointment on it. °¨ Cover the burn with a sterile bandage. °·   For some deeper burns or burns that cover a large area, compression garments may be prescribed. These garments can help minimize scarring and protect your mobility. °· Do not put butter or oil on your skin. Use only the cream prescribed by your caregiver. °· Do not put ice on your burn. °· Do not break blisters on your  skin. °· Keep the bandaged area dry. You might need to take a sponge bath for awhile. Ask your caregiver when you can take a shower or a tub bath again. °· Do not scratch an itchy burn. Your caregiver may give you medicine to relieve very bad itching. °· Infection is a big danger after a second-degree burn. Tell your caregiver right away if you have signs of infection, such as: °¨ Redness or changing color in the burned area. °¨ Fluid leaking from the burn. °¨ Swelling in the burn area. °¨ A bad smell coming from the wound. °Follow-up °· Keep all follow-up appointments. This is important. This is how your caregiver can tell if your treatment is working. °· Protect your burn from sunlight. Use sunscreen whenever you go outside. Burned areas may be sensitive to the sun for up to 1 year. Exposure to the sun may also cause permanent darkening of scars. °SEEK MEDICAL CARE IF: °· You have any questions about medicines. °· You have any questions about your treatment. °· You wonder if it is okay to do a particular activity. °· You develop a fever of more than 100.5° F (38.1° C). °SEEK IMMEDIATE MEDICAL CARE IF: °· You think your burn might be infected. It may change color, become red, leak fluid, swell, or smell bad. °· You develop a fever of more than 102° F (38.9° C). °  °This information is not intended to replace advice given to you by your health care provider. Make sure you discuss any questions you have with your health care provider. °  °Document Released: 10/08/2010 Document Revised: 07/29/2011 Document Reviewed: 10/08/2010 °Elsevier Interactive Patient Education ©2016 [employer] ° °
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
## 2015-07-13 ENCOUNTER — Encounter: Payer: Self-pay | Admitting: Vascular Surgery

## 2015-07-14 ENCOUNTER — Encounter: Payer: Self-pay | Admitting: Internal Medicine

## 2015-07-14 ENCOUNTER — Ambulatory Visit (INDEPENDENT_AMBULATORY_CARE_PROVIDER_SITE_OTHER): Payer: Medicaid Other | Admitting: Internal Medicine

## 2015-07-14 VITALS — BP 146/80 | HR 98 | Temp 98.4°F | Ht 65.0 in | Wt 172.1 lb

## 2015-07-14 DIAGNOSIS — F329 Major depressive disorder, single episode, unspecified: Secondary | ICD-10-CM | POA: Diagnosis not present

## 2015-07-14 DIAGNOSIS — F32A Depression, unspecified: Secondary | ICD-10-CM | POA: Insufficient documentation

## 2015-07-14 NOTE — Progress Notes (Signed)
Dr. Juleen China requested a [place].   Assessment / Plan / Recommendations:Patient reported to PCP that she would like to set up an IC appointment for the near future. [facility] met with patient briefly and explained the role of IC here. Patient requested to set up appointment with [facility] rather than receive a community referral. Scheduled to follow up with patient next week.

## 2015-07-14 NOTE — Patient Instructions (Signed)
Please return for a PAP smear and a well woman exam. Please keep your appointment with behavioral health at this clinic.   Take Care,   Dr. Earlene Plater
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
## 2015-07-14 NOTE — Assessment & Plan Note (Addendum)
Currently taking Zoloft 50 mg daily and Klonopin 1 mg nightly. Given PHQ-2 score of 0, seems well controlled on current medication regimen. Patient wishes to transfer care to here for management of these medications. Discussed that I would need to see the records from past psych providers regarding her mental health diagnoses, before committing to care of these medical conditions.  -continue medications through psych in [facility] for now  -behavioral health at [facility] appointment established for counseling and further evaluation  -will likely make referral to psych especially if patient has confirmed diagnosis of bipolar disorder

## 2015-07-17 ENCOUNTER — Ambulatory Visit (INDEPENDENT_AMBULATORY_CARE_PROVIDER_SITE_OTHER): Payer: Medicaid Other | Admitting: *Deleted

## 2015-07-17 DIAGNOSIS — I429 Cardiomyopathy, unspecified: Secondary | ICD-10-CM | POA: Diagnosis not present

## 2015-07-17 DIAGNOSIS — I428 Other cardiomyopathies: Secondary | ICD-10-CM

## 2015-07-17 NOTE — Progress Notes (Signed)
Remote ICD transmission.   

## 2015-07-19 ENCOUNTER — Ambulatory Visit (HOSPITAL_COMMUNITY)
Admission: RE | Admit: 2015-07-19 | Discharge: 2015-07-19 | Disposition: A | Discharge status: Home / Self Care | Payer: Medicaid Other | Source: Ambulatory Visit | Attending: Vascular Surgery | Admitting: Vascular Surgery

## 2015-07-19 ENCOUNTER — Encounter: Payer: Self-pay | Admitting: Vascular Surgery

## 2015-07-19 ENCOUNTER — Ambulatory Visit (INDEPENDENT_AMBULATORY_CARE_PROVIDER_SITE_OTHER): Payer: Medicaid Other | Admitting: Vascular Surgery

## 2015-07-19 VITALS — BP 113/68 | HR 81 | Temp 99.0°F | Resp 16 | Ht 65.0 in | Wt 172.0 lb

## 2015-07-19 DIAGNOSIS — I70209 Unspecified atherosclerosis of native arteries of extremities, unspecified extremity: Secondary | ICD-10-CM

## 2015-07-19 DIAGNOSIS — R0989 Other specified symptoms and signs involving the circulatory and respiratory systems: Secondary | ICD-10-CM | POA: Insufficient documentation

## 2015-07-19 DIAGNOSIS — Z48812 Encounter for surgical aftercare following surgery on the circulatory system: Secondary | ICD-10-CM | POA: Diagnosis not present

## 2015-07-19 DIAGNOSIS — R938 Abnormal findings on diagnostic imaging of other specified body structures: Secondary | ICD-10-CM | POA: Insufficient documentation
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
## 2015-07-19 NOTE — Addendum Note (Signed)
Addended by: Adria Dill L on: 07/19/2015 02:49 PM   Modules accepted: Orders

## 2015-07-19 NOTE — Addendum Note (Signed)
Addended by: Adria Dill L on: 07/19/2015 02:20 PM   Modules accepted: Orders

## 2015-07-21 ENCOUNTER — Ambulatory Visit: Payer: Self-pay

## 2015-07-28 ENCOUNTER — Encounter: Payer: Self-pay | Admitting: Neurology

## 2015-07-28 ENCOUNTER — Ambulatory Visit (INDEPENDENT_AMBULATORY_CARE_PROVIDER_SITE_OTHER): Payer: Medicaid Other | Admitting: Neurology

## 2015-07-28 VITALS — BP 118/80 | HR 77 | Ht 65.0 in | Wt 182.4 lb

## 2015-07-28 DIAGNOSIS — G63 Polyneuropathy in diseases classified elsewhere: Secondary | ICD-10-CM | POA: Diagnosis not present

## 2015-07-28 DIAGNOSIS — Z720 Tobacco use: Secondary | ICD-10-CM

## 2015-07-28 DIAGNOSIS — I739 Peripheral vascular disease, unspecified: Secondary | ICD-10-CM

## 2015-07-28 MED ORDER — GABAPENTIN 300 MG PO CAPS: 300.0000 mg | ORAL_CAPSULE | Freq: Three times a day (TID) | ORAL | Status: DC

## 2015-07-28 NOTE — Progress Notes (Signed)
Follow-up Visit   Date: 07/28/2015    Diana Moreno MRN: [id number] DOB: 09-11-1975   Interim History: Diana Moreno is a 40 y.o. right-handed Caucasian female with history of bipolar disease, ischemic cardiomyopathy (EF 25-30%), aortic occlusion s/p surgery complicated by peripheral neuropathy returning to the clinic for follow-up of neuropathy.  The patient was accompanied to the clinic by friend who also provides collateral information.    History of present illness: Patient was in her usual state of health until March 2014 when she developed subacute onset of back pain and bilateral foot paresthesias. She went to the emergency department for evaluation where CT scan demonstrated aortic occlusion. She underwent exploratory laparotomy, aortobifemoral bypass grafting, bilateral femoral embolectomies and bilateral intraoperative arteriograms. Hypercoagulable panel was negative. Coumadin was recommended for at least 6-12 mos. Echo 08/07/12: EF 30-35%, inferior, posterior, apical HK and mid to distal anterior AK, moderate MR, moderate LAE, density attached to the LV inferior wall-question clot. TEE 08/07/12 confirmed a mobile mass along the inferoposterior wall consistent with thrombus. Given her wall motion abnormalities, there was concern for ischemic cardiomyopathy and underlying CAD. Underlying etiology for her vascular occlusive disease remains uncertain.  Since this, she has continued to have numbness/tingling of the feet, lower legs, and groin. Her symptoms have stabilized over the past year. She has tried Lyrica which seemed to work but due to financial reasons, she is no longer taking it. She has a history of bipolar disease and has been on Neurontin 600 mg 3 times daily several years ago but not recently. She is currently taking amitriptyline 50-100 mg at bedtime without any significant benefit.  UPDATE 07/28/2015: She was lost to follow-up and last seen in 2015 because she was  homeless and trying to get on to disability last year.  She recently was approved for medicaid and was recommended to re-establish care with neurology because her paresthesias had worsened off medication.  She was hospitalized for viral illness in January and started on gabapentin 100mg  three times daily, which alleviates some of her tingling. Her paresthesias involve her entire lower legs.  She sometimes has random falls and feels that her legs give out especially when on uneven surfaces.  She falls about once every 2-3 months.    Medications:  Current Outpatient Prescriptions on File Prior to Visit  Medication Sig Dispense Refill  . acetaminophen (TYLENOL) 500 MG tablet Take 1,000 mg by mouth every 6 (six) hours as needed for pain or fever.    Marland Kitchen atorvastatin (LIPITOR) 40 MG tablet Take 1 tablet (40 mg total) by mouth daily at 6 PM. 90 tablet 3  . carvedilol (COREG) 3.125 MG tablet Take 1 tablet (3.125 mg total) by mouth 2 (two) times daily with a meal. 180 tablet 3  . clonazePAM (KLONOPIN) 1 MG tablet Take 1 mg by mouth 2 (two) times daily.    Marland Kitchen gabapentin (NEURONTIN) 100 MG capsule Take 1 capsule (100 mg total) by mouth 3 (three) times daily. 90 capsule 0  . lisinopril (PRINIVIL,ZESTRIL) 2.5 MG tablet Take 1 tablet (2.5 mg total) by mouth at bedtime. 30 tablet 11  . mupirocin nasal ointment (BACTROBAN) 2 % Apply to hand bid 20 g 2  . sertraline (ZOLOFT) 50 MG tablet Take 50 mg by mouth daily.    . traMADol (ULTRAM) 50 MG tablet Take 1 tablet (50 mg total) by mouth every 6 (six) hours as needed for moderate pain. 60 tablet 0   No current facility-administered medications on file  prior to visit.    Allergies:  Allergies  Allergen Reactions  . Sulfa Antibiotics Hives  . Sulfur Itching    Actually a Sulfonamide antibiotic allergy with Hives reaction     Review of Systems:  CONSTITUTIONAL: No fevers, chills, night sweats, or weight loss.  EYES: No visual changes or eye pain ENT: No hearing  changes.  No history of nose bleeds.   RESPIRATORY: No cough, wheezing and shortness of breath.   CARDIOVASCULAR: Negative for chest pain, and palpitations.   GI: Negative for abdominal discomfort, blood in stools or black stools.  No recent change in bowel habits.   GU:  No history of incontinence.   MUSCLOSKELETAL: No history of joint pain or swelling.  No myalgias.   SKIN: Negative for lesions, rash, and itching.   ENDOCRINE: Negative for cold or heat intolerance, polydipsia or goiter.   PSYCH:  + depression or anxiety symptoms.   NEURO: As Above.   Vital Signs:  BP 118/80 mmHg  Pulse 77  Ht 5\' 5"  (1.651 m)  Wt 182 lb 7 oz (82.753 kg)  BMI 30.36 kg/m2  SpO2 98%  LMP 07/02/2015 (Exact Date)   General: Mildly disheveled, strong tobacco odor CV: RRR Ext: No edema  Neurological Exam: MENTAL STATUS including orientation to time, place, person, recent and remote memory, attention span and concentration, language, and fund of knowledge is normal. She has pressured speech.  Speech is not dysarthric.  CRANIAL NERVES: No visual field defects.  Pupils equal round and reactive to light.  Normal conjugate, extra-ocular eye movements in all directions of gaze.  No ptosis. Normal facial sensation.  Face is symmetric. Palate elevates symmetrically.  Tongue is midline.  MOTOR:  Motor strength is 5/5 in all extremities, except trace weakness of toe extension and weakness.  No atrophy, fasciculations or abnormal movements.  No pronator drift.  Tone is normal.    MSRs:  Reflexes are 2+/4 throughout, except absent Achilles bilaterally.  SENSORY: Pin prick reduced distal to mild calf, vibration 30% at ankles and absent at great toe.  COORDINATION/GAIT:  Normal finger-to- nose-finger. Intact rapid alternating movements bilaterally.  Gait narrow based and stable. She has difficulty with heel and tandem gait.  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 There is a resulting occlusion of the common iliac arteries bilaterally. There is also thrombus in the left external iliac and bilateral internal iliac arteries. The  proximal right external iliac and distal left external iliac arteries reconstitute. Internal iliac artery branches reconstitute.  Findings worrisome for bilateral renal infarcts, supporting the diagnosis of embolic phenomenon.  There is thrombus at the origin of the IMA. Beyond its origin, it reconstitutes.  Remainder of the visceral vasculature is grossly patent.   MRI lumbar spine 08/01/2012: Slight degenerative disc disease in the lower lumbar spine with no neural impingement. No findings to explain the patient's right foot drop.  MRI thoracic spine 08/01/2012: Normal MRI of the thoracic spine.  Labs 07/2012: Cardiolipin antibody negative, CRP 7.8, factor V leiden - negative, hemoglobin A1c 5.4, homocystine 8.6, lupus anticoagulant - neg, TSH 2.9, protein C 87, protein S 103  Echo 04/30/2013: - Left ventricle: The cavity size was mildly dilated. Wall thickness was normal. Systolic function was severely reduced. The estimated ejection fraction was in the range of 25% to 30%. Diffuse hypokinesis. There is akinesis of the inferior, posterior and inferoseptal myocardium. Doppler parameters are consistent  with abnormal left ventricular relaxation (grade 1 diastolic dysfunction). - Mitral valve: Mild regurgitation. - Left atrium: The atrium was mildly dilated. Impressions: - Compared to study dated 08/07/12, LV function slightly worse and MR appears to be mild.   IMPRESSION/PLAN: 1.  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  Tobacco use disorder  - Smoking cessation instruction/counseling given:  counseled patient on the dangers of tobacco use, advised patient to stop smoking, and reviewed strategies to maximize success  Return to clinic in 6 months   The duration of this appointment visit was 30 minutes of face-to-face time with the patient.  Greater than 50% of this time was spent in counseling, explanation of diagnosis, planning of further management, and coordination of care.   Thank you for allowing me to participate in patient's care.  If I can answer any additional questions, I would be pleased to do so.    Sincerely,    Detrice Cales K. Allena Katz, DO

## 2015-07-28 NOTE — Patient Instructions (Addendum)
Gabapentin 300 mg tablets    Morning       Afternoon        Evening  Week 1                                  1 tab              Week 2 1 tab                   1 tab              Continue  1 tab          1 tab            1 tab         Call with update in 1 month, to determine further increase in medication.  If you develop increased sleepiness, stay at the lower dose.           Advised to quit smoking!          Return to clinic in 6 months

## 2015-08-01 ENCOUNTER — Other Ambulatory Visit (HOSPITAL_COMMUNITY)
Admission: RE | Admit: 2015-08-01 | Discharge: 2015-08-01 | Disposition: A | Discharge status: Home / Self Care | Payer: Medicaid Other | Source: Ambulatory Visit | Attending: Family Medicine | Admitting: Family Medicine

## 2015-08-01 ENCOUNTER — Ambulatory Visit (INDEPENDENT_AMBULATORY_CARE_PROVIDER_SITE_OTHER): Payer: Medicaid Other | Admitting: Internal Medicine

## 2015-08-01 ENCOUNTER — Encounter: Payer: Self-pay | Admitting: Internal Medicine

## 2015-08-01 VITALS — BP 128/84 | HR 98 | Temp 98.0°F | Ht 65.0 in | Wt 176.6 lb

## 2015-08-01 DIAGNOSIS — Z720 Tobacco use: Secondary | ICD-10-CM | POA: Diagnosis not present

## 2015-08-01 DIAGNOSIS — Z113 Encounter for screening for infections with a predominantly sexual mode of transmission: Secondary | ICD-10-CM | POA: Diagnosis present

## 2015-08-01 DIAGNOSIS — F329 Major depressive disorder, single episode, unspecified: Secondary | ICD-10-CM | POA: Diagnosis not present

## 2015-08-01 DIAGNOSIS — E785 Hyperlipidemia, unspecified: Secondary | ICD-10-CM | POA: Diagnosis not present

## 2015-08-01 DIAGNOSIS — Z01411 Encounter for gynecological examination (general) (routine) with abnormal findings: Secondary | ICD-10-CM | POA: Insufficient documentation

## 2015-08-01 DIAGNOSIS — Z1151 Encounter for screening for human papillomavirus (HPV): Secondary | ICD-10-CM | POA: Insufficient documentation

## 2015-08-01 DIAGNOSIS — Z124 Encounter for screening for malignant neoplasm of cervix: Secondary | ICD-10-CM | POA: Diagnosis not present

## 2015-08-01 DIAGNOSIS — B360 Pityriasis versicolor: Secondary | ICD-10-CM

## 2015-08-01 DIAGNOSIS — F32A Depression, unspecified: Secondary | ICD-10-CM

## 2015-08-01 MED ORDER — KETOCONAZOLE 2 % EX CREA: 1.0000 "application " | TOPICAL_CREAM | Freq: Every day | CUTANEOUS | Status: DC

## 2015-08-01 MED ORDER — SERTRALINE HCL 50 MG PO TABS: 50.0000 mg | ORAL_TABLET | Freq: Every day | ORAL | Status: DC

## 2015-08-01 NOTE — Assessment & Plan Note (Signed)
Skin hypopigmentation appears consistent with Tinea Versicolor.  -Rx for ketoconazole cream given, apply daily x 2 weeks -if does not resolve, should return and will likely need KOH scraping

## 2015-08-01 NOTE — Assessment & Plan Note (Signed)
Patient reports that she is ready to quit smoking. States that given her medical history she "is wasting everyone's time if continues to smoke". Currently smoking 1 ppd.  -patient to schedule with Dr. Raymondo Band (pharamacy) for tobacco cessation therapy

## 2015-08-01 NOTE — Assessment & Plan Note (Addendum)
Discussed with patient again that I need her records from psych in [facility]. She cancelled her last appointment with Behavioral Health due to transportation issues. Patient spent most of visit discussing past experiences and medical complaints. She brought up that she has been diagnosed with Bipolar in the past and has taken mood stabilizing and anti-psychotic medications. Reports that she has been on the Zoloft and Klonopin for a few years. It was difficult to obtain a clear history from her and her explanations to answers were often long winded. I do have some concern that she does not merely have depression and anxiety given her current mood, reported symptoms, and past history.  -patient to obtain records from previous psych provider  -refilled Zoloft once, will not refill again until records obtained  -again instructed her to make an appointment with behavioral health at our clinic

## 2015-08-01 NOTE — Progress Notes (Signed)
Date of Visit: 08/01/2015   HPI:  Patient presents today for a well woman exam.   Concerns today: Rash on back  Periods: LMP: 07/22/15, regular  Contraception: Abstinence  Pelvic symptoms: None  Sexual activity: Not currently sexually active. Active in 2016 in long-term relationship.  STD Screening: History of genital warts  Pap smear status: 2013, no history of abnormal paps  Exercise: Walks daily.  Diet: Low sodium diet. All things in moderation.  Smoking: Currently smoking 1 ppd.  Alcohol: Drinks about 2-3 glasses of wine per month. At social events.  Drugs: Smokes marijuana about once per month. No other illicit drugs.   ROS: See HPI  PMFSH:  Cancers in family: Grandfather: skin cancer (unknown type), prostate cancer    PHYSICAL EXAM: BP 128/84 mmHg  Pulse 98  Temp(Src) 98 F (36.7 C) (Oral)  Wt 176 lb 9.6 oz (80.105 kg)  LMP 07/02/2015 (Exact Date) Gen: NAD, pleasant, cooperative HEENT: NCAT, PERRL, no palpable thyromegaly or anterior cervical lymphadenopathy Heart: RRR, no murmurs Lungs: CTAB, NWOB Abdomen: soft, nontender to palpation Neuro: grossly nonfocal, speech normal GU: normal appearing external genitalia without lesions. Vagina is moist with no discharge. Cervix normal in appearance. No cervical motion tenderness or tenderness on bimanual exam. No adnexal masses.  Psych: Pressured, fast speech. Tangential thought process.  Skin: Several hypopigmented plaques present on upper/middle back.   ASSESSMENT/PLAN:  Depression Discussed with patient again that I need her records from psych in [facility]. She cancelled her last appointment with Behavioral Health due to transportation issues. Patient spent most of visit discussing past experiences and medical complaints. She brought up that she has been diagnosed with Bipolar in the past and has taken mood stabilizing and anti-psychotic medications. Reports that she has been on the Zoloft and Klonopin for a few years.  It was difficult to obtain a clear history from her and her explanations to answers were often long winded. I do have some concern that she does not merely have depression and anxiety given her current mood, reported symptoms, and past history.  -patient to obtain records from previous psych provider  -refilled Zoloft once, will not refill again until records obtained  -again instructed her to make an appointment with behavioral health at our clinic    Tobacco abuse Patient reports that she is ready to quit smoking. States that given her medical history she "is wasting everyone's time if continues to smoke". Currently smoking 1 ppd.  -patient to schedule with Dr. Raymondo Band (pharamacy) for tobacco cessation therapy  Screening for malignant neoplasm of cervix Pap with high-risk HPV testing performed today. Will also obtain GC/Chlamydia off of PAP.  Tinea versicolor Skin hypopigmentation appears consistent with Tinea Versicolor.  -Rx for ketoconazole cream given, apply daily x 2 weeks -if does not resolve, should return and will likely need KOH scraping     Patient to return for lab visit for lipid panel and HIV.   Marcy Siren, D.O. 08/01/2015, 4:11 PM PGY-1, [facility]

## 2015-08-01 NOTE — Patient Instructions (Addendum)
Thank you for coming to see me today. It was a pleasure. Today we talked about:   Make a lab appointment for lipid panel and HIV.   Make an appointment with Dr. Raymondo Band for smoking cessation.   For your fungus on your back I have prescribed ketaconazole cream. Apply this daily for 2 weeks. Please return if this does not improve.   Make an appointment with behavioral health as you leave today. Try to get the records from psychiatry.   Please follow-up with Dr. Earlene Plater in 3 months.   If you have any questions or concerns, please do not hesitate to call the office at [phone].  Take Care,   Marcy Siren, DOi

## 2015-08-01 NOTE — Assessment & Plan Note (Signed)
Pap with high-risk HPV testing performed today. Will also obtain GC/Chlamydia off of PAP.

## 2015-08-03 ENCOUNTER — Other Ambulatory Visit: Payer: Self-pay

## 2015-08-04 LAB — CYTOLOGY - PAP

## 2015-08-11 ENCOUNTER — Ambulatory Visit: Payer: Self-pay | Admitting: Pharmacist

## 2015-08-11 ENCOUNTER — Ambulatory Visit: Payer: Self-pay

## 2015-08-11 ENCOUNTER — Other Ambulatory Visit: Payer: Self-pay

## 2015-08-11 ENCOUNTER — Encounter: Payer: Self-pay | Admitting: Cardiology

## 2015-08-11 LAB — CUP PACEART REMOTE DEVICE CHECK
Battery Remaining Percentage: 100 %
Brady Statistic RV Percent Paced: 0 %
HIGH POWER IMPEDANCE MEASURED VALUE: 79 Ohm
Implantable Lead Serial Number: 341088
Lead Channel Impedance Value: 1227 Ohm
Lead Channel Pacing Threshold Amplitude: 2.4 V
Lead Channel Setting Pacing Pulse Width: 0.8 ms
MDC IDC LEAD IMPLANT DT: 20150514
MDC IDC LEAD LOCATION: 753860
MDC IDC LEAD MODEL: 292
MDC IDC MSMT BATTERY REMAINING LONGEVITY: 138 mo
MDC IDC MSMT LEADCHNL RV PACING THRESHOLD PULSEWIDTH: 1.5 ms
MDC IDC SESS DTM: 20170227121100
MDC IDC SET LEADCHNL RV PACING AMPLITUDE: 4 V
MDC IDC SET LEADCHNL RV SENSING SENSITIVITY: 0.6 mV
Pulse Gen Serial Number: 115386

## 2015-08-14 ENCOUNTER — Telehealth: Payer: Self-pay | Admitting: Internal Medicine

## 2015-08-14 NOTE — Telephone Encounter (Signed)
Called to discuss PAP results with patient. Asked patient for return call. Given HPV high risk present on PAP will need to be scheduled in colposcopy clinic.   Marcy Siren, D.O. 08/14/2015, 4:40 PM PGY-1, [facility]

## 2015-08-21 ENCOUNTER — Telehealth: Payer: Self-pay | Admitting: Internal Medicine

## 2015-08-21 NOTE — Telephone Encounter (Signed)
-----   Message from Nestor Ramp, MD sent at 08/08/2015 10:17 AM EDT ----- Regarding: RE: Colposcopy?  Yes schedule her in colpo clinic ----- Message -----    From: Arvilla Market, DO    Sent: 08/07/2015   2:46 PM      To: Nestor Ramp, MD Subject: Colposcopy?                                    Hi Dr. Jennette Kettle,   I performed this patient's pap last week. 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 The HPV was high risk. Given her age and this finding, would she be appropriate for colpo clinic? I was reviewing the guidelines with Dr. Deirdre Priest but we wanted to confirm this with you before scheduling.   Thanks,   Cat    ----- Message -----    From: Lab in [facility]    Sent: 08/07/2015   8:54 AM      To: Arvilla Market, DO

## 2015-08-21 NOTE — Telephone Encounter (Signed)
Called patient to discuss PAP results. She is busy trying to find apartment and is currently driving. Will call the clinic back as soon as possible to discuss pap results of high risk HPV + and need for colposcopy to be scheduled.   Marcy Siren, D.O. 08/21/2015, 1:50 PM PGY-1, [facility]

## 2015-08-25 ENCOUNTER — Encounter: Payer: Self-pay | Admitting: Cardiology

## 2015-09-04 ENCOUNTER — Telehealth: Payer: Self-pay | Admitting: Internal Medicine

## 2015-09-04 NOTE — Telephone Encounter (Signed)
FYI:   The pt states that a provider advised her in the past that she can take a small dose of Melatonin at night to help her sleep.  She states that she took Melatonin 3 mg last night at about 12 am and woke up this am at 9:00 "feeling funky"and was dizzy and nauseous.   She states that she has been under a lot of stress since her surgery and that now she is staying with a friend (sleeping on their couch) due to an argument with her roommate. She also reports that she ate roast beef last night before going to bed which is unusual for her and wonders if that is why she felt so bad.  She states that once she called [place] this morning that she ate a [place] sandwich with pretzel sticks and that she is feeling much better at this time and apologized for panicking and calling us before she ate.  She is advised that it is ok to call us with questions and/or concerns and that I am forwarding this message to both Dr Graciela Husbands and Dr Tenny Craw as Lorain Childes. She verbalized understanding and thanked me for calling her back and talking with her.

## 2015-09-04 NOTE — Telephone Encounter (Signed)
New message    Patient calling C/O could not sleep last night  took a melatonin 3 mg .     Pt C/O BP issue: STAT if pt C/O blurred vision, one-sided weakness or slurred speech  1. What are your last 5 BP readings? No - machine at home   2. Are you having any other symptoms (ex. Dizziness, headache, blurred vision, passed out)? Dizziness   3. What is your BP issue? Took blood pressure carvedilol  this am @ 8:30 am  / last night about 9 pm lisinopril & carvedilol.    Marland Kitchen

## 2015-09-06 ENCOUNTER — Telehealth: Payer: Self-pay | Admitting: *Deleted

## 2015-09-06 NOTE — Telephone Encounter (Signed)
LVM to call office back to inform pt of below. Zimmerman Rumple, Elyana Grabski D, CMA  

## 2015-09-06 NOTE — Telephone Encounter (Signed)
-----   Message from Arvilla Market, DO sent at 09/01/2015 10:18 AM EDT ----- I have attempted to contact patient a few times. Please call and let her know that she needs to be scheduled in colposcopy clinic due to +high risk HPV on recent pap smear.

## 2015-10-17 ENCOUNTER — Ambulatory Visit (INDEPENDENT_AMBULATORY_CARE_PROVIDER_SITE_OTHER): Payer: Medicaid Other | Admitting: *Deleted

## 2015-10-17 DIAGNOSIS — I428 Other cardiomyopathies: Secondary | ICD-10-CM

## 2015-10-17 DIAGNOSIS — I429 Cardiomyopathy, unspecified: Secondary | ICD-10-CM

## 2015-10-18 ENCOUNTER — Telehealth: Payer: Self-pay | Admitting: *Deleted

## 2015-10-18 NOTE — Progress Notes (Signed)
Remote ICD transmission.   

## 2015-10-18 NOTE — Telephone Encounter (Signed)
LVM for pt to call back to see about scheduling her in Colpo clinic per previous phone message. Lamonte Sakai, April D, [place]

## 2015-11-02 LAB — CUP PACEART REMOTE DEVICE CHECK
Battery Remaining Percentage: 100 %
Brady Statistic RV Percent Paced: 0 %
Date Time Interrogation Session: 20170530083200
HIGH POWER IMPEDANCE MEASURED VALUE: 89 Ohm
Lead Channel Impedance Value: 1230 Ohm
Lead Channel Pacing Threshold Amplitude: 2.4 V
Lead Channel Setting Pacing Amplitude: 4 V
Lead Channel Setting Sensing Sensitivity: 0.6 mV
MDC IDC LEAD IMPLANT DT: 20150514
MDC IDC LEAD LOCATION: 753860
MDC IDC LEAD MODEL: 292
MDC IDC LEAD SERIAL: 341088
MDC IDC MSMT BATTERY REMAINING LONGEVITY: 132 mo
MDC IDC MSMT LEADCHNL RV PACING THRESHOLD PULSEWIDTH: 1.5 ms
MDC IDC SET LEADCHNL RV PACING PULSEWIDTH: 0.8 ms
Pulse Gen Serial Number: 115386

## 2015-11-07 ENCOUNTER — Encounter: Payer: Self-pay | Admitting: Cardiology

## 2015-11-23 ENCOUNTER — Encounter: Payer: Self-pay | Admitting: Cardiology

## 2015-12-26 ENCOUNTER — Encounter: Payer: Self-pay | Admitting: Internal Medicine

## 2016-01-29 ENCOUNTER — Ambulatory Visit: Payer: Self-pay | Admitting: Neurology

## 2016-02-26 ENCOUNTER — Encounter: Payer: Self-pay | Admitting: Internal Medicine

## 2016-02-29 ENCOUNTER — Encounter: Payer: Self-pay | Admitting: Internal Medicine
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
## 2016-03-01 ENCOUNTER — Encounter: Payer: Self-pay | Admitting: Internal Medicine

## 2016-03-07 ENCOUNTER — Ambulatory Visit: Payer: Self-pay | Admitting: Internal Medicine

## 2016-03-08 ENCOUNTER — Encounter: Payer: Self-pay | Admitting: Internal Medicine

## 2016-05-15 ENCOUNTER — Telehealth: Payer: Self-pay | Admitting: Internal Medicine

## 2016-05-15 DIAGNOSIS — I420 Dilated cardiomyopathy: Secondary | ICD-10-CM

## 2016-05-15 MED ORDER — CARVEDILOL 3.125 MG PO TABS: 3.1250 mg | ORAL_TABLET | Freq: Two times a day (BID) | ORAL | 0 refills | Status: DC

## 2016-05-15 NOTE — Telephone Encounter (Signed)
New message       *STAT* If patient is at the pharmacy, call can be transferred to refill team.   1. Which medications need to be refilled? (please list name of each medication and dose if known) coreg 3.125mg   2. Which pharmacy/location (including street and city if local pharmacy) is medication to be sent to? [facility] [address] 3. Do they need a 30 day or 90 day supply? 90 day

## 2016-06-14 ENCOUNTER — Ambulatory Visit: Payer: Medicaid Other | Admitting: Internal Medicine

## 2016-06-18 ENCOUNTER — Encounter: Payer: Self-pay | Admitting: Internal Medicine

## 2016-06-18 ENCOUNTER — Ambulatory Visit (INDEPENDENT_AMBULATORY_CARE_PROVIDER_SITE_OTHER): Payer: Medicaid Other | Admitting: Internal Medicine

## 2016-06-18 VITALS — BP 124/84 | HR 84 | Ht 65.0 in | Wt 179.0 lb

## 2016-06-18 DIAGNOSIS — I428 Other cardiomyopathies: Secondary | ICD-10-CM

## 2016-06-18 DIAGNOSIS — E782 Mixed hyperlipidemia: Secondary | ICD-10-CM

## 2016-06-18 DIAGNOSIS — E282 Polycystic ovarian syndrome: Secondary | ICD-10-CM

## 2016-06-18 DIAGNOSIS — I5022 Chronic systolic (congestive) heart failure: Secondary | ICD-10-CM | POA: Diagnosis not present

## 2016-06-18 DIAGNOSIS — I255 Ischemic cardiomyopathy: Secondary | ICD-10-CM

## 2016-06-18 DIAGNOSIS — Z95810 Presence of automatic (implantable) cardiac defibrillator: Secondary | ICD-10-CM | POA: Diagnosis not present

## 2016-06-18 LAB — CUP PACEART INCLINIC DEVICE CHECK
Brady Statistic RV Percent Paced: 1 % — CL
HighPow Impedance: 82 Ohm
Implantable Lead Location: 753860
Implantable Lead Serial Number: 341088
Lead Channel Pacing Threshold Amplitude: 2.8 V
Lead Channel Setting Pacing Amplitude: 4 V
Lead Channel Setting Pacing Pulse Width: 0.8 ms
Lead Channel Setting Sensing Sensitivity: 0.6 mV
MDC IDC LEAD IMPLANT DT: 20150514
MDC IDC MSMT LEADCHNL RV IMPEDANCE VALUE: 1228 Ohm
MDC IDC MSMT LEADCHNL RV PACING THRESHOLD PULSEWIDTH: 0.8 ms
MDC IDC MSMT LEADCHNL RV SENSING INTR AMPL: 22.4 mV
MDC IDC PG IMPLANT DT: 20150514
MDC IDC SESS DTM: 20180130050000
Pulse Gen Serial Number: 115386
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
## 2016-06-18 NOTE — Patient Instructions (Addendum)
Medication Instructions: - Your physician recommends that you continue on your current medications as directed. Please refer to the Current Medication list given to you today  Labwork: - Your physician recommends that you return for lab work : the day of your echo: BMP/ lipid/ HgbA1C  Procedures/Testing: - Your physician has requested that you have an echocardiogram. Echocardiography is a painless test that uses sound waves to create images of your heart. It provides your doctor with information about the size and shape of your heart and how well your heart's chambers and valves are working. This procedure takes approximately one hour. There are no restrictions for this procedure.  Follow-Up: - Remote monitoring is used to monitor your Pacemaker of ICD from home. This monitoring reduces the number of office visits required to check your device to one time per year. It allows [place] to keep an eye on the functioning of your device to ensure it is working properly. You are scheduled for a device check from home on 09/17/16. You may send your transmission at any time that day. If you have a wireless device, the transmission will be sent automatically. After your physician reviews your transmission, you will receive a postcard with your next transmission date.  - Your physician wants you to follow-up in: 9 months with Diana Balsam, Diana Moreno for Dr. Graciela Husbands. You will receive a reminder letter in the mail two months in advance. If you don't receive a letter, please call our office to schedule the follow-up appointment.   Any Additional Special Instructions Will Be Listed Below (If Applicable).     If you need a refill on your cardiac medications before your next appointment, please call your pharmacy.

## 2016-07-09 ENCOUNTER — Other Ambulatory Visit: Payer: Medicaid Other

## 2016-07-09 ENCOUNTER — Other Ambulatory Visit (HOSPITAL_COMMUNITY): Payer: Self-pay

## 2016-07-11 ENCOUNTER — Encounter: Payer: Self-pay | Admitting: Internal Medicine

## 2016-07-16 ENCOUNTER — Encounter: Payer: Self-pay | Admitting: Vascular Surgery
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
## 2016-07-22 ENCOUNTER — Ambulatory Visit (INDEPENDENT_AMBULATORY_CARE_PROVIDER_SITE_OTHER): Payer: Medicaid Other | Admitting: Internal Medicine

## 2016-07-22 ENCOUNTER — Encounter: Payer: Self-pay | Admitting: Internal Medicine

## 2016-07-22 VITALS — BP 126/84 | HR 99 | Ht 65.0 in | Wt 177.8 lb

## 2016-07-22 DIAGNOSIS — I429 Cardiomyopathy, unspecified: Secondary | ICD-10-CM | POA: Diagnosis not present

## 2016-07-22 NOTE — Patient Instructions (Signed)
Your physician recommends that you continue on your current medications as directed. Please refer to the Current Medication list given to you today.  Your physician wants you to follow-up in: Oct 2018 with Dr. Tenny Craw.  You will receive a reminder letter in the mail two months in advance. If you don't receive a letter, please call our office to schedule the follow-up appointment.

## 2016-07-23 ENCOUNTER — Encounter (HOSPITAL_COMMUNITY): Payer: Self-pay

## 2016-07-24 ENCOUNTER — Ambulatory Visit: Payer: Medicaid Other | Admitting: Vascular Surgery

## 2016-07-24 ENCOUNTER — Inpatient Hospital Stay (HOSPITAL_COMMUNITY): Admission: RE | Admit: 2016-07-24 | Payer: Self-pay | Source: Ambulatory Visit

## 2016-07-25 ENCOUNTER — Other Ambulatory Visit: Payer: Medicaid Other | Admitting: *Deleted

## 2016-07-25 ENCOUNTER — Other Ambulatory Visit (HOSPITAL_COMMUNITY): Payer: Self-pay

## 2016-07-25 ENCOUNTER — Telehealth (HOSPITAL_COMMUNITY): Payer: Self-pay | Admitting: Internal Medicine

## 2016-07-25 DIAGNOSIS — I428 Other cardiomyopathies: Secondary | ICD-10-CM

## 2016-07-25 DIAGNOSIS — I429 Cardiomyopathy, unspecified: Secondary | ICD-10-CM

## 2016-07-25 DIAGNOSIS — I5022 Chronic systolic (congestive) heart failure: Secondary | ICD-10-CM

## 2016-07-25 DIAGNOSIS — E282 Polycystic ovarian syndrome: Secondary | ICD-10-CM

## 2016-07-25 DIAGNOSIS — E782 Mixed hyperlipidemia: Secondary | ICD-10-CM

## 2016-07-25 DIAGNOSIS — I255 Ischemic cardiomyopathy: Secondary | ICD-10-CM

## 2016-07-25 NOTE — Addendum Note (Signed)
Addended by: Tonita Phoenix on: 07/25/2016 03:07 PM   Modules accepted: Orders

## 2016-07-25 NOTE — Addendum Note (Signed)
Addended by: Tonita Phoenix on: 07/25/2016 03:10 PM   Modules accepted: Orders

## 2016-07-25 NOTE — Addendum Note (Signed)
Addended by: Tonita Phoenix on: 07/25/2016 03:09 PM   Modules accepted: Orders

## 2016-07-25 NOTE — Addendum Note (Signed)
Addended by: Tonita Phoenix on: 07/25/2016 03:08 PM   Modules accepted: Orders

## 2016-07-26 LAB — LIPID PANEL
Chol/HDL Ratio: 4.1 ratio units (ref 0.0–4.4)
Cholesterol, Total: 174 mg/dL (ref 100–199)
HDL: 42 mg/dL (ref >39)
LDL Calculated: 102 mg/dL — ABNORMAL HIGH (ref 0–99)
Triglycerides: 150 mg/dL — ABNORMAL HIGH (ref 0–149)
VLDL CHOLESTEROL CAL: 30 mg/dL (ref 5–40)

## 2016-07-26 LAB — BASIC METABOLIC PANEL
BUN / CREAT RATIO: 11 (ref 9–23)
BUN: 8 mg/dL (ref 6–24)
CO2: 23 mmol/L (ref 18–29)
CREATININE: 0.71 mg/dL (ref 0.57–1.00)
Calcium: 9.6 mg/dL (ref 8.7–10.2)
Chloride: 102 mmol/L (ref 96–106)
GFR calc non Af Amer: 107 mL/min/{1.73_m2} (ref >59)
GFR, EST AFRICAN AMERICAN: 123 mL/min/{1.73_m2} (ref >59)
GLUCOSE: 79 mg/dL (ref 65–99)
Potassium: 4 mmol/L (ref 3.5–5.2)
SODIUM: 142 mmol/L (ref 134–144)

## 2016-07-26 LAB — HEMOGLOBIN A1C
Est. average glucose Bld gHb Est-mCnc: 111 mg/dL
Hgb A1c MFr Bld: 5.5 % (ref 4.8–5.6)

## 2016-07-26 LAB — TSH: TSH: 1.12 u[IU]/mL (ref 0.450–4.500)

## 2016-07-29 ENCOUNTER — Other Ambulatory Visit: Payer: Self-pay | Admitting: Neurology

## 2016-07-29 ENCOUNTER — Other Ambulatory Visit: Payer: Self-pay | Admitting: Internal Medicine

## 2016-07-29 DIAGNOSIS — I420 Dilated cardiomyopathy: Secondary | ICD-10-CM

## 2016-07-30 ENCOUNTER — Telehealth: Payer: Self-pay

## 2016-07-30 NOTE — Telephone Encounter (Signed)
Patient has a voicemal that is not set up to receive messages on only contact number listed. Attempted to contact mother but both numbers are disconnected and attempted to contact aunt with no answer. Will attempt to reach patient later.

## 2016-07-31 NOTE — Telephone Encounter (Signed)
  07/25/2016 08:23 AM Phone (Outgoing) Diana Moreno, Diana Moreno (Self) [phone] (H)   No Answer/Busy - Tried calling pt to reschedule echo for today but the patient does not have a VM set up.     By Elita Boone    07/25/2016 11:07 AM Phone (Outgoing) Diana Moreno, Diana Moreno (Self) [phone] (H)   No Answer/Busy - Attempted to call patient to move her appt time for her echo today due to tech needing to leave for an emergency. Pt does not have VM set up    By Elita Boone

## 2016-08-13 ENCOUNTER — Other Ambulatory Visit: Payer: Self-pay

## 2016-08-13 ENCOUNTER — Ambulatory Visit (HOSPITAL_COMMUNITY): Payer: Medicaid Other | Attending: Cardiology

## 2016-08-13 DIAGNOSIS — I340 Nonrheumatic mitral (valve) insufficiency: Secondary | ICD-10-CM | POA: Diagnosis not present

## 2016-08-13 DIAGNOSIS — I255 Ischemic cardiomyopathy: Secondary | ICD-10-CM

## 2016-08-13 DIAGNOSIS — I428 Other cardiomyopathies: Secondary | ICD-10-CM | POA: Insufficient documentation

## 2016-08-13 MED ORDER — PERFLUTREN LIPID MICROSPHERE
1.0000 mL | INTRAVENOUS | Status: AC | PRN
  Administered 2016-08-13: 2 mL via INTRAVENOUS

## 2016-08-26 ENCOUNTER — Encounter: Payer: Self-pay | Admitting: Vascular Surgery

## 2016-09-02 ENCOUNTER — Encounter: Payer: Self-pay | Admitting: *Deleted

## 2016-09-04 ENCOUNTER — Encounter: Payer: Self-pay | Admitting: Vascular Surgery

## 2016-09-04 ENCOUNTER — Ambulatory Visit (INDEPENDENT_AMBULATORY_CARE_PROVIDER_SITE_OTHER): Payer: Medicaid Other | Admitting: Vascular Surgery

## 2016-09-04 ENCOUNTER — Ambulatory Visit (HOSPITAL_COMMUNITY): Payer: Medicaid Other

## 2016-09-04 VITALS — BP 154/101 | HR 89 | Temp 97.4°F | Resp 18 | Ht 65.0 in | Wt 180.0 lb

## 2016-09-04 DIAGNOSIS — Z95828 Presence of other vascular implants and grafts: Secondary | ICD-10-CM
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
## 2016-09-09 NOTE — Addendum Note (Signed)
Addended by: Burton Apley A on: 09/09/2016 10:20 AM   Modules accepted: Orders

## 2016-09-13 ENCOUNTER — Encounter (HOSPITAL_COMMUNITY): Payer: Medicaid Other

## 2016-09-17 ENCOUNTER — Ambulatory Visit (INDEPENDENT_AMBULATORY_CARE_PROVIDER_SITE_OTHER): Payer: Medicaid Other | Admitting: *Deleted

## 2016-09-17 DIAGNOSIS — I429 Cardiomyopathy, unspecified: Secondary | ICD-10-CM | POA: Diagnosis not present

## 2016-09-17 LAB — CUP PACEART REMOTE DEVICE CHECK
Battery Remaining Percentage: 100 %
Brady Statistic RV Percent Paced: 0 %
Date Time Interrogation Session: 20180501082100
HighPow Impedance: 80 Ohm
Implantable Lead Implant Date: 20150514
Implantable Pulse Generator Implant Date: 20150514
Lead Channel Impedance Value: 1164 Ohm
Lead Channel Pacing Threshold Amplitude: 2.8 V
Lead Channel Pacing Threshold Pulse Width: 0.8 ms
Lead Channel Setting Pacing Amplitude: 4 V
Lead Channel Setting Sensing Sensitivity: 0.6 mV
MDC IDC LEAD LOCATION: 753860
MDC IDC LEAD SERIAL: 341088
MDC IDC MSMT BATTERY REMAINING LONGEVITY: 120 mo
MDC IDC SET LEADCHNL RV PACING PULSEWIDTH: 0.8 ms
Pulse Gen Serial Number: 115386

## 2016-09-17 NOTE — Progress Notes (Signed)
Remote ICD transmission.   

## 2016-09-18 ENCOUNTER — Encounter: Payer: Self-pay | Admitting: Cardiology

## 2016-10-02 ENCOUNTER — Encounter: Payer: Self-pay | Admitting: Cardiology

## 2016-12-10 DIAGNOSIS — Z0279 Encounter for issue of other medical certificate: Secondary | ICD-10-CM

## 2016-12-17 ENCOUNTER — Ambulatory Visit (INDEPENDENT_AMBULATORY_CARE_PROVIDER_SITE_OTHER): Payer: Medicaid Other | Admitting: *Deleted

## 2016-12-17 DIAGNOSIS — I429 Cardiomyopathy, unspecified: Secondary | ICD-10-CM

## 2016-12-17 NOTE — Progress Notes (Signed)
Remote ICD transmission.   

## 2016-12-20 ENCOUNTER — Encounter: Payer: Self-pay | Admitting: Cardiology

## 2017-01-24 LAB — CUP PACEART REMOTE DEVICE CHECK
Battery Remaining Longevity: 114 mo
Brady Statistic RV Percent Paced: 0 %
Date Time Interrogation Session: 20180731082100
HighPow Impedance: 96 Ohm
Implantable Lead Implant Date: 20150514
Implantable Lead Location: 753860
Implantable Lead Model: 292
Lead Channel Setting Pacing Amplitude: 4 V
Lead Channel Setting Pacing Pulse Width: 0.8 ms
MDC IDC LEAD SERIAL: 341088
MDC IDC MSMT BATTERY REMAINING PERCENTAGE: 100 %
MDC IDC MSMT LEADCHNL RV IMPEDANCE VALUE: 1196 Ohm
MDC IDC MSMT LEADCHNL RV PACING THRESHOLD AMPLITUDE: 2.8 V
MDC IDC MSMT LEADCHNL RV PACING THRESHOLD PULSEWIDTH: 0.8 ms
MDC IDC PG IMPLANT DT: 20150514
MDC IDC PG SERIAL: 115386
MDC IDC SET LEADCHNL RV SENSING SENSITIVITY: 0.6 mV

## 2017-03-11 ENCOUNTER — Encounter: Payer: Self-pay | Admitting: Nurse Practitioner
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
## 2017-03-12 ENCOUNTER — Encounter: Payer: Self-pay | Admitting: Nurse Practitioner

## 2017-03-12 ENCOUNTER — Ambulatory Visit (INDEPENDENT_AMBULATORY_CARE_PROVIDER_SITE_OTHER): Payer: Medicaid Other | Admitting: Nurse Practitioner

## 2017-03-12 VITALS — BP 102/68 | HR 88 | Ht 65.0 in | Wt 180.0 lb

## 2017-03-12 DIAGNOSIS — I5022 Chronic systolic (congestive) heart failure: Secondary | ICD-10-CM | POA: Diagnosis not present

## 2017-03-12 DIAGNOSIS — I428 Other cardiomyopathies: Secondary | ICD-10-CM | POA: Diagnosis not present

## 2017-03-12 NOTE — Patient Instructions (Addendum)
Medication Instructions:   Your physician recommends that you continue on your current medications as directed. Please refer to the Current Medication list given to you today.   If you need a refill on your cardiac medications before your next appointment, please call your pharmacy.  Labwork: NONE ORDERED  TODAY    Testing/Procedures: NONE ORDERED  TODAY   Follow-Up:   AS SCHEDULED   Any Other Special Instructions Will Be Listed Below (If Applicable).                                                                                                                                                   

## 2017-03-13 LAB — CUP PACEART INCLINIC DEVICE CHECK
Date Time Interrogation Session: 20181025080045
Implantable Lead Implant Date: 20150514
Implantable Lead Location: 753860
Implantable Lead Model: 292
Implantable Lead Serial Number: 341088
Implantable Pulse Generator Implant Date: 20150514
Pulse Gen Serial Number: 115386

## 2017-03-18 ENCOUNTER — Ambulatory Visit (INDEPENDENT_AMBULATORY_CARE_PROVIDER_SITE_OTHER): Payer: Medicaid Other | Admitting: *Deleted

## 2017-03-18 DIAGNOSIS — I428 Other cardiomyopathies: Secondary | ICD-10-CM | POA: Diagnosis not present

## 2017-03-18 NOTE — Progress Notes (Signed)
Remote ICD transmission.   

## 2017-03-26 ENCOUNTER — Encounter: Payer: Self-pay | Admitting: Cardiology

## 2017-04-15 LAB — CUP PACEART REMOTE DEVICE CHECK
Battery Remaining Longevity: 114 mo
Battery Remaining Percentage: 100 %
Brady Statistic RV Percent Paced: 0 %
HighPow Impedance: 98 Ohm
Implantable Lead Implant Date: 20150514
Implantable Lead Location: 753860
Implantable Lead Model: 292
Implantable Lead Serial Number: 341088
Implantable Pulse Generator Implant Date: 20150514
Lead Channel Pacing Threshold Pulse Width: 0.8 ms
Lead Channel Setting Sensing Sensitivity: 0.6 mV
MDC IDC MSMT LEADCHNL RV IMPEDANCE VALUE: 1200 Ohm
MDC IDC MSMT LEADCHNL RV PACING THRESHOLD AMPLITUDE: 3.1 V
MDC IDC PG SERIAL: 115386
MDC IDC SESS DTM: 20181030082200
MDC IDC SET LEADCHNL RV PACING AMPLITUDE: 4 V
MDC IDC SET LEADCHNL RV PACING PULSEWIDTH: 0.8 ms

## 2017-06-17 ENCOUNTER — Ambulatory Visit (INDEPENDENT_AMBULATORY_CARE_PROVIDER_SITE_OTHER): Payer: Medicaid Other | Admitting: *Deleted

## 2017-06-17 DIAGNOSIS — I428 Other cardiomyopathies: Secondary | ICD-10-CM | POA: Diagnosis not present

## 2017-06-17 NOTE — Progress Notes (Signed)
Remote ICD transmission.   

## 2017-06-19 ENCOUNTER — Encounter: Payer: Self-pay | Admitting: Cardiology

## 2017-06-24 ENCOUNTER — Ambulatory Visit (INDEPENDENT_AMBULATORY_CARE_PROVIDER_SITE_OTHER): Payer: Medicaid Other

## 2017-06-24 ENCOUNTER — Ambulatory Visit (INDEPENDENT_AMBULATORY_CARE_PROVIDER_SITE_OTHER): Payer: Medicaid Other | Admitting: Sports Medicine

## 2017-06-24 ENCOUNTER — Encounter: Payer: Self-pay | Admitting: Sports Medicine

## 2017-06-24 DIAGNOSIS — M4855XD Collapsed vertebra, not elsewhere classified, thoracolumbar region, subsequent encounter for fracture with routine healing: Secondary | ICD-10-CM | POA: Diagnosis not present

## 2017-06-24 DIAGNOSIS — M542 Cervicalgia: Secondary | ICD-10-CM

## 2017-06-24 DIAGNOSIS — M4850XA Collapsed vertebra, not elsewhere classified, site unspecified, initial encounter for fracture: Secondary | ICD-10-CM

## 2017-06-24 DIAGNOSIS — M5136 Other intervertebral disc degeneration, lumbar region: Secondary | ICD-10-CM | POA: Diagnosis not present

## 2017-06-24 DIAGNOSIS — M4854XA Collapsed vertebra, not elsewhere classified, thoracic region, initial encounter for fracture: Secondary | ICD-10-CM

## 2017-06-24 DIAGNOSIS — M51369 Other intervertebral disc degeneration, lumbar region without mention of lumbar back pain or lower extremity pain: Secondary | ICD-10-CM | POA: Insufficient documentation

## 2017-06-24 MED ORDER — PREGABALIN 25 MG PO CAPS: 25.0000 mg | ORAL_CAPSULE | Freq: Two times a day (BID) | ORAL | 0 refills | Status: DC
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
## 2017-06-24 NOTE — Assessment & Plan Note (Signed)
X-ray, MRI, she is a greater than 6 weeks of symptoms.

## 2017-06-24 NOTE — Assessment & Plan Note (Signed)
6 weeks post injury, soft collar for now, no TLSO brace. Fractures are stable. I would like to repeat her cervical, thoracic and lumbar spine x-rays, considering her new onset periscapular radicular symptoms we also need a cervicothoracic MRI.

## 2017-06-25 ENCOUNTER — Telehealth: Payer: Self-pay | Admitting: Sports Medicine

## 2017-06-25 NOTE — Telephone Encounter (Signed)
Received fax for PA on Lyrica printed for for medicaid filled out and put in prescribers box for signature will fax when signed. - CF

## 2017-06-25 NOTE — Addendum Note (Signed)
Addended by: Monica Becton on: 06/25/2017 08:43 AM   Modules accepted: Orders

## 2017-07-07 LAB — CUP PACEART REMOTE DEVICE CHECK
Date Time Interrogation Session: 20190129092200
HIGH POWER IMPEDANCE MEASURED VALUE: 91 Ohm
Implantable Lead Serial Number: 341088
Lead Channel Pacing Threshold Amplitude: 3.1 V
Lead Channel Setting Pacing Amplitude: 4 V
Lead Channel Setting Pacing Pulse Width: 0.8 ms
MDC IDC LEAD IMPLANT DT: 20150514
MDC IDC LEAD LOCATION: 753860
MDC IDC MSMT BATTERY REMAINING LONGEVITY: 114 mo
MDC IDC MSMT BATTERY REMAINING PERCENTAGE: 100 %
MDC IDC MSMT LEADCHNL RV IMPEDANCE VALUE: 1219 Ohm
MDC IDC MSMT LEADCHNL RV PACING THRESHOLD PULSEWIDTH: 0.8 ms
MDC IDC PG IMPLANT DT: 20150514
MDC IDC PG SERIAL: 115386
MDC IDC SET LEADCHNL RV SENSING SENSITIVITY: 0.6 mV
MDC IDC STAT BRADY RV PERCENT PACED: 0 %

## 2017-07-15 ENCOUNTER — Ambulatory Visit (INDEPENDENT_AMBULATORY_CARE_PROVIDER_SITE_OTHER): Payer: Medicaid Other

## 2017-07-15 DIAGNOSIS — Z13820 Encounter for screening for osteoporosis: Secondary | ICD-10-CM

## 2017-07-18 NOTE — Telephone Encounter (Signed)
Physician signed form and awaiting response.

## 2017-07-22 ENCOUNTER — Ambulatory Visit (INDEPENDENT_AMBULATORY_CARE_PROVIDER_SITE_OTHER): Payer: Medicaid Other | Admitting: Sports Medicine

## 2017-07-22 ENCOUNTER — Encounter: Payer: Self-pay | Admitting: Sports Medicine

## 2017-07-22 DIAGNOSIS — M51369 Other intervertebral disc degeneration, lumbar region without mention of lumbar back pain or lower extremity pain: Secondary | ICD-10-CM

## 2017-07-22 DIAGNOSIS — M5412 Radiculopathy, cervical region: Secondary | ICD-10-CM

## 2017-07-22 DIAGNOSIS — M5136 Other intervertebral disc degeneration, lumbar region: Secondary | ICD-10-CM | POA: Diagnosis not present

## 2017-07-22 MED ORDER — DULOXETINE HCL 20 MG PO CPEP: 20.0000 mg | ORAL_CAPSULE | Freq: Every day | ORAL | 3 refills | Status: DC

## 2017-07-22 NOTE — Assessment & Plan Note (Addendum)
With bilateral periscapular radicular symptoms. Adding a burst of prednisone. Patient initially declined Cymbalta, then changed her mind, calling in 20 mg Cymbalta, we will monitor her for signs of development of mania or hypomania. Unable to get Lyrica approved, declines gabapentin. We can revisit this is a follow-up visit, we may need to use Savella. Ultimately I think her main issue is simply myofascial pain syndrome/fibromyalgia.  Ultimately we need to make sure that her ICD is MRI safe, if not we will just need to do cervical and lumbar CTs.

## 2017-07-22 NOTE — Assessment & Plan Note (Addendum)
We are going to try again for the MRI, she is failed greater than 6 weeks of conservative measures, has right-sided foot drop. Medicaid initially denied her lumbar spine MRI.  Ultimately we need to make sure that her ICD is MRI safe, if not we will just need to do cervical and lumbar CTs.
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
## 2017-07-25 ENCOUNTER — Telehealth: Payer: Self-pay | Admitting: Internal Medicine

## 2017-07-25 NOTE — Telephone Encounter (Signed)
New message    Patient calling upset, she was informed by radiology she can never  have a MRI because of her device. Patient is wanting to discuss other testing.  Please call

## 2017-07-25 NOTE — Telephone Encounter (Signed)
Spoke with pt regarding this informed her that her device is not labeled as MRI capable, pt very frustrated and tearful that nobody told her that her device was not MRI capable.that she was in her 30's when the device is put in and she thought that someone would have the forethought to put an MRI capable device in since she was younger when the device was placed. PT stated that she has 4 broken vertebrae and was in a lot of pain and she would not be able to have anything done about her back if she did not have and MRI. Pt wants to know if she supposed to live with a broke back for 10 years until I can get a new device placed. I informed her that I was not able to answer that question and that I would speak with Dr. Graciela Husbands and Joey [employer] rep about if there are any available alternative options. Pt voiced understanding.

## 2017-08-01 ENCOUNTER — Telehealth: Payer: Self-pay | Admitting: Sports Medicine

## 2017-08-01 DIAGNOSIS — M51369 Other intervertebral disc degeneration, lumbar region without mention of lumbar back pain or lower extremity pain: Secondary | ICD-10-CM

## 2017-08-01 DIAGNOSIS — M542 Cervicalgia: Secondary | ICD-10-CM

## 2017-08-01 DIAGNOSIS — M5136 Other intervertebral disc degeneration, lumbar region: Secondary | ICD-10-CM

## 2017-08-01 NOTE — Telephone Encounter (Signed)
If she needs an MRI we can probably get it done at [facility]

## 2017-08-01 NOTE — Telephone Encounter (Signed)
Will work on Agricultural engineer location.

## 2017-08-01 NOTE — Telephone Encounter (Signed)
Please see note from Dr. Graciela Husbands, Pt cannot have MRI at [facility]. If provider wants it done, they may be able to complete at [facility] based on the device implant she has. Will route.

## 2017-08-01 NOTE — Telephone Encounter (Signed)
Spoke with Diana Moreno at Dr. Benjamin Stain office informed her that Dr. Graciela Husbands had recommended that if pt needed an MRI that [facility] may be willing to do the MRI, Annalucia stated that she would route this information in a phone note to Dr. Benjamin Stain.

## 2017-08-01 NOTE — Telephone Encounter (Signed)
Auth updated and order faxed to [facility]. Pt aware of status update.

## 2017-08-01 NOTE — Telephone Encounter (Signed)
I am happy to place new orders, I will do them as external orders so that they can be done and scheduled at [facility].

## 2017-08-04 ENCOUNTER — Other Ambulatory Visit: Payer: Self-pay

## 2017-08-14 ENCOUNTER — Other Ambulatory Visit: Payer: Self-pay | Admitting: Internal Medicine

## 2017-08-14 DIAGNOSIS — I420 Dilated cardiomyopathy: Secondary | ICD-10-CM

## 2017-08-15 ENCOUNTER — Encounter: Payer: Self-pay | Admitting: Sports Medicine

## 2017-08-15 DIAGNOSIS — E278 Other specified disorders of adrenal gland: Secondary | ICD-10-CM | POA: Insufficient documentation

## 2017-08-29 ENCOUNTER — Encounter: Payer: Self-pay | Admitting: Internal Medicine

## 2017-08-29 ENCOUNTER — Telehealth: Payer: Self-pay | Admitting: Sports Medicine

## 2017-08-29 NOTE — Telephone Encounter (Signed)
Jeana called back and states we have to send the request for the CD. I faxed a request to [phone] per patient request. I am not sure they will send the CD without a release.

## 2017-08-29 NOTE — Telephone Encounter (Signed)
Pt had her MRI done at [facility]. Questions results, report viewable in Care Everywhere.

## 2017-08-29 NOTE — Telephone Encounter (Signed)
Cervical spine MRI shows some mild degenerative changes, these are likely the cause of her pain, she does have the spinous process fracture, but no intervention is needed for this, regarding her lumbar spine, she does have again mild degenerative changes that we can discuss interventional treatment for, incidentally noted was a left adrenal mass, she will need to follow-up with Dr. Marcy Siren at the family practice center for further evaluation of this, it is going to need an adrenal protocol MRI of the abdomen.  I think she would respond to a cervical and/or lumbar epidural.  Remind her to also get the MRI results and pictures on a disc for me to review.

## 2017-08-29 NOTE — Telephone Encounter (Signed)
She had another CT in 2017 that showed the similar mass, looked to be benign, about the same size.  I do suspect no further evaluation is needed but this should really be managed and advice offered by her PCP.

## 2017-08-29 NOTE — Telephone Encounter (Signed)
Pt advised. She will follow up with PCP. Request has been sent for MRI results. Pt would like to be contacted once disc arrives so she can be scheduled for MRI f/u. No further questions.

## 2017-08-29 NOTE — Telephone Encounter (Signed)
Pt advised of results, verbalized understanding. She advised the adrenal mass had been noted before. She would like to compare size and see if it has changed. Looking in her chart, I see it mentioned on the CT Angio Abd/Pel w/ and/or w/o dated 08/01/2012.   She did not get the images put on a disc, she will contact [facility] and have one mailed to her. Once received, she will come in for MRI follow up to discuss next steps.

## 2017-09-04 ENCOUNTER — Ambulatory Visit: Payer: Medicaid Other | Admitting: Internal Medicine

## 2017-09-10 ENCOUNTER — Other Ambulatory Visit: Payer: Self-pay | Admitting: Internal Medicine

## 2017-09-16 ENCOUNTER — Ambulatory Visit (INDEPENDENT_AMBULATORY_CARE_PROVIDER_SITE_OTHER): Payer: Medicaid Other | Admitting: *Deleted

## 2017-09-16 DIAGNOSIS — I428 Other cardiomyopathies: Secondary | ICD-10-CM

## 2017-09-16 NOTE — Progress Notes (Signed)
Remote ICD transmission.   

## 2017-09-17 ENCOUNTER — Encounter: Payer: Self-pay | Admitting: Cardiology

## 2017-09-17 LAB — CUP PACEART REMOTE DEVICE CHECK
Battery Remaining Longevity: 108 mo
Battery Remaining Percentage: 100 %
Date Time Interrogation Session: 20190430060000
HighPow Impedance: 90 Ohm
Implantable Lead Implant Date: 20150514
Implantable Lead Location: 753860
Implantable Lead Serial Number: 341088
Implantable Pulse Generator Implant Date: 20150514
Lead Channel Pacing Threshold Pulse Width: 0.8 ms
Lead Channel Setting Sensing Sensitivity: 0.6 mV
MDC IDC MSMT LEADCHNL RV IMPEDANCE VALUE: 1238 Ohm
MDC IDC MSMT LEADCHNL RV PACING THRESHOLD AMPLITUDE: 3 V
MDC IDC PG SERIAL: 115386
MDC IDC SET LEADCHNL RV PACING AMPLITUDE: 4 V
MDC IDC SET LEADCHNL RV PACING PULSEWIDTH: 0.8 ms
MDC IDC STAT BRADY RV PERCENT PACED: 0 %

## 2017-12-04 ENCOUNTER — Other Ambulatory Visit: Payer: Self-pay | Admitting: Internal Medicine

## 2017-12-04 ENCOUNTER — Telehealth: Payer: Self-pay

## 2017-12-04 NOTE — Telephone Encounter (Signed)
Patient stated that her ICD shocked her. She did not have any symptoms. I advised her to send in a transmission. I also advised her that she should not drive but to have someone to drive her around. I told her that when she send the transmission the nurse will look at it and give her a call back.

## 2017-12-05 ENCOUNTER — Telehealth: Payer: Self-pay

## 2017-12-05 DIAGNOSIS — I472 Ventricular tachycardia, unspecified: Secondary | ICD-10-CM

## 2017-12-05 NOTE — Telephone Encounter (Signed)
[place] pt stated that she was feeling fine before and afterwards she felt ok, pt did admit to not taking her Coreg because she ran out of money and hadn't been taking it for approximately a week and a half. Stressed the importance of taking meds as prescribed pt voiced understanding pt also stated that she had prescribed aspirin and now has medicaid that would cover the prescription and asked if a prescription could be sent in to her pharmacy informed pt that I would look into this, also informed pt that I would call back with recommendations from Dr. Graciela Husbands pt voiced understanding,

## 2017-12-05 NOTE — Telephone Encounter (Signed)
Spoke with pt who stated that she could make an apt with Dr.Klein on 12/12/17@ 11:45 for f/u  Discussion for labs/ shock.

## 2017-12-05 NOTE — Telephone Encounter (Signed)
Spoke with informed her that Dr. Graciela Husbands had reviewed the episode and that he recommended she have lab work done pt voiced understanding pt stated she didn't know if she could get anyone to bring her to get her labs done today but if she couldn't today she would come first thing Monday morning. Informed pt that Dr. Graciela Husbands would also like to see her in office and someone would be calling to make that apt. Pt voiced understanding.

## 2017-12-08 ENCOUNTER — Ambulatory Visit (INDEPENDENT_AMBULATORY_CARE_PROVIDER_SITE_OTHER): Payer: Medicaid Other | Admitting: Internal Medicine

## 2017-12-08 ENCOUNTER — Telehealth: Payer: Self-pay

## 2017-12-08 ENCOUNTER — Encounter: Payer: Self-pay | Admitting: Internal Medicine

## 2017-12-08 ENCOUNTER — Other Ambulatory Visit: Payer: Medicaid Other | Admitting: *Deleted

## 2017-12-08 VITALS — BP 146/94 | HR 74 | Ht 63.0 in | Wt 176.0 lb

## 2017-12-08 DIAGNOSIS — I472 Ventricular tachycardia, unspecified: Secondary | ICD-10-CM

## 2017-12-08 LAB — BASIC METABOLIC PANEL
BUN/Creatinine Ratio: 7 — ABNORMAL LOW (ref 9–23)
BUN: 6 mg/dL (ref 6–24)
CHLORIDE: 103 mmol/L (ref 96–106)
CO2: 22 mmol/L (ref 20–29)
Calcium: 9.5 mg/dL (ref 8.7–10.2)
Creatinine, Ser: 0.81 mg/dL (ref 0.57–1.00)
GFR calc Af Amer: 104 mL/min/{1.73_m2} (ref >59)
GFR calc non Af Amer: 90 mL/min/{1.73_m2} (ref >59)
GLUCOSE: 95 mg/dL (ref 65–99)
Potassium: 4.3 mmol/L (ref 3.5–5.2)
SODIUM: 140 mmol/L (ref 134–144)

## 2017-12-08 LAB — MAGNESIUM: MAGNESIUM: 2.2 mg/dL (ref 1.6–2.3)

## 2017-12-08 MED ORDER — METOPROLOL SUCCINATE ER 25 MG PO TB24: 25.0000 mg | ORAL_TABLET | Freq: Two times a day (BID) | ORAL | 3 refills | Status: DC

## 2017-12-08 NOTE — Patient Instructions (Signed)
Medication Instructions:  Your physician has recommended you make the following change in your medication:  1) Start Metoprolol Succinate 25 mg twice daily   Labwork: Your physician recommends that you return for lab work today:  CBC/BMP/Mag   Testing/Procedures: None ordered   Follow-Up: Your physician recommends that you schedule a follow-up appointment TBD  Remote monitoring is used to monitor your ICD from home. This monitoring reduces the number of office visits required to check your device to one time per year. It allows [place] to keep an eye on the functioning of your device to ensure it is working properly. You are scheduled for a device check from home daily.  You may send your transmission at any time that day. If you have a wireless device, the transmission will be sent automatically. After your physician reviews your transmission, you will receive a postcard with your next transmission date.    Any Other Special Instructions Will Be Listed Below (If Applicable).     If you need a refill on your cardiac medications before your next appointment, please call your pharmacy.
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
## 2017-12-08 NOTE — Telephone Encounter (Signed)
Spoke with pt regarding shocks over the weekend, pt admitted to not taking her medications until Sunday night pt agreeable to apt today @ 9:45 with Dr. Graciela Husbands pt stated that she had someone to drive her to apt.

## 2017-12-12 ENCOUNTER — Encounter: Payer: Self-pay | Admitting: Internal Medicine

## 2017-12-15 ENCOUNTER — Telehealth: Payer: Self-pay | Admitting: Cardiology

## 2017-12-15 ENCOUNTER — Ambulatory Visit (INDEPENDENT_AMBULATORY_CARE_PROVIDER_SITE_OTHER): Payer: Medicaid Other | Admitting: *Deleted

## 2017-12-15 DIAGNOSIS — I472 Ventricular tachycardia, unspecified: Secondary | ICD-10-CM

## 2017-12-15 NOTE — Progress Notes (Signed)
Remote ICD transmission.   

## 2017-12-15 NOTE — Telephone Encounter (Signed)
Spoke with pt and reminded pt of remote transmission that is due today. Pt verbalized understanding.   

## 2017-12-16 ENCOUNTER — Encounter: Payer: Self-pay | Admitting: Cardiology

## 2017-12-16 ENCOUNTER — Other Ambulatory Visit: Payer: Self-pay | Admitting: Internal Medicine

## 2017-12-23 ENCOUNTER — Other Ambulatory Visit: Payer: Self-pay | Admitting: Internal Medicine

## 2017-12-23 DIAGNOSIS — I420 Dilated cardiomyopathy: Secondary | ICD-10-CM

## 2018-01-05 ENCOUNTER — Encounter: Payer: Self-pay | Admitting: Internal Medicine

## 2018-01-05 ENCOUNTER — Ambulatory Visit: Payer: Medicaid Other | Admitting: Internal Medicine

## 2018-01-05 VITALS — BP 122/76 | HR 72 | Ht 63.0 in | Wt 173.0 lb

## 2018-01-05 DIAGNOSIS — M549 Dorsalgia, unspecified: Secondary | ICD-10-CM

## 2018-01-05 DIAGNOSIS — I420 Dilated cardiomyopathy: Secondary | ICD-10-CM | POA: Diagnosis not present

## 2018-01-05 DIAGNOSIS — I480 Paroxysmal atrial fibrillation: Secondary | ICD-10-CM

## 2018-01-05 DIAGNOSIS — Z720 Tobacco use: Secondary | ICD-10-CM | POA: Diagnosis not present

## 2018-01-05 MED ORDER — RIVAROXABAN 20 MG PO TABS: 20.0000 mg | ORAL_TABLET | Freq: Every day | ORAL | 11 refills | Status: DC
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
## 2018-01-05 NOTE — Patient Instructions (Addendum)
Your physician has recommended you make the following change in your medication:  1.) Xarelto 20 mg--take one tablet by mouth once a day  Your physician recommends that you return for lab work now (lipids, cbc)  You have been referred to physical therapy for back pain/strengthening  Your physician wants you to follow-up in: March, 2020 with Dr. Tenny Craw.  You will receive a reminder letter in the mail two months in advance. If you don't receive a letter, please call our office to schedule the follow-up appointment.

## 2018-01-06 LAB — CBC
Hematocrit: 43.5 % (ref 34.0–46.6)
Hemoglobin: 14.5 g/dL (ref 11.1–15.9)
MCH: 29.1 pg (ref 26.6–33.0)
MCHC: 33.3 g/dL (ref 31.5–35.7)
MCV: 87 fL (ref 79–97)
PLATELETS: 292 10*3/uL (ref 150–450)
RBC: 4.98 x10E6/uL (ref 3.77–5.28)
RDW: 16.3 % — ABNORMAL HIGH (ref 12.3–15.4)
WBC: 16.8 10*3/uL — AB (ref 3.4–10.8)

## 2018-01-06 LAB — LIPID PANEL
Chol/HDL Ratio: 3.5 ratio (ref 0.0–4.4)
Cholesterol, Total: 129 mg/dL (ref 100–199)
HDL: 37 mg/dL — AB (ref >39)
LDL Calculated: 68 mg/dL (ref 0–99)
Triglycerides: 122 mg/dL (ref 0–149)
VLDL CHOLESTEROL CAL: 24 mg/dL (ref 5–40)

## 2018-01-06 NOTE — Telephone Encounter (Signed)
Pt doesn't have a VM box set up, pt needs an apt with device clinic to change VF zone to 240bpm and VT stability per SK.

## 2018-01-13 ENCOUNTER — Encounter: Payer: Self-pay | Admitting: *Deleted

## 2018-01-20 ENCOUNTER — Ambulatory Visit: Payer: Medicaid Other | Admitting: Physical Therapy

## 2018-01-23 ENCOUNTER — Telehealth: Payer: Self-pay | Admitting: *Deleted

## 2018-01-23 NOTE — Telephone Encounter (Signed)
Attempted to call patient d/t VT treated with ATP x1. VM not set up.sss

## 2018-01-25 ENCOUNTER — Other Ambulatory Visit: Payer: Self-pay | Admitting: Internal Medicine

## 2018-01-25 DIAGNOSIS — I420 Dilated cardiomyopathy: Secondary | ICD-10-CM

## 2018-01-26 ENCOUNTER — Ambulatory Visit: Payer: Medicaid Other | Attending: Internal Medicine | Admitting: Physical Therapy

## 2018-01-26 ENCOUNTER — Encounter: Payer: Self-pay | Admitting: Physical Therapy

## 2018-01-26 ENCOUNTER — Other Ambulatory Visit: Payer: Self-pay

## 2018-01-26 DIAGNOSIS — M545 Low back pain, unspecified: Secondary | ICD-10-CM

## 2018-01-26 DIAGNOSIS — G8929 Other chronic pain: Secondary | ICD-10-CM | POA: Diagnosis not present

## 2018-01-26 DIAGNOSIS — M62830 Muscle spasm of back: Secondary | ICD-10-CM | POA: Diagnosis not present

## 2018-01-27 ENCOUNTER — Encounter: Payer: Self-pay | Admitting: Physical Therapy

## 2018-01-27 ENCOUNTER — Telehealth: Payer: Self-pay | Admitting: Internal Medicine

## 2018-01-27 LAB — CUP PACEART REMOTE DEVICE CHECK
Brady Statistic RV Percent Paced: 1 %
Date Time Interrogation Session: 20190729195400
HIGH POWER IMPEDANCE MEASURED VALUE: 91 Ohm
Implantable Pulse Generator Implant Date: 20150514
Lead Channel Impedance Value: 1280 Ohm
Lead Channel Pacing Threshold Amplitude: 2.9 V
Lead Channel Setting Pacing Amplitude: 4 V
Lead Channel Setting Pacing Pulse Width: 0.8 ms
MDC IDC LEAD IMPLANT DT: 20150514
MDC IDC LEAD LOCATION: 753860
MDC IDC LEAD SERIAL: 341088
MDC IDC MSMT BATTERY REMAINING LONGEVITY: 90 mo
MDC IDC MSMT BATTERY REMAINING PERCENTAGE: 96 %
MDC IDC MSMT LEADCHNL RV PACING THRESHOLD PULSEWIDTH: 0.8 ms
MDC IDC SET LEADCHNL RV SENSING SENSITIVITY: 0.6 mV
Pulse Gen Serial Number: 115386

## 2018-01-27 NOTE — Telephone Encounter (Signed)
Attempted x2 to reach patient. No answer. VM box that has not been set up.

## 2018-01-27 NOTE — Addendum Note (Signed)
Addended by: Dessie Coma on: 01/27/2018 01:33 PM   Modules accepted: Orders
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
## 2018-01-27 NOTE — Telephone Encounter (Signed)
Spoke with pt regarding ATP episode from 01/22/2018. Pt stated I am glad you called I put in a call to [facility] because something is wrong and I wanted to know if something showed up on the MRI in May, asked pt what her symptoms were pt reported lower back and abdominal pain as well as thinking that she has something wrong gynecological wrong, pt stated that she was going to the urgent care this afternoon, informed pt that this was one appropriate course of action for her symptoms. Informed pt of driving restrictions x 6 months. Pt reported compliance with medications even though she stated life "is crazy right now."

## 2018-01-27 NOTE — Telephone Encounter (Signed)
New Message ° ° ° ° ° ° ° ° ° °Patient returned your call °

## 2018-01-28 DIAGNOSIS — F419 Anxiety disorder, unspecified: Secondary | ICD-10-CM | POA: Diagnosis not present

## 2018-01-28 DIAGNOSIS — I493 Ventricular premature depolarization: Secondary | ICD-10-CM | POA: Diagnosis not present

## 2018-01-28 DIAGNOSIS — A599 Trichomoniasis, unspecified: Secondary | ICD-10-CM | POA: Diagnosis not present

## 2018-01-29 DIAGNOSIS — I10 Essential (primary) hypertension: Secondary | ICD-10-CM | POA: Diagnosis not present

## 2018-01-29 DIAGNOSIS — H5316 Psychophysical visual disturbances: Secondary | ICD-10-CM | POA: Diagnosis not present

## 2018-01-29 DIAGNOSIS — F309 Manic episode, unspecified: Secondary | ICD-10-CM | POA: Diagnosis not present

## 2018-01-29 DIAGNOSIS — Z113 Encounter for screening for infections with a predominantly sexual mode of transmission: Secondary | ICD-10-CM | POA: Diagnosis not present

## 2018-01-31 ENCOUNTER — Other Ambulatory Visit: Payer: Self-pay | Admitting: Internal Medicine

## 2018-02-02 ENCOUNTER — Ambulatory Visit: Payer: Medicaid Other | Admitting: Physical Therapy

## 2018-02-02 ENCOUNTER — Telehealth: Payer: Self-pay | Admitting: Physical Therapy

## 2018-02-02 NOTE — Telephone Encounter (Signed)
Called patient about missed visit today.  She forgot about appointment due to having another appointment earlier this morning. She is still interested in attending and plans to put the next appointment in her phone. Liz Beach PTA

## 2018-02-10 ENCOUNTER — Ambulatory Visit: Payer: Medicaid Other | Admitting: Physical Therapy

## 2018-02-16 ENCOUNTER — Other Ambulatory Visit: Payer: Self-pay | Admitting: Internal Medicine

## 2018-02-17 ENCOUNTER — Ambulatory Visit: Payer: Medicaid Other | Attending: Internal Medicine | Admitting: Physical Therapy

## 2018-02-27 ENCOUNTER — Ambulatory Visit: Payer: Medicaid Other | Admitting: Family Medicine

## 2018-03-09 DIAGNOSIS — Z23 Encounter for immunization: Secondary | ICD-10-CM | POA: Diagnosis not present

## 2018-03-10 ENCOUNTER — Telehealth: Payer: Self-pay | Admitting: Cardiology

## 2018-03-10 ENCOUNTER — Encounter: Payer: Self-pay | Admitting: Internal Medicine

## 2018-03-10 ENCOUNTER — Ambulatory Visit (INDEPENDENT_AMBULATORY_CARE_PROVIDER_SITE_OTHER): Payer: Medicaid Other | Admitting: Internal Medicine

## 2018-03-10 VITALS — BP 104/82 | HR 66 | Ht 63.0 in | Wt 174.6 lb

## 2018-03-10 DIAGNOSIS — I420 Dilated cardiomyopathy: Secondary | ICD-10-CM

## 2018-03-10 DIAGNOSIS — I480 Paroxysmal atrial fibrillation: Secondary | ICD-10-CM

## 2018-03-10 DIAGNOSIS — Z95810 Presence of automatic (implantable) cardiac defibrillator: Secondary | ICD-10-CM

## 2018-03-10 DIAGNOSIS — I472 Ventricular tachycardia, unspecified: Secondary | ICD-10-CM

## 2018-03-10 MED ORDER — AMIODARONE HCL 200 MG PO TABS: ORAL_TABLET | ORAL | 3 refills | Status: DC

## 2018-03-10 NOTE — Telephone Encounter (Signed)
Patient called and stated that she got her flu shot yesterday and about 1 hour later she received ICD therapy from her device. Pt stated that she feels fine today.

## 2018-03-10 NOTE — Telephone Encounter (Signed)
Appt with SK today at 3:15 available per Oretha Milch, RN. Patient agreeable and will try to find a ride. She will call back if unable to arrange transportation.

## 2018-03-10 NOTE — Telephone Encounter (Signed)
Alert transmission received- "VF" episodes with ATP and shock yesterday evening. EGMs suggest AF RVR.   Diana Moreno reports that she had gotten her flu short 1-2hrs prior to the episode, she got home and started "feeling funny" and was having occasional warming sensations over her body. She has not been sleeping well lately so she laid down to rest and she reports her heart started "going crazy" and she got up to go to the shower and as soon as she turned on the water her chest got tight and she felt the shock. She then laid down and slept for 6-7hrs. She reports no caffeine, no alcohol for a few months, has been eating better and trying to take care of herself. She also reports these episodes/symptoms tend to occur around the time of her menstrual cycles. She verbalizes that she is very frustrated and wants to get things on the right track. I let her know that I would review these episodes. She verbalizes understanding and is appreciative.

## 2018-03-10 NOTE — Patient Instructions (Addendum)
Medication Instructions:  Your physician has recommended you make the following change in your medication:   1. You will begin Amiodarone. This medication will be a "taper." You will begin with a larger dose, then taper down to smaller doses.  Starting 10/23 - 11/6: Take 400mg , 2 tablets, two times per day.  Starting 11/7 - 11/14: Take 200mg , 1 tablet, two times per day  Starting 11/5: You will take 200mg , 1 tablet, once daily until instructed to discontinue.  Labwork: You will have labs drawn today: BMP, TSH, and LFT  Testing/Procedures: None ordered.  Follow-Up: Your physician recommends that you schedule a follow-up appointment in:   4-6 weeks with Gypsy Balsam, NP   Any Other Special Instructions Will Be Listed Below (If Applicable).     If you need a refill on your cardiac medications before your next appointment, please call your pharmacy.
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
## 2018-03-11 ENCOUNTER — Encounter (HOSPITAL_COMMUNITY): Payer: Self-pay

## 2018-03-11 ENCOUNTER — Ambulatory Visit: Payer: Self-pay | Admitting: Vascular Surgery

## 2018-03-11 LAB — CUP PACEART INCLINIC DEVICE CHECK
Date Time Interrogation Session: 20191023135109
Implantable Lead Implant Date: 20150514
Implantable Lead Serial Number: 341088
MDC IDC LEAD LOCATION: 753860
MDC IDC PG IMPLANT DT: 20150514
MDC IDC PG SERIAL: 115386

## 2018-03-11 LAB — BASIC METABOLIC PANEL
BUN / CREAT RATIO: 6 — AB (ref 9–23)
BUN: 4 mg/dL — AB (ref 6–24)
CHLORIDE: 109 mmol/L — AB (ref 96–106)
CO2: 18 mmol/L — ABNORMAL LOW (ref 20–29)
Calcium: 9.1 mg/dL (ref 8.7–10.2)
Creatinine, Ser: 0.67 mg/dL (ref 0.57–1.00)
GFR calc non Af Amer: 110 mL/min/{1.73_m2} (ref >59)
GFR, EST AFRICAN AMERICAN: 126 mL/min/{1.73_m2} (ref >59)
GLUCOSE: 77 mg/dL (ref 65–99)
Potassium: 4.5 mmol/L (ref 3.5–5.2)
SODIUM: 144 mmol/L (ref 134–144)

## 2018-03-11 LAB — TSH: TSH: 1.44 u[IU]/mL (ref 0.450–4.500)

## 2018-03-11 LAB — HEPATIC FUNCTION PANEL
ALT: 26 IU/L (ref 0–32)
AST: 19 IU/L (ref 0–40)
Albumin: 4.4 g/dL (ref 3.5–5.5)
Alkaline Phosphatase: 145 IU/L — ABNORMAL HIGH (ref 39–117)
BILIRUBIN TOTAL: 0.4 mg/dL (ref 0.0–1.2)
BILIRUBIN, DIRECT: 0.13 mg/dL (ref 0.00–0.40)
TOTAL PROTEIN: 6.5 g/dL (ref 6.0–8.5)

## 2018-03-12 ENCOUNTER — Telehealth: Payer: Self-pay | Admitting: Internal Medicine

## 2018-03-12 NOTE — Telephone Encounter (Signed)
error 

## 2018-03-16 ENCOUNTER — Ambulatory Visit (INDEPENDENT_AMBULATORY_CARE_PROVIDER_SITE_OTHER): Payer: Medicaid Other | Admitting: *Deleted

## 2018-03-16 DIAGNOSIS — I420 Dilated cardiomyopathy: Secondary | ICD-10-CM

## 2018-03-16 DIAGNOSIS — I5022 Chronic systolic (congestive) heart failure: Secondary | ICD-10-CM

## 2018-03-17 NOTE — Progress Notes (Signed)
Remote ICD transmission.   

## 2018-03-18 ENCOUNTER — Encounter (HOSPITAL_COMMUNITY): Payer: Self-pay

## 2018-03-18 ENCOUNTER — Ambulatory Visit: Payer: Self-pay | Admitting: Vascular Surgery

## 2018-03-31 ENCOUNTER — Other Ambulatory Visit: Payer: Self-pay | Admitting: Sports Medicine

## 2018-03-31 DIAGNOSIS — M5412 Radiculopathy, cervical region: Secondary | ICD-10-CM

## 2018-04-03 ENCOUNTER — Other Ambulatory Visit: Payer: Self-pay | Admitting: Family Medicine

## 2018-04-03 ENCOUNTER — Encounter: Payer: Self-pay | Admitting: Family Medicine

## 2018-04-03 ENCOUNTER — Other Ambulatory Visit: Payer: Self-pay

## 2018-04-03 ENCOUNTER — Ambulatory Visit (INDEPENDENT_AMBULATORY_CARE_PROVIDER_SITE_OTHER): Payer: Medicaid Other | Admitting: Family Medicine

## 2018-04-03 VITALS — BP 122/78 | HR 66 | Temp 98.6°F | Ht 63.0 in | Wt 174.0 lb

## 2018-04-03 DIAGNOSIS — R748 Abnormal levels of other serum enzymes: Secondary | ICD-10-CM

## 2018-04-03 DIAGNOSIS — N6321 Unspecified lump in the left breast, upper outer quadrant: Secondary | ICD-10-CM | POA: Diagnosis not present

## 2018-04-03 DIAGNOSIS — F411 Generalized anxiety disorder: Secondary | ICD-10-CM | POA: Diagnosis present

## 2018-04-03 DIAGNOSIS — Z720 Tobacco use: Secondary | ICD-10-CM | POA: Diagnosis not present

## 2018-04-03 DIAGNOSIS — N63 Unspecified lump in unspecified breast: Secondary | ICD-10-CM

## 2018-04-03 MED ORDER — HYDROXYZINE HCL 25 MG PO TABS: 25.0000 mg | ORAL_TABLET | Freq: Four times a day (QID) | ORAL | 2 refills | Status: DC | PRN
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
## 2018-04-03 NOTE — Patient Instructions (Signed)
It was nice to meet you today,   I have refilled your prescription for hydroxyzine.  Please take it as needed no more than every 6 hours.  The yellow paper is for our behavioral health services.  You can call them to schedule an appointment or you can make an appointment on the way out.  For the mammogram I have made a referral and you also need to call the number on the white sheet I have attached to schedule an appointment.    I would like to see you back in one month to further discuss these issues and talk about the adrenal adenoma they found on the imaging.    I would not worry about the alkaline phosphatase level.  It was slightly above normal but your other liver function labs looked normal.  We can always check it again at a later date if you are concerned.    Have a good day,   Frederic Jericho, MD

## 2018-04-04 DIAGNOSIS — N63 Unspecified lump in unspecified breast: Secondary | ICD-10-CM | POA: Insufficient documentation

## 2018-04-04 DIAGNOSIS — R748 Abnormal levels of other serum enzymes: Secondary | ICD-10-CM | POA: Insufficient documentation

## 2018-04-04 NOTE — Assessment & Plan Note (Addendum)
Lab work ordered from another physician showed an alk phos of ~150 who told her to follow up with her PCP.  Told patient I was not concerned as this was a slight elevation only and her other liver function tests were normal.  Told patient I would order a repeat lab in the future if she desired but she was content without it.

## 2018-04-04 NOTE — Assessment & Plan Note (Signed)
Patient is down to a few cigarettes per day and would like to continue to try and quit without medication or nicotine supplementation.  Told patient I could prescribe nicotine gum in the future if she is still unable to quit on her own.

## 2018-04-04 NOTE — Assessment & Plan Note (Signed)
Patient is having anxiety regarding her defibrillator going off, which she describes as PTSD.  She was recently prescribed hydroxyzine 25mg  PRN for this by another physician and she used 20 over a span of 50 days.  Refilled a prescription for 30 tabs and told patient to schedule appointment with our behavioral health team.  If patient continues to rely on her hydroxyzine will consider increasing her duloxetine as it is at a low dose currently.

## 2018-04-04 NOTE — Assessment & Plan Note (Signed)
Patient reports a breast lump in the upper outer quadrant of left breast during self breast exam but she is unsure if it is scar tissue from previous surgery.  Given patient is of the age where she could start getting routine mammograms a diagnostic mammogram was ordered and patient was told to call the number given to her to schedule an appointment.

## 2018-04-09 ENCOUNTER — Ambulatory Visit
Admission: RE | Admit: 2018-04-09 | Discharge: 2018-04-09 | Disposition: A | Discharge status: Home / Self Care | Payer: Medicaid Other | Source: Ambulatory Visit | Attending: Family Medicine | Admitting: Family Medicine

## 2018-04-09 DIAGNOSIS — R928 Other abnormal and inconclusive findings on diagnostic imaging of breast: Secondary | ICD-10-CM | POA: Diagnosis not present

## 2018-04-09 DIAGNOSIS — N6489 Other specified disorders of breast: Secondary | ICD-10-CM | POA: Diagnosis not present

## 2018-04-09 DIAGNOSIS — N63 Unspecified lump in unspecified breast: Secondary | ICD-10-CM

## 2018-04-14 ENCOUNTER — Ambulatory Visit: Payer: Self-pay
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
## 2018-04-21 ENCOUNTER — Ambulatory Visit (INDEPENDENT_AMBULATORY_CARE_PROVIDER_SITE_OTHER): Payer: Medicaid Other | Admitting: Physician Assistant

## 2018-04-21 VITALS — BP 108/78 | HR 66 | Ht 63.0 in | Wt 176.0 lb

## 2018-04-21 DIAGNOSIS — I428 Other cardiomyopathies: Secondary | ICD-10-CM

## 2018-04-21 DIAGNOSIS — I5022 Chronic systolic (congestive) heart failure: Secondary | ICD-10-CM | POA: Diagnosis not present

## 2018-04-21 DIAGNOSIS — I480 Paroxysmal atrial fibrillation: Secondary | ICD-10-CM

## 2018-04-21 DIAGNOSIS — I472 Ventricular tachycardia, unspecified: Secondary | ICD-10-CM

## 2018-04-21 DIAGNOSIS — Z79899 Other long term (current) drug therapy: Secondary | ICD-10-CM

## 2018-04-21 NOTE — Patient Instructions (Signed)
Medication Instructions:  Your physician recommends that you continue on your current medications as directed. Please refer to the Current Medication list given to you today.   If you need a refill on your cardiac medications before your next appointment, please call your pharmacy.   Lab work:   CBC TSH AND LFTS  TODAY   If you have labs (blood work) drawn today and your tests are completely normal, you will receive your results only by: Marland Kitchen MyChart Message (if you have MyChart) OR . A paper copy in the mail If you have any lab test that is abnormal or we need to change your treatment, we will call you to review the results.  Testing/Procedures:  NONE ORDERED  TODAY   Follow-Up: At [facility], you and your health needs are our priority.  As part of our continuing mission to provide you with exceptional heart care, we have created designated Provider Care Teams.  These Care Teams include your primary Cardiologist (physician) and Advanced Practice Providers (APPs -  Physician Assistants and Nurse Practitioners) who all work together to provide you with the care you need, when you need it.  You will need a follow up appointment in 2 months.  Please call our office 2 months in advance to schedule this appointment.  You may see Dr. Graciela Husbands  or one of the following Advanced Practice Providers on your designated Care Team:   Gypsy Balsam, NP . Francis Dowse, PA-C  Any Other Special Instructions Will Be Listed Below (If Applicable).

## 2018-04-22 ENCOUNTER — Telehealth: Payer: Self-pay | Admitting: *Deleted

## 2018-04-22 LAB — HEPATIC FUNCTION PANEL
ALT: 23 IU/L (ref 0–32)
AST: 19 IU/L (ref 0–40)
Albumin: 4.3 g/dL (ref 3.5–5.5)
Alkaline Phosphatase: 131 IU/L — ABNORMAL HIGH (ref 39–117)
BILIRUBIN, DIRECT: 0.1 mg/dL (ref 0.00–0.40)
Bilirubin Total: 0.4 mg/dL (ref 0.0–1.2)
Total Protein: 6.2 g/dL (ref 6.0–8.5)

## 2018-04-22 LAB — CBC
HEMATOCRIT: 39.3 % (ref 34.0–46.6)
HEMOGLOBIN: 13.1 g/dL (ref 11.1–15.9)
MCH: 29.4 pg (ref 26.6–33.0)
MCHC: 33.3 g/dL (ref 31.5–35.7)
MCV: 88 fL (ref 79–97)
Platelets: 283 10*3/uL (ref 150–450)
RBC: 4.45 x10E6/uL (ref 3.77–5.28)
RDW: 13.9 % (ref 12.3–15.4)
WBC: 15.3 10*3/uL — AB (ref 3.4–10.8)

## 2018-04-22 LAB — TSH: TSH: 1.5 u[IU]/mL (ref 0.450–4.500)

## 2018-04-22 NOTE — Telephone Encounter (Signed)
-----   Message from [facility] [place], [place] sent at 04/22/2018 12:59 PM EST ----- Blood counts are good, she is not anemic, liver enzymes and thyroid hormone remains wnl with the addition of the amiodarone.  Her WBC is elevated, she had sinus/cough/cold type symptoms.  Please send the labs to her PMD and have her follow up there for this.  Thanks renee

## 2018-04-22 NOTE — Telephone Encounter (Signed)
ATTEMPTED TO CALL PT BUT PT  VOICEMAIL HAS NOT BEEN SET UP YET.

## 2018-05-01 ENCOUNTER — Other Ambulatory Visit: Payer: Self-pay

## 2018-05-01 ENCOUNTER — Encounter: Payer: Self-pay | Admitting: Family Medicine

## 2018-05-01 ENCOUNTER — Ambulatory Visit: Payer: Medicaid Other | Admitting: Family Medicine

## 2018-05-01 VITALS — BP 101/62 | HR 77 | Temp 98.4°F | Wt 182.0 lb

## 2018-05-01 DIAGNOSIS — Z720 Tobacco use: Secondary | ICD-10-CM | POA: Diagnosis not present

## 2018-05-01 DIAGNOSIS — J069 Acute upper respiratory infection, unspecified: Secondary | ICD-10-CM | POA: Diagnosis not present

## 2018-05-01 DIAGNOSIS — Z7689 Persons encountering health services in other specified circumstances: Secondary | ICD-10-CM | POA: Diagnosis not present

## 2018-05-01 DIAGNOSIS — B9789 Other viral agents as the cause of diseases classified elsewhere: Secondary | ICD-10-CM

## 2018-05-01 DIAGNOSIS — F418 Other specified anxiety disorders: Secondary | ICD-10-CM | POA: Diagnosis not present

## 2018-05-01 NOTE — Assessment & Plan Note (Signed)
Patient has anxiety surrounding getting shocked by the implanted defibrillator device she has.  Since our last visit, she has not had any more shocks. She is taking the hydroxyzine half dose at night before bed because this is when she feels most anxious.  She may also take another half dose during the day but not every day.  Discussed with the patient the importance of getting off of the hydroxyzine eventually, especially at night, so as not to build up a dependence to it.  Discussed with patient how she is on the lowest dose of cymbalta and that increasing this or going to twice daily dosing could help with her anxiety.  Agreed to check back in a few months and to try slowly weaning off using the hydroxyzine nightly during that time.  We can discuss increasing the cymbalta at that time too, if patient still feels it is necessary.

## 2018-05-01 NOTE — Assessment & Plan Note (Signed)
Patient smokes 2 packs a week and is still trying to cut back on her own.  Counseled patient on the importance of smoking cessation in regards to cardiovascular health.  She stated she will take [place] up on offer to help with cessation when she thinks it's necessary but would like to quit on her own for now.

## 2018-05-01 NOTE — Patient Instructions (Signed)
It's great to see you again,   I am glad your defibrillator hasn't gone off again since the last visit.  The hydroxyzine I prescribed last time is something that I'd like to taper off over the next several months so that you don't start to depend on it for sleep.   We can still increase your Cymbalta or make it twice a day to help with the anxiety regarding your defibrillator.   Your cough and congestion are likely from a viral upper respiratory infection.  This will go away in time.  If it continues to get worse over the next few weeks you can always make another appointment and we can reassess it.     Continue to try to decrease your cigarette smoking if possible. You are doing great so far.    Have a nice day,   Frederic Jericho, MD.

## 2018-05-01 NOTE — Progress Notes (Signed)
[facility] Phone: [phone]   cc: anxiety, cough/congestion  Subjective:   Patient was having cough, headache, nasal congestion.  She's had this for maybe a week, and it is starting to get better.  She was taking Robitussin but saw that it interacted with cymbalta and stopped taking it.  She currently has some pressure in her forehead and feels like her cheeks are 'swollen'.  Her ears feels stopped up and she has had some greenish sputum.  She has had a cough and mild sore throat.   Patient stated she hasn't had any defibrillator shocks since the last time she was here because her cardiologist changed some settings and started her on amiodarone. She states she still has anxiety regarding this.  She takes her hydroxyzine a half pill at a time and takes maybe a full pill total in a day. She takes a half a pill at night because right before sleep is when she feels like she is going to get shocked.    Patient still smoking two packs a week.  She used to smoke two packs a day.  She wants to keep trying to quit on her own and will ask [place] if she needs any help with that.  She has not had alcohol in over six months.   ROS: See HPI for pertinent positives and negatives  Past Medical History  Family history reviewed for today's visit. No changes.  Social history- patient is a current smoker  Objective: BP 101/62   Pulse 77   Temp 98.4 F (36.9 C) (Oral)   Wt 182 lb (82.6 kg)   LMP 04/27/2018   SpO2 98%   BMI 32.24 kg/m  Gen: NAD, alert and oriented, cooperative with exam CV: normal rate, regular rhythm. No murmurs, no rubs.  Resp: LCTAB, no wheezes, crackles. normal work of breathing GI: nontender to palpation, BS present, no guarding or organomegaly Skin: No rashes, no lesions Psych: Appropriate behavior  Assessment/Plan: Tobacco abuse Patient smokes 2 packs a week and is still trying to cut back on her own.  Counseled patient on the importance of smoking  cessation in regards to cardiovascular health.  She stated she will take [place] up on offer to help with cessation when she thinks it's necessary but would like to quit on her own for now.    Viral URI with cough Patient had cough, congestion, headache, mild sore throat for the past week that is suggestive of viral upper respiratory infection.  She was taking robitussen otc but stopped when she read that it interacted with cymbalta.  Told patient that this virus will resolve on it's own.   Situational anxiety Patient has anxiety surrounding getting shocked by the implanted defibrillator device she has.  Since our last visit, she has not had any more shocks. She is taking the hydroxyzine half dose at night before bed because this is when she feels most anxious.  She may also take another half dose during the day but not every day.  Discussed with the patient the importance of getting off of the hydroxyzine eventually, especially at night, so as not to build up a dependence to it.  Discussed with patient how she is on the lowest dose of cymbalta and that increasing this or going to twice daily dosing could help with her anxiety.  Agreed to check back in a few months and to try slowly weaning off using the hydroxyzine nightly during that time.  We can discuss  increasing the cymbalta at that time too, if patient still feels it is necessary.      Frederic Jericho, MD PGY-1

## 2018-05-01 NOTE — Assessment & Plan Note (Signed)
Patient had cough, congestion, headache, mild sore throat for the past week that is suggestive of viral upper respiratory infection.  She was taking robitussen otc but stopped when she read that it interacted with cymbalta.  Told patient that this virus will resolve on it's own.

## 2018-05-19 LAB — CUP PACEART REMOTE DEVICE CHECK
Date Time Interrogation Session: 20191231075011
Implantable Lead Implant Date: 20150514
Implantable Lead Location: 753860
Implantable Lead Model: 292
MDC IDC LEAD SERIAL: 341088
MDC IDC PG IMPLANT DT: 20150514
Pulse Gen Serial Number: 115386

## 2018-05-26 ENCOUNTER — Telehealth: Payer: Self-pay | Admitting: Family Medicine

## 2018-05-26 ENCOUNTER — Telehealth: Payer: Self-pay

## 2018-05-26 MED ORDER — DULOXETINE HCL 20 MG PO CPEP: 20.0000 mg | ORAL_CAPSULE | Freq: Two times a day (BID) | ORAL | 2 refills | Status: DC

## 2018-05-26 NOTE — Telephone Encounter (Signed)
Patient called nurse line stating during last OV pcp suggested increasing her Cymbalta due to increased anxiety. Pt stated she would like to move forward with this. Please advise.

## 2018-05-26 NOTE — Telephone Encounter (Signed)
Spoke with patient who stated she is still having anxiety regarding her defibrillator.  Advised her I would increase her dose and that instructions would be on the prescription.  She is to take 20mg  Bid instead of once daily.

## 2018-06-02 ENCOUNTER — Ambulatory Visit (INDEPENDENT_AMBULATORY_CARE_PROVIDER_SITE_OTHER): Payer: Medicaid Other | Admitting: Licensed Clinical Social Worker

## 2018-06-02 DIAGNOSIS — F411 Generalized anxiety disorder: Secondary | ICD-10-CM

## 2018-06-02 DIAGNOSIS — Z7689 Persons encountering health services in other specified circumstances: Secondary | ICD-10-CM | POA: Diagnosis not present
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
## 2018-06-09 ENCOUNTER — Telehealth: Payer: Self-pay | Admitting: Licensed Clinical Social Worker

## 2018-06-09 NOTE — Telephone Encounter (Signed)
LCSW received voice message from patient that she has an appointment Jan. 22nd with [facility].     Plan:  LCSW will F/U with patient in 7 to 10 days if she does not call with an update.  Sammuel Hines, LCSW [facility]   [phone] 8:44 AM

## 2018-06-15 ENCOUNTER — Ambulatory Visit (INDEPENDENT_AMBULATORY_CARE_PROVIDER_SITE_OTHER): Payer: Medicaid Other

## 2018-06-15 DIAGNOSIS — I428 Other cardiomyopathies: Secondary | ICD-10-CM | POA: Diagnosis not present

## 2018-06-16 NOTE — Progress Notes (Signed)
Remote ICD transmission.   

## 2018-06-17 ENCOUNTER — Telehealth: Payer: Self-pay | Admitting: Licensed Clinical Social Worker

## 2018-06-17 NOTE — Telephone Encounter (Signed)
  [facility] F/U call for update on patient's appointment at [facility].   Unable to leave a message, voice mail is not set up.  Plan: LCSW will wait for patient to call.  Sammuel Hines, LCSW [facility]   [phone] 10:30 AM

## 2018-06-18 LAB — CUP PACEART REMOTE DEVICE CHECK
Battery Remaining Longevity: 96 mo
Battery Remaining Percentage: 100 %
Brady Statistic RV Percent Paced: 0 %
Date Time Interrogation Session: 20200127092100
HighPow Impedance: 94 Ohm
Implantable Lead Implant Date: 20150514
Implantable Lead Location: 753860
Implantable Lead Model: 292
Implantable Lead Serial Number: 341088
Implantable Pulse Generator Implant Date: 20150514
Lead Channel Impedance Value: 1254 Ohm
Lead Channel Pacing Threshold Amplitude: 3 V
Lead Channel Pacing Threshold Pulse Width: 0.8 ms
Lead Channel Setting Pacing Amplitude: 4 V
Lead Channel Setting Sensing Sensitivity: 0.6 mV
MDC IDC SET LEADCHNL RV PACING PULSEWIDTH: 0.8 ms
Pulse Gen Serial Number: 115386

## 2018-06-22 ENCOUNTER — Encounter: Payer: Self-pay | Admitting: Internal Medicine

## 2018-06-22 ENCOUNTER — Other Ambulatory Visit: Payer: Self-pay | Admitting: Family Medicine

## 2018-06-22 ENCOUNTER — Ambulatory Visit (INDEPENDENT_AMBULATORY_CARE_PROVIDER_SITE_OTHER): Payer: Medicaid Other | Admitting: Internal Medicine

## 2018-06-22 VITALS — BP 126/78 | HR 60 | Ht 63.0 in | Wt 184.8 lb

## 2018-06-22 DIAGNOSIS — I428 Other cardiomyopathies: Secondary | ICD-10-CM | POA: Diagnosis not present

## 2018-06-22 DIAGNOSIS — I480 Paroxysmal atrial fibrillation: Secondary | ICD-10-CM | POA: Diagnosis not present

## 2018-06-22 DIAGNOSIS — I472 Ventricular tachycardia, unspecified: Secondary | ICD-10-CM

## 2018-06-22 DIAGNOSIS — I5022 Chronic systolic (congestive) heart failure: Secondary | ICD-10-CM | POA: Diagnosis not present

## 2018-06-22 DIAGNOSIS — Z95810 Presence of automatic (implantable) cardiac defibrillator: Secondary | ICD-10-CM

## 2018-06-22 DIAGNOSIS — Z720 Tobacco use: Secondary | ICD-10-CM
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
## 2018-06-22 NOTE — Patient Instructions (Addendum)
Medication Instructions:  Your physician recommends that you continue on your current medications as directed. Please refer to the Current Medication list given to you today.  Labwork: You will have labs drawn today: CMP, TSH, CBC  Testing/Procedures: None ordered.  Follow-Up: Your physician recommends that you schedule a follow-up appointment in:   3 months with Dr Graciela Husbands  Any Other Special Instructions Will Be Listed Below (If Applicable).     If you need a refill on your cardiac medications before your next appointment, please call your pharmacy.

## 2018-06-23 LAB — CBC
HEMATOCRIT: 41.4 % (ref 34.0–46.6)
Hemoglobin: 14.1 g/dL (ref 11.1–15.9)
MCH: 31.3 pg (ref 26.6–33.0)
MCHC: 34.1 g/dL (ref 31.5–35.7)
MCV: 92 fL (ref 79–97)
Platelets: 240 10*3/uL (ref 150–450)
RBC: 4.51 x10E6/uL (ref 3.77–5.28)
RDW: 13.6 % (ref 11.7–15.4)
WBC: 13.6 10*3/uL — ABNORMAL HIGH (ref 3.4–10.8)

## 2018-06-23 LAB — CUP PACEART INCLINIC DEVICE CHECK
Date Time Interrogation Session: 20200203050000
HighPow Impedance: 59 Ohm
HighPow Impedance: 91 Ohm
Implantable Lead Implant Date: 20150514
Implantable Lead Location: 753860
Implantable Lead Model: 292
Implantable Lead Serial Number: 341088
Implantable Pulse Generator Implant Date: 20150514
Lead Channel Pacing Threshold Amplitude: 3.3 V
Lead Channel Sensing Intrinsic Amplitude: 22.7 mV
Lead Channel Setting Pacing Amplitude: 4 V
Lead Channel Setting Pacing Pulse Width: 0.8 ms
Lead Channel Setting Sensing Sensitivity: 0.6 mV
MDC IDC MSMT LEADCHNL RV IMPEDANCE VALUE: 1267 Ohm
MDC IDC MSMT LEADCHNL RV PACING THRESHOLD PULSEWIDTH: 0.8 ms
Pulse Gen Serial Number: 115386

## 2018-06-23 LAB — COMPREHENSIVE METABOLIC PANEL
ALT: 25 IU/L (ref 0–32)
AST: 18 IU/L (ref 0–40)
Albumin/Globulin Ratio: 2 (ref 1.2–2.2)
Albumin: 4.2 g/dL (ref 3.8–4.8)
Alkaline Phosphatase: 104 IU/L (ref 39–117)
BUN/Creatinine Ratio: 11 (ref 9–23)
BUN: 10 mg/dL (ref 6–24)
CHLORIDE: 104 mmol/L (ref 96–106)
CO2: 16 mmol/L — ABNORMAL LOW (ref 20–29)
Calcium: 9 mg/dL (ref 8.7–10.2)
Creatinine, Ser: 0.92 mg/dL (ref 0.57–1.00)
GFR calc Af Amer: 89 mL/min/{1.73_m2} (ref >59)
GFR calc non Af Amer: 78 mL/min/{1.73_m2} (ref >59)
Globulin, Total: 2.1 g/dL (ref 1.5–4.5)
Glucose: 116 mg/dL — ABNORMAL HIGH (ref 65–99)
POTASSIUM: 3.9 mmol/L (ref 3.5–5.2)
Sodium: 139 mmol/L (ref 134–144)
TOTAL PROTEIN: 6.3 g/dL (ref 6.0–8.5)

## 2018-06-23 LAB — TSH: TSH: 1.61 u[IU]/mL (ref 0.450–4.500)

## 2018-06-29 DIAGNOSIS — H52223 Regular astigmatism, bilateral: Secondary | ICD-10-CM | POA: Diagnosis not present

## 2018-06-29 DIAGNOSIS — H5203 Hypermetropia, bilateral: Secondary | ICD-10-CM | POA: Diagnosis not present

## 2018-06-29 DIAGNOSIS — Z7689 Persons encountering health services in other specified circumstances: Secondary | ICD-10-CM | POA: Diagnosis not present

## 2018-07-01 DIAGNOSIS — Z7689 Persons encountering health services in other specified circumstances: Secondary | ICD-10-CM | POA: Diagnosis not present

## 2018-07-01 DIAGNOSIS — F4312 Post-traumatic stress disorder, chronic: Secondary | ICD-10-CM | POA: Diagnosis not present

## 2018-07-07 DIAGNOSIS — H5213 Myopia, bilateral: Secondary | ICD-10-CM | POA: Diagnosis not present

## 2018-07-30 ENCOUNTER — Ambulatory Visit: Payer: Medicaid Other | Admitting: Family Medicine

## 2018-09-14 ENCOUNTER — Ambulatory Visit (INDEPENDENT_AMBULATORY_CARE_PROVIDER_SITE_OTHER): Payer: Medicaid Other | Admitting: *Deleted

## 2018-09-14 ENCOUNTER — Other Ambulatory Visit: Payer: Self-pay

## 2018-09-14 DIAGNOSIS — I428 Other cardiomyopathies: Secondary | ICD-10-CM

## 2018-09-14 DIAGNOSIS — I5022 Chronic systolic (congestive) heart failure: Secondary | ICD-10-CM

## 2018-09-15 LAB — CUP PACEART REMOTE DEVICE CHECK
Battery Remaining Longevity: 96 mo
Battery Remaining Percentage: 98 %
Brady Statistic RV Percent Paced: 1 %
Date Time Interrogation Session: 20200427082100
HighPow Impedance: 81 Ohm
Implantable Lead Implant Date: 20150514
Implantable Lead Location: 753860
Implantable Lead Model: 292
Implantable Lead Serial Number: 341088
Implantable Pulse Generator Implant Date: 20150514
Lead Channel Impedance Value: 1408 Ohm
Lead Channel Pacing Threshold Amplitude: 3.3 V
Lead Channel Pacing Threshold Pulse Width: 0.8 ms
Lead Channel Setting Pacing Amplitude: 4 V
Lead Channel Setting Pacing Pulse Width: 0.8 ms
Lead Channel Setting Sensing Sensitivity: 0.6 mV
Pulse Gen Serial Number: 115386

## 2018-09-23 NOTE — Progress Notes (Signed)
Remote ICD transmission.   

## 2018-09-24 ENCOUNTER — Telehealth: Payer: Self-pay

## 2018-09-24 NOTE — Telephone Encounter (Signed)
I called and spoke with patient, she is ok with switching Monday's office visit to a telehealth visit.     Virtual Visit Pre-Appointment Phone Call  "(Name), I am calling you today to discuss your upcoming appointment. We are currently trying to limit exposure to the virus that causes COVID-19 by seeing patients at home rather than in the office."  1. "What is the BEST phone number to call the day of the visit?" - include this in appointment notes  2. "Do you have or have access to (through a family member/friend) a smartphone with video capability that we can use for your visit?" a. If yes - list this number in appt notes as "cell" (if different from BEST phone #) and list the appointment type as a VIDEO visit in appointment notes b. If no - list the appointment type as a PHONE visit in appointment notes  3. Confirm consent - "In the setting of the current Covid19 crisis, you are scheduled for a (phone or video) visit with your provider on (date) at (time).  Just as we do with many in-office visits, in order for you to participate in this visit, we must obtain consent.  If you'd like, I can send this to your mychart (if signed up) or email for you to review.  Otherwise, I can obtain your verbal consent now.  All virtual visits are billed to your insurance company just like a normal visit would be.  By agreeing to a virtual visit, we'd like you to understand that the technology does not allow for your provider to perform an examination, and thus may limit your provider's ability to fully assess your condition. If your provider identifies any concerns that need to be evaluated in person, we will make arrangements to do so.  Finally, though the technology is pretty good, we cannot assure that it will always work on either your or our end, and in the setting of a video visit, we may have to convert it to a phone-only visit.  In either situation, we cannot ensure that we have a secure connection.  Are you  willing to proceed?" STAFF: Did the patient verbally acknowledge consent to telehealth visit? Document YES/NO here: YES  4. Advise patient to be prepared - "Two hours prior to your appointment, go ahead and check your blood pressure, pulse, oxygen saturation, and your weight (if you have the equipment to check those) and write them all down. When your visit starts, your provider will ask you for this information. If you have an Apple Watch or Kardia device, please plan to have heart rate information ready on the day of your appointment. Please have a pen and paper handy nearby the day of the visit as well."  5. Give patient instructions for MyChart download to smartphone OR Doximity/Doxy.me as below if video visit (depending on what platform provider is using)  6. Inform patient they will receive a phone call 15 minutes prior to their appointment time (may be from unknown caller ID) so they should be prepared to answer    TELEPHONE CALL NOTE  Diana Moreno has been deemed a candidate for a follow-up tele-health visit to limit community exposure during the Covid-19 pandemic. I spoke with the patient via phone to ensure availability of phone/video source, confirm preferred email & phone number, and discuss instructions and expectations.  I reminded Diana Moreno to be prepared with any vital sign and/or heart rhythm information that could potentially be obtained via  home monitoring, at the time of her visit. I reminded Diana Moreno to expect a phone call prior to her visit.  Lajoyce Corners, CMA 09/24/2018 9:49 AM   INSTRUCTIONS FOR DOWNLOADING THE MYCHART APP TO SMARTPHONE  - The patient must first make sure to have activated MyChart and know their login information - If Apple, go to [employer] and type in MyChart in the search bar and download the app. If Android, ask patient to go to [employer] and type in [place] in the search bar and download the app. The app is free but as  with any other app downloads, their phone may require them to verify saved payment information or Apple/Android password.  - The patient will need to then log into the app with their MyChart username and password, and select [facility] as their healthcare provider to link the account. When it is time for your visit, go to the MyChart app, find appointments, and click Begin Video Visit. Be sure to Select Allow for your device to access the Microphone and Camera for your visit. You will then be connected, and your provider will be with you shortly.  **If they have any issues connecting, or need assistance please contact MyChart service desk [phone])**  **If using a computer, in order to ensure the best quality for their visit they will need to use either of the following Internet Browsers: [employer] or Google Chrome**  IF USING DOXIMITY or DOXY.ME - The patient will receive a link just prior to their visit by text.     FULL LENGTH CONSENT FOR TELE-HEALTH VISIT   I hereby voluntarily request, consent and authorize [facility] and its employed or contracted physicians, physician assistants, nurse practitioners or other licensed health care professionals (the Practitioner), to provide me with telemedicine health care services (the "Services") as deemed necessary by the treating Practitioner. I acknowledge and consent to receive the Services by the Practitioner via telemedicine. I understand that the telemedicine visit will involve communicating with the Practitioner through live audiovisual communication technology and the disclosure of certain medical information by electronic transmission. I acknowledge that I have been given the opportunity to request an in-person assessment or other available alternative prior to the telemedicine visit and am voluntarily participating in the telemedicine visit.  I understand that I have the right to withhold or withdraw my consent to the  use of telemedicine in the course of my care at any time, without affecting my right to future care or treatment, and that the Practitioner or I may terminate the telemedicine visit at any time. I understand that I have the right to inspect all information obtained and/or recorded in the course of the telemedicine visit and may receive copies of available information for a reasonable fee.  I understand that some of the potential risks of receiving the Services via telemedicine include:  Marland Kitchen Delay or interruption in medical evaluation due to technological equipment failure or disruption; . Information transmitted may not be sufficient (e.g. poor resolution of images) to allow for appropriate medical decision making by the Practitioner; and/or  . In rare instances, security protocols could fail, causing a breach of personal health information.  Furthermore, I acknowledge that it is my responsibility to provide information about my medical history, conditions and care that is complete and accurate to the best of my ability. I acknowledge that Practitioner's advice, recommendations, and/or decision may be based on factors not within their control, such as  incomplete or inaccurate data provided by me or distortions of diagnostic images or specimens that may result from electronic transmissions. I understand that the practice of medicine is not an exact science and that Practitioner makes no warranties or guarantees regarding treatment outcomes. I acknowledge that I will receive a copy of this consent concurrently upon execution via email to the email address I last provided but may also request a printed copy by calling the office of [place].    I understand that my insurance will be billed for this visit.   I have read or had this consent read to me. . I understand the contents of this consent, which adequately explains the benefits and risks of the Services being provided via telemedicine.  . I have  been provided ample opportunity to ask questions regarding this consent and the Services and have had my questions answered to my satisfaction. . I give my informed consent for the services to be provided through the use of telemedicine in my medical care  By participating in this telemedicine visit I agree to the above.

## 2018-09-28 ENCOUNTER — Other Ambulatory Visit: Payer: Self-pay

## 2018-09-28 ENCOUNTER — Telehealth (INDEPENDENT_AMBULATORY_CARE_PROVIDER_SITE_OTHER): Payer: Medicaid Other | Admitting: Internal Medicine

## 2018-09-28 ENCOUNTER — Encounter: Payer: Self-pay | Admitting: Internal Medicine

## 2018-09-28 VITALS — Ht 63.0 in | Wt 185.0 lb

## 2018-09-28 DIAGNOSIS — Z95810 Presence of automatic (implantable) cardiac defibrillator: Secondary | ICD-10-CM

## 2018-09-28 DIAGNOSIS — I480 Paroxysmal atrial fibrillation: Secondary | ICD-10-CM

## 2018-09-28 DIAGNOSIS — I472 Ventricular tachycardia, unspecified: Secondary | ICD-10-CM

## 2018-09-28 DIAGNOSIS — I428 Other cardiomyopathies: Secondary | ICD-10-CM
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
## 2018-10-05 ENCOUNTER — Telehealth: Payer: Self-pay

## 2018-10-05 DIAGNOSIS — I4891 Unspecified atrial fibrillation: Secondary | ICD-10-CM

## 2018-10-05 NOTE — Telephone Encounter (Signed)
-----   Message from Duke Salvia, MD sent at 10/02/2018 10:50 AM EDT ----- That would be great and wanted to get your input about timing for echo so will arrange  Have a greart day ----- Message ----- From: Hillis Range, MD Sent: 10/01/2018   5:46 PM EDT To: Duke Salvia, MD  Her prior thrombus history makes me nervous.Marland KitchenMarland KitchenId be happy to see her to discuss however.  May be good to update her echo also.   ----- Message ----- From: Duke Salvia, MD Sent: 09/28/2018   3:15 PM EDT To: Hillis Range, MD, Wiliam Ke, RN  Fayrene Fearing   would you look at the strip on this young lady from 7.19   she had multiple shocks and then again 10/19 which I am interpreting as afib with tachy induced tachy but the VT was nonsustained and AF fast >> shock  She has PTSD from shocks   and would be open to ablation - PVI  thanks  Brett Canales

## 2018-10-05 NOTE — Telephone Encounter (Signed)
Pt agrees to have echo performed. She understands she will receive a call to set this up and to set up her appt with Dr Johney Frame after the echo has been performed.

## 2018-10-09 ENCOUNTER — Telehealth: Payer: Self-pay

## 2018-10-09 NOTE — Telephone Encounter (Signed)
The pt states she might have did the transmission by a mistake but did not mean to send the unscheduled transmission. I explained to her the monitor sends automatically and the only time she needs to send a manual transmission is when she is not feeling well or we call to ask for a transmission.

## 2018-10-20 ENCOUNTER — Other Ambulatory Visit: Payer: Self-pay | Admitting: Family Medicine

## 2018-10-23 ENCOUNTER — Telehealth: Payer: Self-pay | Admitting: Cardiology

## 2018-10-23 NOTE — Telephone Encounter (Signed)
Attempted to call pt and educate about home monitor used and not sending manual transmissions. No answer and unable to leave message voicemail is not set up.

## 2018-10-29 ENCOUNTER — Telehealth (HOSPITAL_COMMUNITY): Payer: Self-pay | Admitting: *Deleted

## 2018-10-29 NOTE — Telephone Encounter (Signed)
Called patient to confirm echo appointment and do COVID screening, but voicemail was not set up.

## 2018-10-30 ENCOUNTER — Other Ambulatory Visit: Payer: Self-pay

## 2018-10-30 ENCOUNTER — Ambulatory Visit (HOSPITAL_COMMUNITY): Payer: Medicaid Other | Attending: Cardiology

## 2018-10-30 DIAGNOSIS — Z7689 Persons encountering health services in other specified circumstances: Secondary | ICD-10-CM | POA: Diagnosis not present

## 2018-10-30 DIAGNOSIS — I4891 Unspecified atrial fibrillation: Secondary | ICD-10-CM

## 2018-10-30 NOTE — Telephone Encounter (Signed)
Spoke w/ pt and provided her w/ education on her home monitor. Pt verbalized understanding and stated that she isn't sure why the manual transmissions are being received. She is going to move the monitor away from her door b/c she believes the door is hitting it and causing it to send the transmission.

## 2018-11-06 ENCOUNTER — Telehealth: Payer: Self-pay

## 2018-11-06 NOTE — Telephone Encounter (Signed)
Called pt regarding appt on 11/09/18. I was unable to leave a message due to voicemail not being set up.

## 2018-11-09 ENCOUNTER — Telehealth (INDEPENDENT_AMBULATORY_CARE_PROVIDER_SITE_OTHER): Payer: Medicaid Other | Admitting: Internal Medicine

## 2018-11-09 ENCOUNTER — Encounter: Payer: Self-pay | Admitting: Internal Medicine

## 2018-11-09 VITALS — Ht 64.0 in | Wt 185.0 lb

## 2018-11-09 DIAGNOSIS — I428 Other cardiomyopathies: Secondary | ICD-10-CM

## 2018-11-09 DIAGNOSIS — I480 Paroxysmal atrial fibrillation: Secondary | ICD-10-CM | POA: Diagnosis not present

## 2018-11-09 DIAGNOSIS — I5022 Chronic systolic (congestive) heart failure: Secondary | ICD-10-CM | POA: Diagnosis not present
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
## 2018-11-16 ENCOUNTER — Telehealth: Payer: Self-pay

## 2018-11-16 NOTE — Telephone Encounter (Signed)
Attempted to speak with the pt about sending unscheduled transmission but no answer/ no voicemail.

## 2018-11-17 ENCOUNTER — Telehealth: Payer: Self-pay

## 2018-11-17 NOTE — Telephone Encounter (Signed)
Pt states she do not know why her monitor keep sending unscheduled remotes. She is going to try and move it one more time to see if the monitor will stop sending transmissions by itself.

## 2018-11-19 ENCOUNTER — Telehealth: Payer: Self-pay

## 2018-11-19 DIAGNOSIS — I480 Paroxysmal atrial fibrillation: Secondary | ICD-10-CM

## 2018-11-27 ENCOUNTER — Telehealth: Payer: Self-pay | Admitting: Cardiology

## 2018-11-27 NOTE — Telephone Encounter (Signed)
LMOVM for pt to return call. Need to re educate about not sending manual transmissions.

## 2018-11-30 NOTE — Telephone Encounter (Signed)
Call placed to Pt.  Pt scheduled for labs/covid test on July 24  TEE on July 27  Afib July 28  Will send instruction letter via [place].  No further action needed.

## 2018-11-30 NOTE — Telephone Encounter (Signed)
Patient states that she doesn't know what is wrong with her monitor. She isn't sending manual transmissions. Instructed pt to call tech support to see if they can see why we are getting transmissions. Pt verbalized understanding.

## 2018-12-11 ENCOUNTER — Other Ambulatory Visit (HOSPITAL_COMMUNITY)
Admission: RE | Admit: 2018-12-11 | Discharge: 2018-12-11 | Disposition: A | Discharge status: Home / Self Care | Payer: Medicaid Other | Source: Ambulatory Visit | Attending: Cardiovascular Disease | Admitting: Cardiovascular Disease

## 2018-12-11 ENCOUNTER — Other Ambulatory Visit: Payer: Self-pay

## 2018-12-11 ENCOUNTER — Other Ambulatory Visit: Payer: Medicaid Other | Admitting: *Deleted

## 2018-12-11 DIAGNOSIS — I480 Paroxysmal atrial fibrillation: Secondary | ICD-10-CM | POA: Diagnosis not present

## 2018-12-11 DIAGNOSIS — Z1159 Encounter for screening for other viral diseases: Secondary | ICD-10-CM | POA: Insufficient documentation

## 2018-12-12 LAB — BASIC METABOLIC PANEL
BUN/Creatinine Ratio: 11 (ref 9–23)
BUN: 9 mg/dL (ref 6–24)
CO2: 18 mmol/L — ABNORMAL LOW (ref 20–29)
Calcium: 9 mg/dL (ref 8.7–10.2)
Chloride: 108 mmol/L — ABNORMAL HIGH (ref 96–106)
Creatinine, Ser: 0.8 mg/dL (ref 0.57–1.00)
GFR calc Af Amer: 105 mL/min/{1.73_m2} (ref >59)
GFR calc non Af Amer: 91 mL/min/{1.73_m2} (ref >59)
Glucose: 88 mg/dL (ref 65–99)
Potassium: 4.5 mmol/L (ref 3.5–5.2)
Sodium: 141 mmol/L (ref 134–144)

## 2018-12-12 LAB — CBC WITH DIFFERENTIAL/PLATELET
Basophils Absolute: 0.1 10*3/uL (ref 0.0–0.2)
Basos: 1 %
EOS (ABSOLUTE): 0.2 10*3/uL (ref 0.0–0.4)
Eos: 2 %
Hematocrit: 41.7 % (ref 34.0–46.6)
Hemoglobin: 14 g/dL (ref 11.1–15.9)
Immature Grans (Abs): 0 10*3/uL (ref 0.0–0.1)
Immature Granulocytes: 0 %
Lymphocytes Absolute: 4.1 10*3/uL — ABNORMAL HIGH (ref 0.7–3.1)
Lymphs: 32 %
MCH: 31.3 pg (ref 26.6–33.0)
MCHC: 33.6 g/dL (ref 31.5–35.7)
MCV: 93 fL (ref 79–97)
Monocytes Absolute: 0.8 10*3/uL (ref 0.1–0.9)
Monocytes: 6 %
Neutrophils Absolute: 7.8 10*3/uL — ABNORMAL HIGH (ref 1.4–7.0)
Neutrophils: 59 %
Platelets: 291 10*3/uL (ref 150–450)
RBC: 4.48 x10E6/uL (ref 3.77–5.28)
RDW: 13.1 % (ref 11.7–15.4)
WBC: 13.1 10*3/uL — ABNORMAL HIGH (ref 3.4–10.8)

## 2018-12-12 LAB — SARS CORONAVIRUS 2 (TAT 6-24 HRS): SARS Coronavirus 2: NEGATIVE

## 2018-12-14 ENCOUNTER — Encounter (HOSPITAL_COMMUNITY)
Admission: RE | Disposition: A | Discharge status: Home / Self Care | Payer: Self-pay | Source: Home / Self Care | Attending: Cardiovascular Disease

## 2018-12-14 ENCOUNTER — Ambulatory Visit (HOSPITAL_COMMUNITY): Payer: Medicaid Other | Admitting: Anesthesiology

## 2018-12-14 ENCOUNTER — Ambulatory Visit (HOSPITAL_BASED_OUTPATIENT_CLINIC_OR_DEPARTMENT_OTHER)
Admission: RE | Admit: 2018-12-14 | Discharge: 2018-12-14 | Disposition: A | Discharge status: Home / Self Care | Payer: Medicaid Other | Source: Ambulatory Visit | Attending: Cardiovascular Disease | Admitting: Cardiovascular Disease

## 2018-12-14 ENCOUNTER — Ambulatory Visit (HOSPITAL_COMMUNITY)
Admission: RE | Admit: 2018-12-14 | Discharge: 2018-12-14 | Disposition: A | Discharge status: Home / Self Care | Payer: Medicaid Other | Attending: Cardiovascular Disease | Admitting: Cardiovascular Disease

## 2018-12-14 ENCOUNTER — Other Ambulatory Visit: Payer: Self-pay

## 2018-12-14 ENCOUNTER — Encounter (HOSPITAL_COMMUNITY): Payer: Self-pay | Admitting: *Deleted

## 2018-12-14 ENCOUNTER — Ambulatory Visit (INDEPENDENT_AMBULATORY_CARE_PROVIDER_SITE_OTHER): Payer: Medicaid Other | Admitting: *Deleted

## 2018-12-14 DIAGNOSIS — E282 Polycystic ovarian syndrome: Secondary | ICD-10-CM | POA: Diagnosis not present

## 2018-12-14 DIAGNOSIS — F4310 Post-traumatic stress disorder, unspecified: Secondary | ICD-10-CM | POA: Diagnosis not present

## 2018-12-14 DIAGNOSIS — I340 Nonrheumatic mitral (valve) insufficiency: Secondary | ICD-10-CM | POA: Diagnosis not present

## 2018-12-14 DIAGNOSIS — G8929 Other chronic pain: Secondary | ICD-10-CM | POA: Insufficient documentation

## 2018-12-14 DIAGNOSIS — Z6831 Body mass index (BMI) 31.0-31.9, adult: Secondary | ICD-10-CM | POA: Insufficient documentation

## 2018-12-14 DIAGNOSIS — Z7901 Long term (current) use of anticoagulants: Secondary | ICD-10-CM | POA: Insufficient documentation

## 2018-12-14 DIAGNOSIS — K449 Diaphragmatic hernia without obstruction or gangrene: Secondary | ICD-10-CM | POA: Insufficient documentation

## 2018-12-14 DIAGNOSIS — E669 Obesity, unspecified: Secondary | ICD-10-CM | POA: Diagnosis not present

## 2018-12-14 DIAGNOSIS — F17210 Nicotine dependence, cigarettes, uncomplicated: Secondary | ICD-10-CM | POA: Insufficient documentation

## 2018-12-14 DIAGNOSIS — Z95810 Presence of automatic (implantable) cardiac defibrillator: Secondary | ICD-10-CM | POA: Insufficient documentation

## 2018-12-14 DIAGNOSIS — Z79899 Other long term (current) drug therapy: Secondary | ICD-10-CM | POA: Insufficient documentation

## 2018-12-14 DIAGNOSIS — I428 Other cardiomyopathies: Secondary | ICD-10-CM

## 2018-12-14 DIAGNOSIS — I081 Rheumatic disorders of both mitral and tricuspid valves: Secondary | ICD-10-CM | POA: Diagnosis not present

## 2018-12-14 DIAGNOSIS — I2510 Atherosclerotic heart disease of native coronary artery without angina pectoris: Secondary | ICD-10-CM | POA: Insufficient documentation

## 2018-12-14 DIAGNOSIS — I739 Peripheral vascular disease, unspecified: Secondary | ICD-10-CM | POA: Diagnosis not present

## 2018-12-14 DIAGNOSIS — Z8249 Family history of ischemic heart disease and other diseases of the circulatory system: Secondary | ICD-10-CM | POA: Insufficient documentation

## 2018-12-14 DIAGNOSIS — I5022 Chronic systolic (congestive) heart failure: Secondary | ICD-10-CM | POA: Diagnosis not present

## 2018-12-14 DIAGNOSIS — Z882 Allergy status to sulfonamides status: Secondary | ICD-10-CM | POA: Diagnosis not present

## 2018-12-14 DIAGNOSIS — I480 Paroxysmal atrial fibrillation: Secondary | ICD-10-CM | POA: Diagnosis not present

## 2018-12-14 DIAGNOSIS — I371 Nonrheumatic pulmonary valve insufficiency: Secondary | ICD-10-CM

## 2018-12-14 DIAGNOSIS — F319 Bipolar disorder, unspecified: Secondary | ICD-10-CM | POA: Diagnosis not present

## 2018-12-14 HISTORY — PX: TEE WITHOUT CARDIOVERSION: SHX5443

## 2018-12-14 SURGERY — ECHOCARDIOGRAM, TRANSESOPHAGEAL
Anesthesia: Monitor Anesthesia Care

## 2018-12-14 MED ORDER — PROPOFOL 500 MG/50ML IV EMUL
INTRAVENOUS | Status: DC | PRN
  Administered 2018-12-14: 75 ug/kg/min via INTRAVENOUS

## 2018-12-14 MED ORDER — LACTATED RINGERS IV SOLN: INTRAVENOUS | Status: DC | PRN

## 2018-12-14 MED ORDER — SODIUM CHLORIDE 0.9 % IV SOLN
INTRAVENOUS | Status: DC | PRN
  Administered 2018-12-14: 09:00:00 via INTRAVENOUS

## 2018-12-14 MED ORDER — SODIUM CHLORIDE 0.9 % IV SOLN
INTRAVENOUS | Status: DC
  Administered 2018-12-14: 08:00:00 via INTRAVENOUS

## 2018-12-14 MED ORDER — PROPOFOL 10 MG/ML IV BOLUS
INTRAVENOUS | Status: DC | PRN
  Administered 2018-12-14: 30 mg via INTRAVENOUS
  Administered 2018-12-14: 50 mg via INTRAVENOUS

## 2018-12-14 NOTE — Anesthesia Procedure Notes (Signed)
Procedure Name: MAC Date/Time: 12/14/2018 9:20 AM Performed by: Neldon Newport, CRNA Pre-anesthesia Checklist: Patient being monitored, Suction available, Emergency Drugs available and Patient identified Patient Re-evaluated:Patient Re-evaluated prior to induction Oxygen Delivery Method: Nasal cannula

## 2018-12-14 NOTE — Discharge Instructions (Signed)

## 2018-12-14 NOTE — Transfer of Care (Signed)
Immediate Anesthesia Transfer of Care Note  Patient: Diana Moreno  Procedure(s) Performed: TRANSESOPHAGEAL ECHOCARDIOGRAM (TEE) (N/A )  Patient Location: Endoscopy Unit  Anesthesia Type:General  Level of Consciousness: awake, alert  and oriented  Airway & Oxygen Therapy: Patient Spontanous Breathing and Patient connected to nasal cannula oxygen  Post-op Assessment: Report given to RN, Post -op Vital signs reviewed and stable and Patient moving all extremities X 4  Post vital signs: Reviewed and stable  Last Vitals:  Vitals Value Taken Time  BP    Temp    Pulse    Resp    SpO2      Last Pain:  Vitals:   12/14/18 0812  TempSrc: Oral  PainSc: 0-No pain         Complications: No apparent anesthesia complications
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
## 2018-12-14 NOTE — Anesthesia Preprocedure Evaluation (Addendum)
Anesthesia Evaluation  Patient identified by MRN, date of birth, ID band Patient awake    Reviewed: Allergy & Precautions, H&P , NPO status , Patient's Chart, lab work & pertinent test results  Airway Mallampati: II  TM Distance: >3 FB Neck ROM: full    Dental  (+) Dental Advidsory Given   Pulmonary Current Smoker,    breath sounds clear to auscultation       Cardiovascular + CAD, + Peripheral Vascular Disease and +CHF  + Cardiac Defibrillator  Rhythm:regular Rate:Normal     Neuro/Psych PSYCHIATRIC DISORDERS Anxiety Depression Bipolar Disorder  Neuromuscular disease    GI/Hepatic hiatal hernia,   Endo/Other    Renal/GU      Musculoskeletal   Abdominal   Peds  Hematology   Anesthesia Other Findings   Reproductive/Obstetrics                            Anesthesia Physical Anesthesia Plan  ASA: III  Anesthesia Plan: MAC   Post-op Pain Management:    Induction: Intravenous  PONV Risk Score and Plan: 1 and Propofol infusion, Ondansetron and Treatment may vary due to age or medical condition  Airway Management Planned: Nasal Cannula  Additional Equipment:   Intra-op Plan:   Post-operative Plan:   Informed Consent: I have reviewed the patients History and Physical, chart, labs and discussed the procedure including the risks, benefits and alternatives for the proposed anesthesia with the patient or authorized representative who has indicated his/her understanding and acceptance.     Dental Advisory Given  Plan Discussed with: CRNA, Anesthesiologist and Surgeon  Anesthesia Plan Comments:        Anesthesia Quick Evaluation

## 2018-12-14 NOTE — CV Procedure (Signed)
Propofol Anesthesia  EF 30-35% inferior wall akinesis Mild MR No LAA thrombus mild LAE No ASD/PFO Normal RV Normal AV Mild TR No effusion Normal AV  Jenkins Rouge

## 2018-12-15 ENCOUNTER — Ambulatory Visit (HOSPITAL_COMMUNITY): Payer: Medicaid Other | Admitting: Certified Registered Nurse Anesthetist

## 2018-12-15 ENCOUNTER — Other Ambulatory Visit: Payer: Self-pay

## 2018-12-15 ENCOUNTER — Encounter (HOSPITAL_COMMUNITY): Payer: Self-pay | Admitting: Certified Registered Nurse Anesthetist

## 2018-12-15 ENCOUNTER — Ambulatory Visit (HOSPITAL_COMMUNITY)
Admission: RE | Admit: 2018-12-15 | Discharge: 2018-12-15 | Disposition: A | Discharge status: Home / Self Care | Payer: Medicaid Other | Attending: Internal Medicine | Admitting: Internal Medicine

## 2018-12-15 ENCOUNTER — Telehealth: Payer: Self-pay

## 2018-12-15 ENCOUNTER — Encounter (HOSPITAL_COMMUNITY)
Admission: RE | Disposition: A | Discharge status: Home / Self Care | Payer: Self-pay | Source: Home / Self Care | Attending: Internal Medicine

## 2018-12-15 DIAGNOSIS — Z95810 Presence of automatic (implantable) cardiac defibrillator: Secondary | ICD-10-CM | POA: Diagnosis not present

## 2018-12-15 DIAGNOSIS — E282 Polycystic ovarian syndrome: Secondary | ICD-10-CM | POA: Diagnosis not present

## 2018-12-15 DIAGNOSIS — I480 Paroxysmal atrial fibrillation: Secondary | ICD-10-CM | POA: Diagnosis not present

## 2018-12-15 DIAGNOSIS — Z7901 Long term (current) use of anticoagulants: Secondary | ICD-10-CM | POA: Insufficient documentation

## 2018-12-15 DIAGNOSIS — Z95828 Presence of other vascular implants and grafts: Secondary | ICD-10-CM | POA: Diagnosis not present

## 2018-12-15 DIAGNOSIS — I509 Heart failure, unspecified: Secondary | ICD-10-CM | POA: Diagnosis not present

## 2018-12-15 DIAGNOSIS — Z882 Allergy status to sulfonamides status: Secondary | ICD-10-CM | POA: Diagnosis not present

## 2018-12-15 DIAGNOSIS — F17200 Nicotine dependence, unspecified, uncomplicated: Secondary | ICD-10-CM | POA: Insufficient documentation

## 2018-12-15 DIAGNOSIS — I428 Other cardiomyopathies: Secondary | ICD-10-CM | POA: Insufficient documentation

## 2018-12-15 DIAGNOSIS — I2510 Atherosclerotic heart disease of native coronary artery without angina pectoris: Secondary | ICD-10-CM | POA: Diagnosis not present

## 2018-12-15 DIAGNOSIS — I4891 Unspecified atrial fibrillation: Secondary | ICD-10-CM | POA: Diagnosis not present

## 2018-12-15 DIAGNOSIS — E1151 Type 2 diabetes mellitus with diabetic peripheral angiopathy without gangrene: Secondary | ICD-10-CM | POA: Insufficient documentation

## 2018-12-15 DIAGNOSIS — Z79899 Other long term (current) drug therapy: Secondary | ICD-10-CM | POA: Diagnosis not present

## 2018-12-15 DIAGNOSIS — I5022 Chronic systolic (congestive) heart failure: Secondary | ICD-10-CM | POA: Insufficient documentation

## 2018-12-15 DIAGNOSIS — Z8249 Family history of ischemic heart disease and other diseases of the circulatory system: Secondary | ICD-10-CM | POA: Diagnosis not present

## 2018-12-15 DIAGNOSIS — K449 Diaphragmatic hernia without obstruction or gangrene: Secondary | ICD-10-CM | POA: Diagnosis not present

## 2018-12-15 HISTORY — PX: ATRIAL FIBRILLATION ABLATION: EP1191

## 2018-12-15 LAB — PREGNANCY, URINE: Preg Test, Ur: NEGATIVE

## 2018-12-15 SURGERY — ATRIAL FIBRILLATION ABLATION
Anesthesia: General

## 2018-12-15 MED ORDER — BUPIVACAINE HCL (PF) 0.25 % IJ SOLN
INTRAMUSCULAR | Status: AC
  Filled 2018-12-15: qty 30

## 2018-12-15 MED ORDER — HEPARIN SODIUM (PORCINE) 1000 UNIT/ML IJ SOLN
INTRAMUSCULAR | Status: DC | PRN
  Administered 2018-12-15: 12000 [IU] via INTRAVENOUS
  Administered 2018-12-15: 1000 [IU] via INTRAVENOUS

## 2018-12-15 MED ORDER — LIDOCAINE 2% (20 MG/ML) 5 ML SYRINGE
INTRAMUSCULAR | Status: DC | PRN
  Administered 2018-12-15: 60 mg via INTRAVENOUS

## 2018-12-15 MED ORDER — PROTAMINE SULFATE 10 MG/ML IV SOLN
INTRAVENOUS | Status: DC | PRN
  Administered 2018-12-15: 40 mg via INTRAVENOUS

## 2018-12-15 MED ORDER — LISINOPRIL 2.5 MG PO TABS: 2.5000 mg | ORAL_TABLET | Freq: Every day | ORAL | Status: DC

## 2018-12-15 MED ORDER — SODIUM CHLORIDE 0.9% FLUSH: 3.0000 mL | INTRAVENOUS | Status: DC | PRN

## 2018-12-15 MED ORDER — SODIUM CHLORIDE 0.9% FLUSH: 3.0000 mL | Freq: Two times a day (BID) | INTRAVENOUS | Status: DC

## 2018-12-15 MED ORDER — DULOXETINE HCL 20 MG PO CPEP
20.0000 mg | ORAL_CAPSULE | Freq: Two times a day (BID) | ORAL | Status: DC
  Filled 2018-12-15 (×2): qty 1

## 2018-12-15 MED ORDER — SODIUM CHLORIDE 0.9 % IV SOLN
INTRAVENOUS | Status: DC
  Administered 2018-12-15: 09:00:00 via INTRAVENOUS

## 2018-12-15 MED ORDER — ACETAMINOPHEN 325 MG PO TABS: 650.0000 mg | ORAL_TABLET | ORAL | Status: DC | PRN

## 2018-12-15 MED ORDER — ROCURONIUM BROMIDE 50 MG/5ML IV SOSY
PREFILLED_SYRINGE | INTRAVENOUS | Status: DC | PRN
  Administered 2018-12-15: 70 mg via INTRAVENOUS

## 2018-12-15 MED ORDER — SODIUM CHLORIDE 0.9 % IV SOLN
INTRAVENOUS | Status: DC | PRN
  Administered 2018-12-15: 20 ug/min via INTRAVENOUS

## 2018-12-15 MED ORDER — DIPHENHYDRAMINE HCL 50 MG/ML IJ SOLN
INTRAMUSCULAR | Status: AC
  Filled 2018-12-15: qty 1

## 2018-12-15 MED ORDER — ONDANSETRON HCL 4 MG/2ML IJ SOLN
INTRAMUSCULAR | Status: DC | PRN
  Administered 2018-12-15 (×2): 4 mg via INTRAVENOUS

## 2018-12-15 MED ORDER — HEPARIN (PORCINE) IN NACL 1000-0.9 UT/500ML-% IV SOLN
INTRAVENOUS | Status: DC | PRN
  Administered 2018-12-15: 500 mL

## 2018-12-15 MED ORDER — HEPARIN (PORCINE) IN NACL 1000-0.9 UT/500ML-% IV SOLN
INTRAVENOUS | Status: AC
  Filled 2018-12-15: qty 500

## 2018-12-15 MED ORDER — RIVAROXABAN 20 MG PO TABS
20.0000 mg | ORAL_TABLET | Freq: Every day | ORAL | Status: DC
  Administered 2018-12-15: 20 mg via ORAL
  Filled 2018-12-15: qty 1

## 2018-12-15 MED ORDER — MIDAZOLAM HCL 5 MG/5ML IJ SOLN
INTRAMUSCULAR | Status: DC | PRN
  Administered 2018-12-15: 2 mg via INTRAVENOUS

## 2018-12-15 MED ORDER — PANTOPRAZOLE SODIUM 40 MG PO TBEC: 40.0000 mg | DELAYED_RELEASE_TABLET | Freq: Every day | ORAL | 0 refills | Status: DC

## 2018-12-15 MED ORDER — FENTANYL CITRATE (PF) 100 MCG/2ML IJ SOLN
INTRAMUSCULAR | Status: DC | PRN
  Administered 2018-12-15 (×3): 50 ug via INTRAVENOUS

## 2018-12-15 MED ORDER — HEPARIN SODIUM (PORCINE) 1000 UNIT/ML IJ SOLN
INTRAMUSCULAR | Status: AC
  Filled 2018-12-15: qty 1

## 2018-12-15 MED ORDER — ACETAMINOPHEN 500 MG PO TABS
1000.0000 mg | ORAL_TABLET | Freq: Once | ORAL | Status: AC
  Administered 2018-12-15: 1000 mg via ORAL
  Filled 2018-12-15: qty 2

## 2018-12-15 MED ORDER — PROPOFOL 10 MG/ML IV BOLUS
INTRAVENOUS | Status: DC | PRN
  Administered 2018-12-15: 120 mg via INTRAVENOUS
  Administered 2018-12-15: 30 mg via INTRAVENOUS

## 2018-12-15 MED ORDER — SODIUM CHLORIDE 0.9 % IV SOLN: 250.0000 mL | INTRAVENOUS | Status: DC | PRN

## 2018-12-15 MED ORDER — DEXAMETHASONE SODIUM PHOSPHATE 10 MG/ML IJ SOLN
INTRAMUSCULAR | Status: DC | PRN
  Administered 2018-12-15: 4 mg via INTRAVENOUS

## 2018-12-15 MED ORDER — SUGAMMADEX SODIUM 200 MG/2ML IV SOLN
INTRAVENOUS | Status: DC | PRN
  Administered 2018-12-15: 200 mg via INTRAVENOUS

## 2018-12-15 MED ORDER — HEPARIN SODIUM (PORCINE) 1000 UNIT/ML IJ SOLN
INTRAMUSCULAR | Status: DC | PRN
  Administered 2018-12-15 (×2): 2000 [IU] via INTRAVENOUS

## 2018-12-15 MED ORDER — BUPIVACAINE HCL (PF) 0.25 % IJ SOLN
INTRAMUSCULAR | Status: DC | PRN
  Administered 2018-12-15: 30 mL

## 2018-12-15 MED ORDER — DIPHENHYDRAMINE HCL 50 MG/ML IJ SOLN
INTRAMUSCULAR | Status: DC | PRN
  Administered 2018-12-15: 12.5 mg via INTRAVENOUS

## 2018-12-15 MED ORDER — ONDANSETRON HCL 4 MG/2ML IJ SOLN: 4.0000 mg | Freq: Four times a day (QID) | INTRAMUSCULAR | Status: DC | PRN

## 2018-12-15 MED ORDER — ACETAMINOPHEN 500 MG PO TABS
ORAL_TABLET | ORAL | Status: AC
  Filled 2018-12-15: qty 2

## 2018-12-15 MED ORDER — HYDROCODONE-ACETAMINOPHEN 5-325 MG PO TABS
1.0000 | ORAL_TABLET | ORAL | Status: DC | PRN
  Administered 2018-12-15: 2 via ORAL
  Filled 2018-12-15: qty 2

## 2018-12-15 SURGICAL SUPPLY — 17 items
BLANKET WARM UNDERBOD FULL ACC (MISCELLANEOUS) ×3 IMPLANT
CATH MAPPNG PENTARAY F 2-6-2MM (CATHETERS) IMPLANT
CATH SMTCH THERMOCOOL SF DF (CATHETERS) ×2 IMPLANT
CATH SOUNDSTAR ECO REPROCESSED (CATHETERS) ×2 IMPLANT
CATH WEBSTER BI DIR CS D-F CRV (CATHETERS) ×2 IMPLANT
COVER SWIFTLINK CONNECTOR (BAG) ×3 IMPLANT
NDL BAYLIS TRANSSEPTAL 71CM (NEEDLE) IMPLANT
NEEDLE BAYLIS TRANSSEPTAL 71CM (NEEDLE) ×3 IMPLANT
PACK EP LATEX FREE (CUSTOM PROCEDURE TRAY) ×2
PACK EP LF (CUSTOM PROCEDURE TRAY) ×1 IMPLANT
PAD PRO RADIOLUCENT 2001M-C (PAD) ×3 IMPLANT
PATCH CARTO3 (PAD) ×2 IMPLANT
PENTARAY F 2-6-2MM (CATHETERS) ×3
SHEATH AVANTI 11F 11CM (SHEATH) ×2 IMPLANT
SHEATH PINNACLE 7F 10CM (SHEATH) ×4 IMPLANT
SHEATH SWARTZ TS SL2 63CM 8.5F (SHEATH) ×2 IMPLANT
TUBING SMART ABLATE COOLFLOW (TUBING) ×2 IMPLANT

## 2018-12-15 NOTE — Transfer of Care (Signed)
Immediate Anesthesia Transfer of Care Note  Patient: Diana Moreno  Procedure(s) Performed: ATRIAL FIBRILLATION ABLATION (N/A )  Patient Location: PACU  Anesthesia Type:General  Level of Consciousness: awake, alert  and oriented  Airway & Oxygen Therapy: Patient Spontanous Breathing and Patient connected to nasal cannula oxygen  Post-op Assessment: Report given to RN and Post -op Vital signs reviewed and stable  Post vital signs: Reviewed and stable  Last Vitals:  Vitals Value Taken Time  BP 112/56 12/15/18 1330  Temp 36.7 C 12/15/18 1319  Pulse 61 12/15/18 1330  Resp 15 12/15/18 1330  SpO2 91 % 12/15/18 1330  Vitals shown include unvalidated device data.  Last Pain:  Vitals:   12/15/18 1319  TempSrc: Temporal  PainSc: 0-No pain         Complications: No apparent anesthesia complications

## 2018-12-15 NOTE — Anesthesia Postprocedure Evaluation (Signed)
Anesthesia Post Note  Patient: Diana Moreno  Procedure(s) Performed: ATRIAL FIBRILLATION ABLATION (N/A )     Patient location during evaluation: PACU Anesthesia Type: General Level of consciousness: sedated Pain management: pain level controlled Vital Signs Assessment: post-procedure vital signs reviewed and stable Respiratory status: spontaneous breathing and respiratory function stable Cardiovascular status: stable Postop Assessment: no apparent nausea or vomiting Anesthetic complications: no    Last Vitals:  Vitals:   12/15/18 1415 12/15/18 1446  BP: (!) 103/46 96/66  Pulse: (!) 57   Resp: 14 16  Temp:    SpO2: 95%     Last Pain:  Vitals:   12/15/18 1446  TempSrc:   PainSc: 0-No pain                 Euretha Najarro DANIEL

## 2018-12-15 NOTE — Progress Notes (Signed)
Beta Blocker held per Dr Jackalyn Lombard instructions

## 2018-12-15 NOTE — Progress Notes (Signed)
No further bleeding noted from right groin.  Pt ambulated to bathroom without difficulty.  Right groin dressing clean, dry, and intact upon return to bed.  Patient calling a friend to get a ride home.  IV discontinued.  Telemetry monitor discontinued, and monitor tech notified.  Diana Moreno

## 2018-12-15 NOTE — Discharge Instructions (Signed)
Post procedure care instructions No driving for 4 days. No lifting over 5 lbs for 1 week. No vigorous or sexual activity for 1 week. You may return to work on 12/22/2018. Keep procedure site clean & dry. If you notice increased pain, swelling, bleeding or pus, call/return!  You may shower, but no soaking baths/hot tubs/pools for 1 week.

## 2018-12-15 NOTE — Anesthesia Postprocedure Evaluation (Signed)
Anesthesia Post Note  Patient: Diana Moreno  Procedure(s) Performed: TRANSESOPHAGEAL ECHOCARDIOGRAM (TEE) (N/A )     Patient location during evaluation: Endoscopy Anesthesia Type: MAC Level of consciousness: awake and alert Pain management: pain level controlled Vital Signs Assessment: post-procedure vital signs reviewed and stable Respiratory status: spontaneous breathing, nonlabored ventilation, respiratory function stable and patient connected to nasal cannula oxygen Cardiovascular status: blood pressure returned to baseline and stable Postop Assessment: no apparent nausea or vomiting Anesthetic complications: no    Last Vitals:  Vitals:   12/14/18 0943 12/14/18 0955  BP: (!) 90/55 (!) 98/47  Pulse:  (!) 50  Resp: 14 16  Temp:    SpO2: 100% 100%    Last Pain:  Vitals:   12/14/18 0955  TempSrc:   PainSc: 0-No pain                 Amarian Botero S

## 2018-12-15 NOTE — Discharge Summary (Signed)
ELECTROPHYSIOLOGY PROCEDURE DISCHARGE SUMMARY    Patient ID: Diana Moreno,  MRN: [id number], DOB/AGE: Sep 22, 1975 43 y.o.  Admit date: 12/15/2018 Discharge date: 12/15/2018  Electrophysiologist: Dr Graciela Husbands  Primary Discharge Diagnosis:  Paroxysmal atrial fibrillation  Secondary Discharge Diagnosis:  ICD shocks (inappropriate) due to afib with RVR previously Nonischemic CM  Procedures This Admission:  1.  Electrophysiology study and radiofrequency catheter ablation on 12/15/2018 by Dr Hillis Range.  This study demonstrated successful PVI of all four PVs.    Brief HPI: Diana Moreno is a 43 y.o. female with a history of paroxysmal atrial fibrillation.  She has had prior inappropriate shocks from her ICD for afib with RVR. She has been placed on amiodarone.  There are concerns for side effects of chronic use of this medicine.  Risks, benefits, and alternatives to catheter ablation of atrial fibrillation were reviewed with the patient who wished to proceed.  The patient underwent TEE prior to the procedure (12/14/2018) which demonstrated normal LV function and no LAA thrombus.    Hospital Course:  The patient was admitted and underwent EPS/RFCA of atrial fibrillation with details as outlined above.  They were monitored on telemetry which demonstrated sinus rhythm.  Groin was without complication on the day of discharge.  The patient was examined and considered to be stable for discharge.  Wound care and restrictions were reviewed with the patient.  The patient will be seen back by Rudi Coco, NP in 4 weeks and Dr Johney Frame in 12 weeks for post ablation follow up. Plan will be to reduce amiadorone to 100mg  daily in 4 weeks.  This patients CHA2DS2-VASc Score and unadjusted Ischemic Stroke Rate (% per year) is equal to 3.2 % stroke rate/year from a score of 3 Above score calculated as 1 point each if present [CHF, HTN, DM, Vascular=MI/PAD/Aortic Plaque, Age if 65-74, or Female] Above  score calculated as 2 points each if present [Age > 75, or Stroke/TIA/TE]   Physical Exam: Vitals:   12/15/18 1616 12/15/18 1646 12/15/18 1716 12/15/18 1816  BP: 94/63 95/63 113/71 106/71  Pulse:      Resp: 15 15 14 13   Temp:      TempSrc:      SpO2:      Weight:      Height:        GEN- The patient is well appearing, alert and oriented x 3 today.   HEENT: normocephalic, atraumatic; sclera clear, conjunctiva pink; hearing intact; oropharynx clear; neck supple  Lungs- Clear to ausculation bilaterally, normal work of breathing.  No wheezes, rales, rhonchi Heart- Regular rate and rhythm, no murmurs, rubs or gallops  GI- soft, non-tender, non-distended, bowel sounds present  Extremities- no clubbing, cyanosis, or edema; DP/PT/radial pulses 2+ bilaterally, groin without hematoma/bruit MS- no significant deformity or atrophy Skin- warm and dry, no rash or lesion Psych- euthymic mood, full affect Neuro- strength and sensation are intact   Labs:   Lab Results  Component Value Date   WBC 13.1 (H) 12/11/2018   HGB 14.0 12/11/2018   HCT 41.7 12/11/2018   MCV 93 12/11/2018   PLT 291 12/11/2018    Recent Labs  Lab 12/11/18 1359  NA 141  K 4.5  CL 108*  CO2 18*  BUN 9  CREATININE 0.80  CALCIUM 9.0  GLUCOSE 88     Discharge Medications:  Allergies as of 12/15/2018      Reactions   Sulfa Antibiotics Hives   Sulfur Itching   Actually  a Sulfonamide antibiotic allergy with Hives reaction       Medication List    TAKE these medications   acetaminophen 500 MG tablet Commonly known as: TYLENOL Take 1,000 mg by mouth every 6 (six) hours as needed for pain or fever.   amiodarone 200 MG tablet Commonly known as: PACERONE Take 200 mg by mouth daily.   atorvastatin 40 MG tablet Commonly known as: LIPITOR Take 1 tablet (40 mg total) by mouth daily at 6 PM.   carvedilol 3.125 MG tablet Commonly known as: COREG TAKE 1 TABLET BY MOUTH 2 TIMES DAILY WITH A MEAL. PLEASE  KEEP UPCOMING APPT IN AUGUST WITH DR. ROSS   DULoxetine 20 MG capsule Commonly known as: CYMBALTA TAKE 1 CAPSULE BY MOUTH TWICE A DAY   hydrOXYzine 25 MG tablet Commonly known as: ATARAX/VISTARIL TAKE 1 TABLET (25 MG TOTAL) BY MOUTH EVERY 6 (SIX) HOURS AS NEEDED (FOR ANXIETY FROM DEFIBRILLATOR).   lisinopril 2.5 MG tablet Commonly known as: ZESTRIL TAKE 1 TABLET BY MOUTH EVERY DAY   metoprolol succinate 25 MG 24 hr tablet Commonly known as: TOPROL-XL Take 1 tablet (25 mg total) by mouth 2 (two) times daily.   pantoprazole 40 MG tablet Commonly known as: Protonix Take 1 tablet (40 mg total) by mouth daily.   rivaroxaban 20 MG Tabs tablet Commonly known as: XARELTO Take 1 tablet (20 mg total) by mouth daily with supper.       Disposition:   Follow-up Information    [facility] Follow up.   Specialty: Cardiology Why: 01/18/2019 @ 1:30PM Contact information: [id number] [place] [address] [phone]       Thompson Grayer, MD Follow up.   Specialty: Cardiology Why: 03/25/2019 @ 2:00PM Contact information: [place] [address] [phone]           Duration of Discharge Encounter: Greater than 30 minutes including physician time.  Army Fossa MD 12/15/2018 7:22 PM

## 2018-12-15 NOTE — Progress Notes (Addendum)
Rt groin 3-way stopcock removed, figure 8 stitch cut and removed, light pressure x 5 minutes to rt groin. Level 0. Gauze and tegaderm dressing. IV saline locked

## 2018-12-15 NOTE — Progress Notes (Signed)
Right groin bleeding and bruising noted.  Pressure held x 10 minutes and bleeding resolved.  Cecilie Kicks, NP notified.  Per her orders, will observe for an hour and D/C home if no further bleeding.  Will continue to monitor.  Jodell Cipro

## 2018-12-15 NOTE — Telephone Encounter (Signed)
Left message for patient to remind of missed remote transmission.  
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
## 2018-12-15 NOTE — Anesthesia Preprocedure Evaluation (Addendum)
Anesthesia Evaluation  Patient identified by MRN, date of birth, ID band Patient awake    Reviewed: Allergy & Precautions, H&P , NPO status , Patient's Chart, lab work & pertinent test results, reviewed documented beta blocker date and time   History of Anesthesia Complications Negative for: history of anesthetic complications  Airway Mallampati: II  TM Distance: >3 FB Neck ROM: full    Dental  (+) Dental Advidsory Given   Pulmonary Current Smoker,    breath sounds clear to auscultation       Cardiovascular + CAD, + Peripheral Vascular Disease and +CHF  + Cardiac Defibrillator  Rhythm:regular Rate:Normal  IMPRESSIONS  1. The left ventricle has moderate-severely reduced systolic function, with an ejection fraction of 30-35%. The cavity size was mildly dilated. Left ventricular diastolic function could not be evaluated.  2. Inferior wall akinesis.  3. The right ventricle has normal systolc function. The cavity was normal. There is no increase in right ventricular wall thickness.  4. Left atrial size was mildly dilated.  5. No LAA thrombus.  6. The aortic valve is tricuspid.  7. Pulmonic valve regurgitation is mild by color flow Doppler.  8. The aortic root is normal in size and structure.    Neuro/Psych PSYCHIATRIC DISORDERS Anxiety Depression Bipolar Disorder    GI/Hepatic hiatal hernia,   Endo/Other    Renal/GU      Musculoskeletal   Abdominal   Peds  Hematology   Anesthesia Other Findings   Reproductive/Obstetrics                             Anesthesia Physical  Anesthesia Plan  ASA: III  Anesthesia Plan: General   Post-op Pain Management:    Induction: Intravenous  PONV Risk Score and Plan: 2 and Ondansetron, Treatment may vary due to age or medical condition and Dexamethasone  Airway Management Planned: LMA and Oral ETT  Additional Equipment:   Intra-op Plan:    Post-operative Plan: Extubation in OR  Informed Consent: I have reviewed the patients History and Physical, chart, labs and discussed the procedure including the risks, benefits and alternatives for the proposed anesthesia with the patient or authorized representative who has indicated his/her understanding and acceptance.     Dental Advisory Given  Plan Discussed with: CRNA, Anesthesiologist and Surgeon  Anesthesia Plan Comments:       Anesthesia Quick Evaluation

## 2018-12-15 NOTE — Anesthesia Procedure Notes (Signed)
Procedure Name: Intubation Date/Time: 12/15/2018 10:50 AM Performed by: Inda Coke, CRNA Pre-anesthesia Checklist: Patient identified, Emergency Drugs available, Suction available and Patient being monitored Patient Re-evaluated:Patient Re-evaluated prior to induction Oxygen Delivery Method: Circle System Utilized Preoxygenation: Pre-oxygenation with 100% oxygen Induction Type: IV induction Ventilation: Mask ventilation without difficulty Laryngoscope Size: Mac and 4 Grade View: Grade I Tube type: Oral Tube size: 7.0 mm Number of attempts: 1 Airway Equipment and Method: Stylet and Oral airway Placement Confirmation: ETT inserted through vocal cords under direct vision,  positive ETCO2 and breath sounds checked- equal and bilateral Secured at: 22 cm Tube secured with: Tape Dental Injury: Teeth and Oropharynx as per pre-operative assessment

## 2018-12-16 ENCOUNTER — Encounter (HOSPITAL_COMMUNITY): Payer: Self-pay | Admitting: Internal Medicine

## 2018-12-16 DIAGNOSIS — H5203 Hypermetropia, bilateral: Secondary | ICD-10-CM | POA: Diagnosis not present

## 2018-12-16 LAB — CUP PACEART REMOTE DEVICE CHECK
Battery Remaining Longevity: 90 mo
Battery Remaining Percentage: 93 %
Brady Statistic RV Percent Paced: 0 %
Date Time Interrogation Session: 20200729081952
HighPow Impedance: 87 Ohm
Implantable Lead Implant Date: 20150514
Implantable Lead Location: 753860
Implantable Lead Model: 292
Implantable Lead Serial Number: 341088
Implantable Pulse Generator Implant Date: 20150514
Lead Channel Impedance Value: 1278 Ohm
Lead Channel Pacing Threshold Amplitude: 4 V
Lead Channel Pacing Threshold Pulse Width: 2 ms
Lead Channel Setting Pacing Amplitude: 5.5 V
Lead Channel Setting Pacing Pulse Width: 0.8 ms
Lead Channel Setting Sensing Sensitivity: 0.6 mV
Pulse Gen Serial Number: 115386

## 2018-12-16 LAB — POCT ACTIVATED CLOTTING TIME
Activated Clotting Time: 384 seconds
Activated Clotting Time: 389 seconds

## 2018-12-17 ENCOUNTER — Other Ambulatory Visit: Payer: Self-pay | Admitting: Internal Medicine

## 2018-12-17 MED ORDER — METOPROLOL SUCCINATE ER 25 MG PO TB24: 25.0000 mg | ORAL_TABLET | Freq: Two times a day (BID) | ORAL | 3 refills | Status: DC

## 2018-12-17 NOTE — Telephone Encounter (Signed)
Pt's medication was sent to pt's pharmacy as requested. Confirmation received.  °

## 2018-12-25 ENCOUNTER — Telehealth: Payer: Self-pay

## 2018-12-25 ENCOUNTER — Other Ambulatory Visit: Payer: Self-pay

## 2018-12-25 MED ORDER — AMIODARONE HCL 200 MG PO TABS: 200.0000 mg | ORAL_TABLET | Freq: Every day | ORAL | 2 refills | Status: DC

## 2018-12-25 NOTE — Telephone Encounter (Signed)
Entered in error

## 2018-12-30 ENCOUNTER — Telehealth: Payer: Self-pay

## 2018-12-30 NOTE — Telephone Encounter (Signed)
Spoke with pt regarding her upcoming appt with Dr Caryl Comes. This appt was made prior to her AF Ablation with Dr Rayann Heman in July. Pt is scheduled to see AF clinic 8/31 and Dr Rayann Heman in Nov. Pt has agreed to postpone her visit until her routine device follow up in Feb, unless she needs [place] sooner. She agrees with plan and had no additional questions.

## 2019-01-01 ENCOUNTER — Encounter: Payer: Self-pay | Admitting: Cardiology

## 2019-01-01 NOTE — Progress Notes (Signed)
Remote ICD transmission.   

## 2019-01-04 ENCOUNTER — Encounter: Payer: Medicaid Other | Admitting: Internal Medicine

## 2019-01-08 ENCOUNTER — Other Ambulatory Visit: Payer: Self-pay | Admitting: Internal Medicine

## 2019-01-11 NOTE — Telephone Encounter (Signed)
Xarelto 20mg  refill request received; pt is 43 yrs old, wt-83.8kg, Crea-0.80 on 12/11/2018, last seen by Dr. Rayann Heman on 11/09/2018 via Telemedicine visit, Diagnosis-Afib, CrCl-124ml/min; will send in refill.

## 2019-01-11 NOTE — Telephone Encounter (Signed)
She should take it for 6 weeks post ablation. Ok to fill for that duration.  Her ablation was at the end of July.

## 2019-01-18 ENCOUNTER — Other Ambulatory Visit: Payer: Self-pay

## 2019-01-18 ENCOUNTER — Ambulatory Visit (HOSPITAL_COMMUNITY)
Admission: RE | Admit: 2019-01-18 | Discharge: 2019-01-18 | Disposition: A | Discharge status: Home / Self Care | Payer: Medicaid Other | Source: Ambulatory Visit | Attending: Physician Assistant | Admitting: Physician Assistant

## 2019-01-18 ENCOUNTER — Encounter (HOSPITAL_COMMUNITY): Payer: Self-pay | Admitting: Physician Assistant

## 2019-01-18 VITALS — BP 140/76 | HR 57 | Ht 64.0 in | Wt 186.2 lb

## 2019-01-18 DIAGNOSIS — Z6831 Body mass index (BMI) 31.0-31.9, adult: Secondary | ICD-10-CM | POA: Diagnosis not present

## 2019-01-18 DIAGNOSIS — F17210 Nicotine dependence, cigarettes, uncomplicated: Secondary | ICD-10-CM | POA: Diagnosis not present

## 2019-01-18 DIAGNOSIS — I428 Other cardiomyopathies: Secondary | ICD-10-CM | POA: Insufficient documentation

## 2019-01-18 DIAGNOSIS — Z79899 Other long term (current) drug therapy: Secondary | ICD-10-CM | POA: Insufficient documentation

## 2019-01-18 DIAGNOSIS — Z95810 Presence of automatic (implantable) cardiac defibrillator: Secondary | ICD-10-CM | POA: Insufficient documentation

## 2019-01-18 DIAGNOSIS — Z882 Allergy status to sulfonamides status: Secondary | ICD-10-CM | POA: Diagnosis not present

## 2019-01-18 DIAGNOSIS — I480 Paroxysmal atrial fibrillation: Secondary | ICD-10-CM | POA: Diagnosis not present

## 2019-01-18 DIAGNOSIS — I2510 Atherosclerotic heart disease of native coronary artery without angina pectoris: Secondary | ICD-10-CM | POA: Diagnosis not present

## 2019-01-18 DIAGNOSIS — I472 Ventricular tachycardia: Secondary | ICD-10-CM | POA: Diagnosis not present

## 2019-01-18 DIAGNOSIS — Z7901 Long term (current) use of anticoagulants: Secondary | ICD-10-CM | POA: Insufficient documentation

## 2019-01-18 DIAGNOSIS — I739 Peripheral vascular disease, unspecified: Secondary | ICD-10-CM | POA: Diagnosis not present

## 2019-01-18 DIAGNOSIS — E669 Obesity, unspecified: Secondary | ICD-10-CM | POA: Insufficient documentation

## 2019-01-18 DIAGNOSIS — I5022 Chronic systolic (congestive) heart failure: Secondary | ICD-10-CM | POA: Diagnosis not present

## 2019-01-18 DIAGNOSIS — Z7689 Persons encountering health services in other specified circumstances: Secondary | ICD-10-CM | POA: Diagnosis not present

## 2019-01-18 MED ORDER — LISINOPRIL 2.5 MG PO TABS: 2.5000 mg | ORAL_TABLET | Freq: Every day | ORAL | 3 refills | Status: DC

## 2019-01-18 MED ORDER — AMIODARONE HCL 200 MG PO TABS: 100.0000 mg | ORAL_TABLET | Freq: Every day | ORAL | 2 refills | Status: DC
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
## 2019-02-14 ENCOUNTER — Other Ambulatory Visit: Payer: Self-pay | Admitting: Family Medicine

## 2019-03-16 ENCOUNTER — Encounter: Payer: Medicaid Other | Admitting: *Deleted

## 2019-03-25 ENCOUNTER — Encounter: Payer: Medicaid Other | Admitting: Internal Medicine

## 2019-03-26 ENCOUNTER — Other Ambulatory Visit: Payer: Self-pay | Admitting: Internal Medicine

## 2019-03-26 NOTE — Telephone Encounter (Signed)
Pt last saw Clint Fenton, PA on 01/18/19, last labs 12/11/18 Creat 0.80, age 43, weight 84.5kg, CrCl 122.2, based on CrCl pt is on appropriate dosage of Xarelto 20mg  QD.  Will refill rx.

## 2019-03-30 ENCOUNTER — Other Ambulatory Visit: Payer: Self-pay | Admitting: Internal Medicine

## 2019-03-30 ENCOUNTER — Other Ambulatory Visit: Payer: Self-pay | Admitting: Family Medicine

## 2019-05-19 ENCOUNTER — Other Ambulatory Visit: Payer: Self-pay | Admitting: *Deleted

## 2019-05-19 ENCOUNTER — Other Ambulatory Visit: Payer: Self-pay | Admitting: Internal Medicine

## 2019-05-25 MED ORDER — DULOXETINE HCL 20 MG PO CPEP: 20.0000 mg | ORAL_CAPSULE | Freq: Two times a day (BID) | ORAL | 2 refills | Status: DC

## 2019-05-25 NOTE — Telephone Encounter (Signed)
It doesn't look like i've seen this patient in a year.  Can we schedule her an appointment with me?

## 2019-05-26 NOTE — Telephone Encounter (Signed)
LVM to call office back.  If she calls back please assist her in getting an appointment scheduled. Kaija Kovacevic Zimmerman Rumple, CMA

## 2019-06-15 ENCOUNTER — Ambulatory Visit (INDEPENDENT_AMBULATORY_CARE_PROVIDER_SITE_OTHER): Payer: Medicaid Other | Admitting: *Deleted

## 2019-06-15 DIAGNOSIS — I428 Other cardiomyopathies: Secondary | ICD-10-CM

## 2019-06-17 LAB — CUP PACEART REMOTE DEVICE CHECK
Battery Remaining Longevity: 84 mo
Battery Remaining Percentage: 87 %
Brady Statistic RV Percent Paced: 0 %
Date Time Interrogation Session: 20210128225200
HighPow Impedance: 92 Ohm
Implantable Lead Implant Date: 20150514
Implantable Lead Location: 753860
Implantable Lead Model: 292
Implantable Lead Serial Number: 341088
Implantable Pulse Generator Implant Date: 20150514
Lead Channel Impedance Value: 1528 Ohm
Lead Channel Pacing Threshold Amplitude: 4 V
Lead Channel Pacing Threshold Pulse Width: 2 ms
Lead Channel Setting Pacing Amplitude: 5.5 V
Lead Channel Setting Pacing Pulse Width: 0.8 ms
Lead Channel Setting Sensing Sensitivity: 0.6 mV
Pulse Gen Serial Number: 115386

## 2019-06-18 ENCOUNTER — Telehealth: Payer: Self-pay | Admitting: Family Medicine

## 2019-06-18 NOTE — Telephone Encounter (Signed)
This patient has not been to our clinic in about a year.  Can we schedule an appt with her?

## 2019-07-05 ENCOUNTER — Encounter: Payer: Self-pay | Admitting: Internal Medicine

## 2019-07-30 ENCOUNTER — Encounter: Payer: Self-pay | Admitting: General Practice

## 2019-08-09 ENCOUNTER — Telehealth: Payer: Self-pay

## 2019-08-09 NOTE — Telephone Encounter (Signed)
ICD alert received-  Pt had 3 treated VT/ VF episodes between 3/18 and 3/19.  Last episode included below.   Current meds include Amiodarone 100mg  Daily, Metoprolol 25gm BID and Xarelto 20mg  daily.  Pt underwent AF ablation in 11/2018.   Spoke with pt, she reports she ahs been ill will with a stomach virus over the weekend.  She has missed some doses of medications due to stomach ailment.  She stated she is going to try to be better about medication compliance.    Pt informed of driving restrictions for 6 months post treated episodes.    Advised pt due to therapies received and rhythm she should be seen this week by Dr. or an APP.  Pt agreeable, will have scheduling contact to set up appt.

## 2019-08-12 ENCOUNTER — Encounter: Payer: Medicaid Other | Admitting: Internal Medicine

## 2019-08-18 ENCOUNTER — Telehealth: Payer: Self-pay | Admitting: Student

## 2019-08-18 ENCOUNTER — Encounter: Payer: Medicaid Other | Admitting: Student

## 2019-08-18 ENCOUNTER — Telehealth: Payer: Self-pay

## 2019-08-18 NOTE — Telephone Encounter (Signed)
Error. Attempting to re-access previous tele note.
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
## 2019-08-18 NOTE — Telephone Encounter (Signed)
Error

## 2019-08-18 NOTE — Telephone Encounter (Signed)
Dr. Johney Frame performed AF ablation, Dr. Graciela Husbands follows for device/VT.

## 2019-08-18 NOTE — Telephone Encounter (Signed)
lpmtcb to reschedule appointment 3/31

## 2019-08-23 ENCOUNTER — Other Ambulatory Visit: Payer: Self-pay

## 2019-08-23 MED ORDER — LISINOPRIL 2.5 MG PO TABS: 2.5000 mg | ORAL_TABLET | Freq: Every day | ORAL | 0 refills | Status: DC

## 2019-08-23 NOTE — Telephone Encounter (Signed)
R  lets plan to talk about this pt whom wyou will be seeing on Friday Thanks SK
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
## 2019-08-27 ENCOUNTER — Encounter: Payer: Medicaid Other | Admitting: Physician Assistant

## 2019-08-31 ENCOUNTER — Ambulatory Visit (INDEPENDENT_AMBULATORY_CARE_PROVIDER_SITE_OTHER): Payer: Medicaid Other | Admitting: Student

## 2019-08-31 ENCOUNTER — Encounter: Payer: Self-pay | Admitting: Student

## 2019-08-31 ENCOUNTER — Other Ambulatory Visit: Payer: Self-pay

## 2019-08-31 VITALS — BP 112/64 | HR 61 | Ht 64.0 in | Wt 171.0 lb

## 2019-08-31 DIAGNOSIS — Z79899 Other long term (current) drug therapy: Secondary | ICD-10-CM

## 2019-08-31 DIAGNOSIS — I472 Ventricular tachycardia, unspecified: Secondary | ICD-10-CM

## 2019-08-31 DIAGNOSIS — I420 Dilated cardiomyopathy: Secondary | ICD-10-CM

## 2019-08-31 DIAGNOSIS — Z95810 Presence of automatic (implantable) cardiac defibrillator: Secondary | ICD-10-CM | POA: Diagnosis not present

## 2019-08-31 DIAGNOSIS — I480 Paroxysmal atrial fibrillation: Secondary | ICD-10-CM

## 2019-08-31 DIAGNOSIS — Z7689 Persons encountering health services in other specified circumstances: Secondary | ICD-10-CM | POA: Diagnosis not present

## 2019-08-31 LAB — CUP PACEART INCLINIC DEVICE CHECK
Date Time Interrogation Session: 20210413134615
HighPow Impedance: 80 Ohm
HighPow Impedance: 83 Ohm
Implantable Lead Implant Date: 20150514
Implantable Lead Location: 753860
Implantable Lead Model: 292
Implantable Lead Serial Number: 341088
Implantable Pulse Generator Implant Date: 20150514
Lead Channel Impedance Value: 1538 Ohm
Lead Channel Pacing Threshold Amplitude: 5.5 V
Lead Channel Pacing Threshold Pulse Width: 1 ms
Lead Channel Sensing Intrinsic Amplitude: 23.2 mV
Lead Channel Setting Pacing Amplitude: 5.5 V
Lead Channel Setting Pacing Pulse Width: 0.8 ms
Lead Channel Setting Sensing Sensitivity: 0.6 mV
Pulse Gen Serial Number: 115386
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
## 2019-08-31 NOTE — Patient Instructions (Addendum)
Medication Instructions:  none *If you need a refill on your cardiac medications before your next appointment, please call your pharmacy*   Lab Work:  TODAY TSH CBC CMET MAGNESIUM If you have labs (blood work) drawn today and your tests are completely normal, you will receive your results only by: Marland Kitchen MyChart Message (if you have MyChart) OR . A paper copy in the mail If you have any lab test that is abnormal or we need to change your treatment, we will call you to review the results.   Testing/Procedures: none   Follow-Up: At [facility], you and your health needs are our priority.  As part of our continuing mission to provide you with exceptional heart care, we have created designated Provider Care Teams.  These Care Teams include your primary Cardiologist (physician) and Advanced Practice Providers (APPs -  Physician Assistants and Nurse Practitioners) who all work together to provide you with the care you need, when you need it.   Your next appointment:   6 MONTHS  The format for your next appointment:   Either In Person or Virtual  Provider:   Dr Graciela Husbands   Other Instructions Remote monitoring is used to monitor your ICD from home. This monitoring reduces the number of office visits required to check your device to one time per year. It allows [place] to keep an eye on the functioning of your device to ensure it is working properly. You are scheduled for a device check from home on 09/14/19. You may send your transmission at any time that day. If you have a wireless device, the transmission will be sent automatically. After your physician reviews your transmission, you will receive a postcard with your next transmission date.

## 2019-09-01 ENCOUNTER — Telehealth: Payer: Self-pay

## 2019-09-01 LAB — COMPREHENSIVE METABOLIC PANEL
ALT: 9 IU/L (ref 0–32)
AST: 11 IU/L (ref 0–40)
Albumin/Globulin Ratio: 1.6 (ref 1.2–2.2)
Albumin: 3.9 g/dL (ref 3.8–4.8)
Alkaline Phosphatase: 113 IU/L (ref 39–117)
BUN/Creatinine Ratio: 12 (ref 9–23)
BUN: 10 mg/dL (ref 6–24)
Bilirubin Total: <0.2 mg/dL (ref 0.0–1.2)
CO2: 20 mmol/L (ref 20–29)
Calcium: 9.3 mg/dL (ref 8.7–10.2)
Chloride: 105 mmol/L (ref 96–106)
Creatinine, Ser: 0.85 mg/dL (ref 0.57–1.00)
GFR calc Af Amer: 97 mL/min/{1.73_m2} (ref >59)
GFR calc non Af Amer: 84 mL/min/{1.73_m2} (ref >59)
Globulin, Total: 2.5 g/dL (ref 1.5–4.5)
Glucose: 99 mg/dL (ref 65–99)
Potassium: 4.7 mmol/L (ref 3.5–5.2)
Sodium: 136 mmol/L (ref 134–144)
Total Protein: 6.4 g/dL (ref 6.0–8.5)

## 2019-09-01 LAB — CBC
Hematocrit: 31 % — ABNORMAL LOW (ref 34.0–46.6)
Hemoglobin: 9.6 g/dL — ABNORMAL LOW (ref 11.1–15.9)
MCH: 23.2 pg — ABNORMAL LOW (ref 26.6–33.0)
MCHC: 31 g/dL — ABNORMAL LOW (ref 31.5–35.7)
MCV: 75 fL — ABNORMAL LOW (ref 79–97)
Platelets: 407 10*3/uL (ref 150–450)
RBC: 4.13 x10E6/uL (ref 3.77–5.28)
RDW: 18.7 % — ABNORMAL HIGH (ref 11.7–15.4)
WBC: 16 10*3/uL — ABNORMAL HIGH (ref 3.4–10.8)

## 2019-09-01 LAB — MAGNESIUM: Magnesium: 2.4 mg/dL — ABNORMAL HIGH (ref 1.6–2.3)

## 2019-09-01 LAB — TSH: TSH: 0.638 u[IU]/mL (ref 0.450–4.500)

## 2019-09-01 NOTE — Telephone Encounter (Signed)
lpmtcb 4/14 

## 2019-09-01 NOTE — Telephone Encounter (Signed)
-----   Message from Graciella Freer, PA-C sent at 09/01/2019  1:26 PM EDT ----- Electrolytes and renal function stable, no clear cause of VT as expected now nearly 1 month out.   Pt does have new anemia with Hgb down 4-5 points from her baseline. Please ask if she has noticed bleeding of any sort and recommend prompt PCP follow up for further evaluation.   Thanks!

## 2019-09-01 NOTE — Telephone Encounter (Signed)
The patient has been notified of the lab result and verbalized understanding.  All questions (if any) were answered. Sigurd Sos, RN 09/01/2019 4:01 PM    I spoke to the patient in regards to her Hgb and she stated that when she gets sick, her Menstrual cycle begins.  The past 1 month she has had 3 cycles.  I encouraged her to eat foods that would help to increase the Hgb level.  She will do that and await further advisement, if needed.  I told her that I would forward results to her PCP.

## 2019-09-01 NOTE — Telephone Encounter (Signed)
-----   Message from Arthuro Canelo Andrew Tillery, PA-C sent at 09/01/2019  1:26 PM EDT ----- Electrolytes and renal function stable, no clear cause of VT as expected now nearly 1 month out.   Pt does have new anemia with Hgb down 4-5 points from her baseline. Please ask if she has noticed bleeding of any sort and recommend prompt PCP follow up for further evaluation.   Thanks! 

## 2019-09-03 ENCOUNTER — Other Ambulatory Visit: Payer: Self-pay

## 2019-09-03 ENCOUNTER — Emergency Department (HOSPITAL_COMMUNITY)
Admission: EM | Admit: 2019-09-03 | Discharge: 2019-09-03 | Disposition: A | Discharge status: Home / Self Care | Payer: Medicaid Other | Attending: Emergency Medicine | Admitting: Emergency Medicine

## 2019-09-03 DIAGNOSIS — R1010 Upper abdominal pain, unspecified: Secondary | ICD-10-CM | POA: Diagnosis not present

## 2019-09-03 DIAGNOSIS — R112 Nausea with vomiting, unspecified: Secondary | ICD-10-CM | POA: Diagnosis not present

## 2019-09-03 DIAGNOSIS — Z5321 Procedure and treatment not carried out due to patient leaving prior to being seen by health care provider: Secondary | ICD-10-CM | POA: Diagnosis not present

## 2019-09-03 DIAGNOSIS — K3189 Other diseases of stomach and duodenum: Secondary | ICD-10-CM | POA: Diagnosis not present

## 2019-09-03 DIAGNOSIS — R1030 Lower abdominal pain, unspecified: Secondary | ICD-10-CM | POA: Diagnosis not present

## 2019-09-03 DIAGNOSIS — K5900 Constipation, unspecified: Secondary | ICD-10-CM | POA: Diagnosis not present

## 2019-09-03 DIAGNOSIS — R110 Nausea: Secondary | ICD-10-CM | POA: Diagnosis not present

## 2019-09-03 DIAGNOSIS — R1110 Vomiting, unspecified: Secondary | ICD-10-CM | POA: Insufficient documentation

## 2019-09-03 DIAGNOSIS — R1111 Vomiting without nausea: Secondary | ICD-10-CM | POA: Diagnosis not present

## 2019-09-03 DIAGNOSIS — R1084 Generalized abdominal pain: Secondary | ICD-10-CM | POA: Diagnosis not present

## 2019-09-03 DIAGNOSIS — K449 Diaphragmatic hernia without obstruction or gangrene: Secondary | ICD-10-CM | POA: Diagnosis not present

## 2019-09-03 DIAGNOSIS — E278 Other specified disorders of adrenal gland: Secondary | ICD-10-CM | POA: Diagnosis not present

## 2019-09-03 DIAGNOSIS — N390 Urinary tract infection, site not specified: Secondary | ICD-10-CM | POA: Diagnosis not present

## 2019-09-03 DIAGNOSIS — K228 Other specified diseases of esophagus: Secondary | ICD-10-CM | POA: Diagnosis not present

## 2019-09-03 LAB — CBC
HCT: 33.4 % — ABNORMAL LOW (ref 36.0–46.0)
Hemoglobin: 10 g/dL — ABNORMAL LOW (ref 12.0–15.0)
MCH: 22.5 pg — ABNORMAL LOW (ref 26.0–34.0)
MCHC: 29.9 g/dL — ABNORMAL LOW (ref 30.0–36.0)
MCV: 75.2 fL — ABNORMAL LOW (ref 80.0–100.0)
Platelets: 435 10*3/uL — ABNORMAL HIGH (ref 150–400)
RBC: 4.44 MIL/uL (ref 3.87–5.11)
RDW: 20.6 % — ABNORMAL HIGH (ref 11.5–15.5)
WBC: 15.2 10*3/uL — ABNORMAL HIGH (ref 4.0–10.5)
nRBC: 0 % (ref 0.0–0.2)

## 2019-09-03 LAB — COMPREHENSIVE METABOLIC PANEL
ALT: 14 U/L (ref 0–44)
AST: 18 U/L (ref 15–41)
Albumin: 4.3 g/dL (ref 3.5–5.0)
Alkaline Phosphatase: 102 U/L (ref 38–126)
Anion gap: 15 (ref 5–15)
BUN: 15 mg/dL (ref 6–20)
CO2: 20 mmol/L — ABNORMAL LOW (ref 22–32)
Calcium: 9.9 mg/dL (ref 8.9–10.3)
Chloride: 107 mmol/L (ref 98–111)
Creatinine, Ser: 0.94 mg/dL (ref 0.44–1.00)
GFR calc Af Amer: >60 mL/min (ref >60)
GFR calc non Af Amer: >60 mL/min (ref >60)
Glucose, Bld: 160 mg/dL — ABNORMAL HIGH (ref 70–99)
Potassium: 3.8 mmol/L (ref 3.5–5.1)
Sodium: 142 mmol/L (ref 135–145)
Total Bilirubin: 0.7 mg/dL (ref 0.3–1.2)
Total Protein: 7.6 g/dL (ref 6.5–8.1)

## 2019-09-03 LAB — TYPE AND SCREEN
ABO/RH(D): A POS
Antibody Screen: NEGATIVE

## 2019-09-03 LAB — I-STAT BETA HCG BLOOD, ED (MC, WL, AP ONLY): I-stat hCG, quantitative: <5 m[IU]/mL (ref <5)

## 2019-09-03 LAB — LIPASE, BLOOD: Lipase: 27 U/L (ref 11–51)

## 2019-09-03 MED ORDER — ONDANSETRON 4 MG PO TBDP
4.0000 mg | ORAL_TABLET | Freq: Once | ORAL | Status: AC | PRN
  Administered 2019-09-03: 4 mg via ORAL
  Filled 2019-09-03: qty 1

## 2019-09-03 NOTE — ED Triage Notes (Signed)
Per [facility] pt having emesis onset of yesterday and diarrhea 2 days ago. Having lower abdominal pain all across. Reports stomach issues last month and feels like she's had intermittent diarrhea since. Feels extremely dehydrated.

## 2019-09-03 NOTE — ED Notes (Signed)
Patients family member came to RN stating she was taking the patient to another facility due to long wait time.

## 2019-09-03 NOTE — ED Triage Notes (Signed)
Patient vomiting in triage, with blood tinged.

## 2019-09-07 ENCOUNTER — Encounter: Payer: Medicaid Other | Admitting: Physician Assistant

## 2019-09-14 ENCOUNTER — Ambulatory Visit (INDEPENDENT_AMBULATORY_CARE_PROVIDER_SITE_OTHER): Payer: Medicaid Other | Admitting: *Deleted

## 2019-09-14 DIAGNOSIS — I428 Other cardiomyopathies: Secondary | ICD-10-CM

## 2019-09-14 LAB — CUP PACEART REMOTE DEVICE CHECK
Battery Remaining Longevity: 72 mo
Battery Remaining Percentage: 76 %
Brady Statistic RV Percent Paced: 0 %
Date Time Interrogation Session: 20210427042200
HighPow Impedance: 68 Ohm
Implantable Lead Implant Date: 20150514
Implantable Lead Location: 753860
Implantable Lead Model: 292
Implantable Lead Serial Number: 341088
Implantable Pulse Generator Implant Date: 20150514
Lead Channel Impedance Value: 1601 Ohm
Lead Channel Pacing Threshold Amplitude: 5.5 V
Lead Channel Pacing Threshold Pulse Width: 1 ms
Lead Channel Setting Pacing Amplitude: 5.5 V
Lead Channel Setting Pacing Pulse Width: 0.8 ms
Lead Channel Setting Sensing Sensitivity: 0.6 mV
Pulse Gen Serial Number: 115386

## 2019-09-15 NOTE — Progress Notes (Signed)
ICD Remote  

## 2019-10-09 ENCOUNTER — Other Ambulatory Visit: Payer: Self-pay | Admitting: Family Medicine

## 2019-10-11 ENCOUNTER — Other Ambulatory Visit: Payer: Self-pay

## 2019-10-11 MED ORDER — LISINOPRIL 2.5 MG PO TABS: 2.5000 mg | ORAL_TABLET | Freq: Every day | ORAL | 3 refills | Status: DC

## 2019-12-14 ENCOUNTER — Ambulatory Visit (INDEPENDENT_AMBULATORY_CARE_PROVIDER_SITE_OTHER): Payer: Medicaid Other | Admitting: *Deleted

## 2019-12-14 DIAGNOSIS — I428 Other cardiomyopathies: Secondary | ICD-10-CM | POA: Diagnosis not present

## 2019-12-15 LAB — CUP PACEART REMOTE DEVICE CHECK
Battery Remaining Longevity: 72 mo
Battery Remaining Percentage: 77 %
Brady Statistic RV Percent Paced: 0 %
Date Time Interrogation Session: 20210727042200
HighPow Impedance: 88 Ohm
Implantable Lead Implant Date: 20150514
Implantable Lead Location: 753860
Implantable Lead Model: 292
Implantable Lead Serial Number: 341088
Implantable Pulse Generator Implant Date: 20150514
Lead Channel Impedance Value: 1641 Ohm
Lead Channel Pacing Threshold Amplitude: 5.5 V
Lead Channel Pacing Threshold Pulse Width: 1 ms
Lead Channel Setting Pacing Amplitude: 5.5 V
Lead Channel Setting Pacing Pulse Width: 0.8 ms
Lead Channel Setting Sensing Sensitivity: 0.6 mV
Pulse Gen Serial Number: 115386

## 2019-12-16 DIAGNOSIS — I493 Ventricular premature depolarization: Secondary | ICD-10-CM | POA: Diagnosis not present

## 2019-12-16 DIAGNOSIS — K449 Diaphragmatic hernia without obstruction or gangrene: Secondary | ICD-10-CM | POA: Diagnosis not present

## 2019-12-16 DIAGNOSIS — N838 Other noninflammatory disorders of ovary, fallopian tube and broad ligament: Secondary | ICD-10-CM | POA: Diagnosis not present

## 2019-12-16 DIAGNOSIS — Z20822 Contact with and (suspected) exposure to covid-19: Secondary | ICD-10-CM | POA: Diagnosis not present

## 2019-12-16 DIAGNOSIS — N390 Urinary tract infection, site not specified: Secondary | ICD-10-CM | POA: Diagnosis not present

## 2019-12-16 DIAGNOSIS — K439 Ventral hernia without obstruction or gangrene: Secondary | ICD-10-CM | POA: Diagnosis not present

## 2019-12-16 DIAGNOSIS — R112 Nausea with vomiting, unspecified: Secondary | ICD-10-CM | POA: Diagnosis not present

## 2019-12-16 DIAGNOSIS — E278 Other specified disorders of adrenal gland: Secondary | ICD-10-CM | POA: Diagnosis not present

## 2019-12-16 DIAGNOSIS — E860 Dehydration: Secondary | ICD-10-CM | POA: Diagnosis not present

## 2019-12-16 DIAGNOSIS — R0602 Shortness of breath: Secondary | ICD-10-CM | POA: Diagnosis not present

## 2019-12-20 NOTE — Progress Notes (Signed)
Remote ICD transmission.   

## 2020-01-04 ENCOUNTER — Other Ambulatory Visit: Payer: Self-pay | Admitting: Family Medicine

## 2020-01-04 ENCOUNTER — Other Ambulatory Visit: Payer: Self-pay | Admitting: Internal Medicine

## 2020-01-04 NOTE — Telephone Encounter (Signed)
Pt last saw Alejandro Mulling, [place] on 08/31/19, last labs Creat 0.94, age 44, weight 77.6kg, CrCl 94.53, based on CrCl pt is on appropriate dosage of Xarelto 20mg  QD.  Will refill rx.

## 2020-01-06 ENCOUNTER — Other Ambulatory Visit: Payer: Self-pay | Admitting: *Deleted

## 2020-01-06 MED ORDER — DULOXETINE HCL 20 MG PO CPEP: 20.0000 mg | ORAL_CAPSULE | Freq: Two times a day (BID) | ORAL | 2 refills | Status: DC

## 2020-01-06 NOTE — Telephone Encounter (Signed)
Note from pharmacy says Provider not set up with medicaid [employer] please call [phone]. Routing to Dr. Manson Passey to send this in if needed.  Jezebelle Ledwell Zimmerman Rumple, CMA

## 2020-01-16 ENCOUNTER — Other Ambulatory Visit: Payer: Self-pay | Admitting: Internal Medicine

## 2020-02-14 ENCOUNTER — Other Ambulatory Visit: Payer: Self-pay | Admitting: Internal Medicine

## 2020-02-22 ENCOUNTER — Telehealth: Payer: Self-pay | Admitting: Internal Medicine

## 2020-02-22 NOTE — Telephone Encounter (Signed)
    Pt said she will send paperwork for Dr. Graciela Husbands to sign she said its for her housing. She will fax the paperwork today to [phone]

## 2020-02-24 DIAGNOSIS — N10 Acute tubulo-interstitial nephritis: Secondary | ICD-10-CM | POA: Diagnosis not present

## 2020-02-24 DIAGNOSIS — R300 Dysuria: Secondary | ICD-10-CM | POA: Diagnosis not present

## 2020-02-29 NOTE — Telephone Encounter (Signed)
Follow up:     Patient calling to get paper work that she dropped off last week. Patient need the paper for housing, before she become homeless. Please advised ASAP.

## 2020-03-03 NOTE — Telephone Encounter (Signed)
Spoke with pt and advised per Dr Graciela Husbands he is unable to sign off on HUD 202 Disability papers.  He did note this on her form.  Will place form at front for pt to pick up.  Pt verbalizes understanding and agrees with current plan.

## 2020-03-14 ENCOUNTER — Ambulatory Visit (INDEPENDENT_AMBULATORY_CARE_PROVIDER_SITE_OTHER): Payer: Medicaid Other

## 2020-03-14 DIAGNOSIS — I428 Other cardiomyopathies: Secondary | ICD-10-CM | POA: Diagnosis not present

## 2020-03-18 LAB — CUP PACEART REMOTE DEVICE CHECK
Battery Remaining Longevity: 72 mo
Battery Remaining Percentage: 74 %
Brady Statistic RV Percent Paced: 0 %
Date Time Interrogation Session: 20211029163300
HighPow Impedance: 71 Ohm
Implantable Lead Implant Date: 20150514
Implantable Lead Location: 753860
Implantable Lead Model: 292
Implantable Lead Serial Number: 341088
Implantable Pulse Generator Implant Date: 20150514
Lead Channel Impedance Value: 1527 Ohm
Lead Channel Pacing Threshold Amplitude: 5.5 V
Lead Channel Pacing Threshold Pulse Width: 1 ms
Lead Channel Setting Pacing Amplitude: 5.5 V
Lead Channel Setting Pacing Pulse Width: 0.8 ms
Lead Channel Setting Sensing Sensitivity: 0.6 mV
Pulse Gen Serial Number: 115386

## 2020-03-20 ENCOUNTER — Telehealth: Payer: Self-pay

## 2020-03-20 NOTE — Telephone Encounter (Signed)
Patient calls nurse line about housing forms. Patient has an apt with PCP on 11/4, however she needs this paper filled out by the end of the day today. Please call her to discuss.

## 2020-03-21 NOTE — Telephone Encounter (Signed)
Attempted to call pt.  No answer.  Left voicemail.  Will try again in the afternoon.

## 2020-03-21 NOTE — Telephone Encounter (Signed)
Attempted again to reach pt. Unsuccessfully.

## 2020-03-21 NOTE — Progress Notes (Signed)
Remote ICD transmission.   

## 2020-03-23 ENCOUNTER — Other Ambulatory Visit (HOSPITAL_COMMUNITY)
Admission: RE | Admit: 2020-03-23 | Discharge: 2020-03-23 | Disposition: A | Discharge status: Home / Self Care | Payer: Medicaid Other | Source: Ambulatory Visit | Attending: Family Medicine | Admitting: Family Medicine

## 2020-03-23 ENCOUNTER — Encounter: Payer: Self-pay | Admitting: Family Medicine

## 2020-03-23 ENCOUNTER — Other Ambulatory Visit: Payer: Self-pay

## 2020-03-23 ENCOUNTER — Ambulatory Visit: Payer: Medicaid Other | Admitting: Family Medicine

## 2020-03-23 ENCOUNTER — Other Ambulatory Visit: Payer: Self-pay | Admitting: Internal Medicine

## 2020-03-23 VITALS — BP 110/64 | HR 85 | Ht 64.0 in | Wt 167.4 lb

## 2020-03-23 DIAGNOSIS — N898 Other specified noninflammatory disorders of vagina: Secondary | ICD-10-CM | POA: Diagnosis not present

## 2020-03-23 DIAGNOSIS — Z5900 Homelessness unspecified: Secondary | ICD-10-CM

## 2020-03-23 DIAGNOSIS — A599 Trichomoniasis, unspecified: Secondary | ICD-10-CM

## 2020-03-23 LAB — POCT URINALYSIS DIP (MANUAL ENTRY)
Bilirubin, UA: NEGATIVE
Blood, UA: NEGATIVE
Glucose, UA: NEGATIVE mg/dL
Ketones, POC UA: NEGATIVE mg/dL
Nitrite, UA: NEGATIVE
Protein Ur, POC: NEGATIVE mg/dL
Spec Grav, UA: 1.025 (ref 1.010–1.025)
Urobilinogen, UA: 0.2 E.U./dL
pH, UA: 5 (ref 5.0–8.0)

## 2020-03-23 LAB — POCT WET PREP (WET MOUNT)
Clue Cells Wet Prep Whiff POC: POSITIVE
WBC, Wet Prep HPF POC: >20

## 2020-03-23 LAB — POCT UA - MICROSCOPIC ONLY
Epithelial cells, urine per micros: >20
Trichomonas, UA: POSITIVE

## 2020-03-23 MED ORDER — METRONIDAZOLE 500 MG PO TABS: 500.0000 mg | ORAL_TABLET | Freq: Two times a day (BID) | ORAL | 0 refills | Status: DC

## 2020-03-23 NOTE — Patient Instructions (Addendum)
It was nice to see you again today,  I have prescribed you medication called Flagyl for your trichomonas infection.  Take this medicine twice a day for 7 days.  I would like to see you back again in about 2 to 3 weeks to see if your symptoms have resolved.  I will fill out the paperwork you requested and email it to the person you requested.  Have a great day,  Frederic Jericho, MD

## 2020-03-23 NOTE — Progress Notes (Signed)
    SUBJECTIVE:   CHIEF COMPLAINT / HPI:   Dysuria/itching: patient received Abx treatment pyelo/uti at [facility]. She states she took the whole course of medications but still has some symptoms.  These include 'cramping' in her kidneys and vulvovaginal itching, and dysuria.  She has some vaginal 'wetness' that she describes like bladder leakage.  She has the urger to urinate frequently.      Housing: Landlord evicted her bc she put her name on sliding scale housing.  Now pt is looking for new housing.  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 She states the property mgmt company requires the paperwork to prove it is for a physical and not a mental disabilty.  Pt would like me to email the paperwork to: [phone] [address] [facility] [employer].  [email or web address].    PERTINENT  PMH / PSH: cardiomyopathy,   OBJECTIVE:   BP 110/64   Pulse 85   Ht 5\' 4"  (1.626 m)   Wt 167 lb 6.4 oz (75.9 kg)   LMP 03/12/2020 (Exact Date)   SpO2 99%   BMI 28.73 kg/m   CV: RRR. No murmurs.  Pulm: LCTAB GU: copious yellow discharge.  Otherwise normal exam, per Dr. 03/14/2020.    ASSESSMENT/PLAN:   Trichomoniasis Seen on UA and again on wet prep.  Pt denies sexual intercourse for at least 2 years.  Treated with metronidazole x 7d.  Getting testing for Gonorrhea, chlamydia as well.  F/u w/ pt in two weeks.    Homelessness Pt states she was evicted from previous housing and is liiving with friends temporarily.  She is trying to get 'sliding scale' housing and needs some paperwork filled out.  Will fill out paperwork and email it to the appropriate party.      Annia Friendly, MD [facility]

## 2020-03-23 NOTE — Assessment & Plan Note (Signed)
Seen on UA and again on wet prep.  Pt denies sexual intercourse for at least 2 years.  Treated with metronidazole x 7d.  Getting testing for Gonorrhea, chlamydia as well.  F/u w/ pt in two weeks.

## 2020-03-23 NOTE — Assessment & Plan Note (Addendum)
Pt states she was evicted from previous housing and is liiving with friends temporarily.  She is trying to get 'sliding scale' housing and needs some paperwork filled out.  Will fill out paperwork and email it to the appropriate party.

## 2020-03-24 LAB — CERVICOVAGINAL ANCILLARY ONLY
Chlamydia: NEGATIVE
Comment: NEGATIVE
Comment: NEGATIVE
Comment: NORMAL
Neisseria Gonorrhea: NEGATIVE
Trichomonas: POSITIVE — AB

## 2020-03-27 LAB — CYTOLOGY - PAP
Comment: NEGATIVE
Diagnosis: HIGH — AB
High risk HPV: POSITIVE — AB

## 2020-04-02 ENCOUNTER — Other Ambulatory Visit: Payer: Self-pay | Admitting: Family Medicine

## 2020-04-02 DIAGNOSIS — R87613 High grade squamous intraepithelial lesion on cytologic smear of cervix (HGSIL): Secondary | ICD-10-CM

## 2020-04-07 DIAGNOSIS — R1032 Left lower quadrant pain: Secondary | ICD-10-CM | POA: Diagnosis not present

## 2020-04-07 DIAGNOSIS — R112 Nausea with vomiting, unspecified: Secondary | ICD-10-CM | POA: Diagnosis not present

## 2020-04-07 DIAGNOSIS — D259 Leiomyoma of uterus, unspecified: Secondary | ICD-10-CM | POA: Diagnosis not present

## 2020-04-07 DIAGNOSIS — R5383 Other fatigue: Secondary | ICD-10-CM | POA: Diagnosis not present

## 2020-04-07 DIAGNOSIS — E278 Other specified disorders of adrenal gland: Secondary | ICD-10-CM | POA: Diagnosis not present

## 2020-04-07 DIAGNOSIS — K429 Umbilical hernia without obstruction or gangrene: Secondary | ICD-10-CM | POA: Diagnosis not present

## 2020-04-10 ENCOUNTER — Telehealth: Payer: Self-pay

## 2020-04-10 NOTE — Telephone Encounter (Signed)
Transition Care Management Unsuccessful Follow-up Telephone Call  Date of discharge and from where:  04/07/2020 [facility]  Attempts:  1st Attempt  Reason for unsuccessful TCM follow-up call:  Left voice message

## 2020-04-11 NOTE — Telephone Encounter (Signed)
Transition Care Management Unsuccessful Follow-up Telephone Call  Date of discharge and from where:  04/07/2020 [facility]   Attempts:  2nd Attempt  Reason for unsuccessful TCM follow-up call:  Left voice message

## 2020-04-14 NOTE — Telephone Encounter (Signed)
Transition Care Management Unsuccessful Follow-up Telephone Call  Date of discharge and from where:  04/07/2020 [facility]  Attempts:  3rd Attempt  Reason for unsuccessful TCM follow-up call:  Left voice message

## 2020-05-04 ENCOUNTER — Other Ambulatory Visit: Payer: Self-pay | Admitting: *Deleted

## 2020-05-04 MED ORDER — DULOXETINE HCL 20 MG PO CPEP
20.0000 mg | ORAL_CAPSULE | Freq: Two times a day (BID) | ORAL | 2 refills | Status: DC
Start: 2020-05-04 — End: 2020-09-05

## 2020-05-08 ENCOUNTER — Telehealth: Payer: Self-pay

## 2020-05-08 ENCOUNTER — Other Ambulatory Visit: Payer: Self-pay | Admitting: Obstetrics & Gynecology

## 2020-05-08 ENCOUNTER — Encounter: Payer: Self-pay | Admitting: Obstetrics & Gynecology

## 2020-05-08 ENCOUNTER — Other Ambulatory Visit: Payer: Self-pay

## 2020-05-08 ENCOUNTER — Ambulatory Visit: Payer: Medicaid Other | Admitting: Obstetrics & Gynecology

## 2020-05-08 ENCOUNTER — Other Ambulatory Visit (HOSPITAL_COMMUNITY)
Admission: RE | Admit: 2020-05-08 | Discharge: 2020-05-08 | Disposition: A | Discharge status: Home / Self Care | Payer: Medicaid Other | Source: Ambulatory Visit | Attending: Obstetrics & Gynecology | Admitting: Obstetrics & Gynecology

## 2020-05-08 VITALS — BP 125/71 | HR 94 | Ht 63.0 in | Wt 169.0 lb

## 2020-05-08 DIAGNOSIS — Z8619 Personal history of other infectious and parasitic diseases: Secondary | ICD-10-CM | POA: Insufficient documentation

## 2020-05-08 DIAGNOSIS — Z01812 Encounter for preprocedural laboratory examination: Secondary | ICD-10-CM | POA: Diagnosis not present

## 2020-05-08 DIAGNOSIS — R87613 High grade squamous intraepithelial lesion on cytologic smear of cervix (HGSIL): Secondary | ICD-10-CM | POA: Diagnosis not present

## 2020-05-08 LAB — POCT URINE PREGNANCY: Preg Test, Ur: NEGATIVE

## 2020-05-08 NOTE — Telephone Encounter (Signed)
Latitude alert received for VT-1 event (28 seconds in duration) w/ ATP X1 (successful). Occurred 04/12/20 @ 1851.   EMR: Amiodarone 200 mg daily, Toprol- XL 25 mg BID, xarelto 1 mg daily.   Called patient to assess/medication compliance. No answer, LMOVM.

## 2020-05-08 NOTE — Progress Notes (Signed)
   Subjective:    Patient ID: Diana Moreno, female    DOB: 10-May-1976, 44 y.o.   MRN: [id number]  HPI  Pt here for colposcopy.  She is new to our practice.  Pt has internal deb and on xeralto.  She took medication last night.  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  It doesn't look like she has had other follow up.  She was HSIL, +HPV and +trich at pap November 2021.  Pt is not sexually active currently.  Pt has had long stading genital warts and not treated by MD in [place].    Review of Systems  Constitutional: Negative.   Respiratory: Negative.   Cardiovascular: Negative.   Gastrointestinal: Negative.   Genitourinary: Negative.        Objective:   Physical Exam Vitals reviewed.  Constitutional:      General: She is not in acute distress.    Appearance: She is well-developed and well-nourished.  HENT:     Head: Normocephalic and atraumatic.  Eyes:     Conjunctiva/sclera: Conjunctivae normal.  Cardiovascular:     Rate and Rhythm: Normal rate.  Pulmonary:     Effort: Pulmonary effort is normal.  Genitourinary:    Comments: Tanner V Vulva:  Condyloma Vagina:  Pink, no lesions, no discharge, no blood Cervix:  No CMT Uterus:  Non tender, mobile Right adnexa--non tender, no mass Left adnexa--non tender, no mass  Musculoskeletal:        General: No edema.  Skin:    General: Skin is warm and dry.  Neurological:     Mental Status: She is alert and oriented to person, place, and time.  Psychiatric:        Mood and Affect: Mood and affect normal.    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  Aldara for vulvar warts 2.  Colposcopy today.  Colposcopy Procedure Note  Indications: Pap smear 1 months ago showed: high-grade squamous intraepithelial neoplasia  (HGSIL-encompassing moderate and severe dysplasia). The prior pap showed  normal cytology and HPV +.  Prior cervical/vaginal disease: none per patient. Prior cervical treatment: no treatment.  Procedure Details  The risks and benefits of the procedure and Written informed consent obtained.  Speculum placed in vagina and excellent visualization of cervix achieved, cervix swabbed x 3 with acetic acid solution.  Findings: Cervix: acetowhite lesion(s) noted at 4-8 o'clock; cervix swabbed with Lugol's solution and SCJ visualized - lesion at 4-8 o'clock. Vaginal inspection: vaginal colposcopy not performed. Vulvar colposcopy: vulvar colposcopy not performed.  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  Plan: Specimens labelled and sent to Pathology. Will base further treatment on Pathology findings. If patient needs LEEP, suggest coming off xleralto

## 2020-05-09 LAB — CERVICOVAGINAL ANCILLARY ONLY
Comment: NEGATIVE
Trichomonas: NEGATIVE

## 2020-05-10 NOTE — Telephone Encounter (Signed)
Attempted to contact patient by phone in reference to one episode of VT/high heart rate with ATP therapy received on Nov. 24 th 2021. No answer, voicemail left with instructions to contact the device clinic at [phone].

## 2020-05-11 ENCOUNTER — Encounter: Payer: Self-pay | Admitting: Emergency Medicine

## 2020-05-11 ENCOUNTER — Telehealth: Payer: Self-pay

## 2020-05-11 DIAGNOSIS — A630 Anogenital (venereal) warts: Secondary | ICD-10-CM

## 2020-05-11 MED ORDER — IMIQUIMOD 5 % EX CREA: TOPICAL_CREAM | CUTANEOUS | 0 refills | Status: DC

## 2020-05-11 NOTE — Telephone Encounter (Signed)
Pt called requesting Rx for Aldara sent to pharmacy. Per Dr.Leggett's note pt can have Rx. Instructions reviewed with Dr.Davis in office and Rx sent. Spoke with pt and she is aware to use a thin layer of cream 3 times a week (alternating days) at bedtime and to leave it on over night and wash it off in the morning with soap and water. Pt expressed understanding.

## 2020-05-11 NOTE — Telephone Encounter (Signed)
Attempted to call patient in reference to ATP therapy that occurred on Nov 24 th 2021. Third attempt to contact patient by phone, no answer. Message left on patient's voicemail to contact the device clinic at [phone].   Will send certified letter.

## 2020-05-16 ENCOUNTER — Telehealth: Payer: Self-pay | Admitting: Emergency Medicine

## 2020-05-16 ENCOUNTER — Other Ambulatory Visit: Payer: Self-pay | Admitting: Emergency Medicine

## 2020-05-16 NOTE — Progress Notes (Signed)
Patient returned call with directions and telephone advice given. See other phone note.

## 2020-05-16 NOTE — Telephone Encounter (Addendum)
Patient returned call about message that was received in regards to receiving ATP x 1 therapy on Nov. 24 th 2021. Certified letter was also sent to patient's on file address to contact the device clinic in regards to same event. Patient states that she was moving furniture on  Nov. 24 th 2021 and did not feel any different. Patient denied any signs or symptoms. Patient states that she has felt fine with no signs, symptoms, or any other episodes since Nov. 24 th 2021. Patient states compliance with current medications. Patient's upcoming appointment with Dr. Graciela Husbands on Feb.4 th 2022 at 3 p.m. confirmed. Patient also had questions regarding her monitor in reference to if it was set up for 5g? [employer] number given [phone]. [place] DMV driving restrictions reviewed. Advised if any problems, questions, or concerns to contact the Device Clinic at [phone].

## 2020-05-16 NOTE — Progress Notes (Addendum)
See phone notes.

## 2020-05-18 ENCOUNTER — Encounter: Payer: Self-pay | Admitting: Obstetrics & Gynecology

## 2020-05-18 DIAGNOSIS — N879 Dysplasia of cervix uteri, unspecified: Secondary | ICD-10-CM | POA: Insufficient documentation

## 2020-06-13 ENCOUNTER — Ambulatory Visit (INDEPENDENT_AMBULATORY_CARE_PROVIDER_SITE_OTHER): Payer: Medicaid Other

## 2020-06-13 DIAGNOSIS — I472 Ventricular tachycardia, unspecified: Secondary | ICD-10-CM

## 2020-06-13 DIAGNOSIS — I428 Other cardiomyopathies: Secondary | ICD-10-CM

## 2020-06-14 LAB — CUP PACEART REMOTE DEVICE CHECK
Battery Remaining Longevity: 66 mo
Battery Remaining Percentage: 71 %
Brady Statistic RV Percent Paced: 0 %
Date Time Interrogation Session: 20220125182500
HighPow Impedance: 72 Ohm
Implantable Lead Implant Date: 20150514
Implantable Lead Location: 753860
Implantable Lead Model: 292
Implantable Lead Serial Number: 341088
Implantable Pulse Generator Implant Date: 20150514
Lead Channel Impedance Value: 1611 Ohm
Lead Channel Pacing Threshold Amplitude: 5.5 V
Lead Channel Pacing Threshold Pulse Width: 1 ms
Lead Channel Setting Pacing Amplitude: 5.5 V
Lead Channel Setting Pacing Pulse Width: 0.8 ms
Lead Channel Setting Sensing Sensitivity: 0.6 mV
Pulse Gen Serial Number: 115386

## 2020-06-22 DIAGNOSIS — I472 Ventricular tachycardia, unspecified: Secondary | ICD-10-CM | POA: Insufficient documentation

## 2020-06-23 ENCOUNTER — Encounter: Payer: Medicaid Other | Admitting: Internal Medicine

## 2020-06-23 DIAGNOSIS — I480 Paroxysmal atrial fibrillation: Secondary | ICD-10-CM

## 2020-06-23 DIAGNOSIS — Z95810 Presence of automatic (implantable) cardiac defibrillator: Secondary | ICD-10-CM

## 2020-06-23 DIAGNOSIS — I472 Ventricular tachycardia: Secondary | ICD-10-CM

## 2020-06-24 NOTE — Progress Notes (Signed)
Remote ICD transmission.   

## 2020-07-05 ENCOUNTER — Telehealth: Payer: Self-pay | Admitting: *Deleted

## 2020-07-05 NOTE — Telephone Encounter (Signed)
Left patient a message to call and schedule 3 month follow up appointment with Dr. Penne Lash.

## 2020-07-21 ENCOUNTER — Other Ambulatory Visit: Payer: Self-pay

## 2020-07-21 ENCOUNTER — Ambulatory Visit (INDEPENDENT_AMBULATORY_CARE_PROVIDER_SITE_OTHER): Payer: Medicaid Other | Admitting: Internal Medicine

## 2020-07-21 ENCOUNTER — Encounter: Payer: Self-pay | Admitting: Internal Medicine

## 2020-07-21 VITALS — BP 124/68 | HR 71 | Ht 64.0 in | Wt 172.8 lb

## 2020-07-21 DIAGNOSIS — Z95810 Presence of automatic (implantable) cardiac defibrillator: Secondary | ICD-10-CM

## 2020-07-21 DIAGNOSIS — I428 Other cardiomyopathies: Secondary | ICD-10-CM | POA: Diagnosis not present

## 2020-07-21 DIAGNOSIS — I5022 Chronic systolic (congestive) heart failure: Secondary | ICD-10-CM | POA: Diagnosis not present

## 2020-07-21 DIAGNOSIS — I480 Paroxysmal atrial fibrillation: Secondary | ICD-10-CM | POA: Diagnosis not present

## 2020-07-21 DIAGNOSIS — I472 Ventricular tachycardia, unspecified: Secondary | ICD-10-CM

## 2020-07-21 MED ORDER — ATORVASTATIN CALCIUM 20 MG PO TABS: 20.0000 mg | ORAL_TABLET | Freq: Every day | ORAL | 3 refills | Status: DC

## 2020-07-21 NOTE — Patient Instructions (Addendum)
Medication Instructions:  Stop you Atorvastatin for one month, then restart at 20 mg daily  Your physician recommends that you continue on your current medications as directed. Please refer to the Current Medication list given to you today.  Labwork: Cmet, TSH, CBC  Testing/Procedures: None ordered.  Follow-Up:   Dr. Tenny Craw 12/08/20 at 2:20 pm   Your physician wants you to follow-up in: one year with Dr. Graciela Husbands.    You will receive a reminder letter in the mail two months in advance. If you don't receive a letter, please call our office to schedule the follow-up appointment.  Remote monitoring is used to monitor your ICD from home. This monitoring reduces the number of office visits required to check your device to one time per year. It allows [place] to keep an eye on the functioning of your device to ensure it is working properly. You are scheduled for a device check from home on 09/12/20. You may send your transmission at any time that day. If you have a wireless device, the transmission will be sent automatically. After your physician reviews your transmission, you will receive a postcard with your next transmission date.  Any Other Special Instructions Will Be Listed Below (If Applicable).  If you need a refill on your cardiac medications before your next appointment, please call your pharmacy.
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
## 2020-07-22 LAB — COMPREHENSIVE METABOLIC PANEL
ALT: 13 IU/L (ref 0–32)
AST: 19 IU/L (ref 0–40)
Albumin/Globulin Ratio: 2 (ref 1.2–2.2)
Albumin: 4.3 g/dL (ref 3.8–4.8)
Alkaline Phosphatase: 114 IU/L (ref 44–121)
BUN/Creatinine Ratio: 13 (ref 9–23)
BUN: 10 mg/dL (ref 6–24)
Bilirubin Total: <0.2 mg/dL (ref 0.0–1.2)
CO2: 16 mmol/L — ABNORMAL LOW (ref 20–29)
Calcium: 9.4 mg/dL (ref 8.7–10.2)
Chloride: 108 mmol/L — ABNORMAL HIGH (ref 96–106)
Creatinine, Ser: 0.77 mg/dL (ref 0.57–1.00)
Globulin, Total: 2.1 g/dL (ref 1.5–4.5)
Glucose: 88 mg/dL (ref 65–99)
Potassium: 4.5 mmol/L (ref 3.5–5.2)
Sodium: 141 mmol/L (ref 134–144)
Total Protein: 6.4 g/dL (ref 6.0–8.5)
eGFR: 97 mL/min/{1.73_m2} (ref >59)

## 2020-07-22 LAB — CBC
Hematocrit: 28.1 % — ABNORMAL LOW (ref 34.0–46.6)
Hemoglobin: 8.5 g/dL — ABNORMAL LOW (ref 11.1–15.9)
MCH: 21.3 pg — ABNORMAL LOW (ref 26.6–33.0)
MCHC: 30.2 g/dL — ABNORMAL LOW (ref 31.5–35.7)
MCV: 70 fL — ABNORMAL LOW (ref 79–97)
Platelets: 326 10*3/uL (ref 150–450)
RBC: 4 x10E6/uL (ref 3.77–5.28)
RDW: 17 % — ABNORMAL HIGH (ref 11.7–15.4)
WBC: 14.6 10*3/uL — ABNORMAL HIGH (ref 3.4–10.8)

## 2020-07-22 LAB — TSH: TSH: 0.558 u[IU]/mL (ref 0.450–4.500)

## 2020-07-31 ENCOUNTER — Other Ambulatory Visit: Payer: Self-pay

## 2020-07-31 ENCOUNTER — Encounter: Payer: Self-pay | Admitting: Obstetrics & Gynecology

## 2020-07-31 ENCOUNTER — Ambulatory Visit: Payer: Medicaid Other | Admitting: Obstetrics & Gynecology

## 2020-07-31 VITALS — BP 119/73 | HR 83 | Ht 63.0 in | Wt 168.0 lb

## 2020-07-31 DIAGNOSIS — A630 Anogenital (venereal) warts: Secondary | ICD-10-CM | POA: Diagnosis not present

## 2020-07-31 DIAGNOSIS — R87613 High grade squamous intraepithelial lesion on cytologic smear of cervix (HGSIL): Secondary | ICD-10-CM | POA: Diagnosis not present

## 2020-07-31 MED ORDER — IMIQUIMOD 5 % EX CREA: TOPICAL_CREAM | CUTANEOUS | 2 refills | Status: DC

## 2020-07-31 NOTE — Progress Notes (Signed)
Pt needs refill of Aldara- sees improvement Pt is on her period and does not want an exam

## 2020-08-03 NOTE — Progress Notes (Signed)
   Subjective:    Patient ID: Diana Moreno, female    DOB: Oct 10, 1975, 45 y.o.   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  She is seeing improvement and wants to continue.  Pt currently on menstrual cycle which is heavy from anticoagulation.    Review of Systems  Constitutional: Negative.   Respiratory: Negative.   Cardiovascular: Negative.   Gastrointestinal: Negative.   Genitourinary: Negative.        Objective:   Physical Exam Vitals reviewed.  Constitutional:      General: She is not in acute distress.    Appearance: She is well-developed.  HENT:     Head: Normocephalic and atraumatic.  Eyes:     Conjunctiva/sclera: Conjunctivae normal.  Cardiovascular:     Rate and Rhythm: Normal rate.  Pulmonary:     Effort: Pulmonary effort is normal.  Genitourinary:    Comments: Vulvar warts still present, slightly smaller.   Skin:    General: Skin is warm and dry.  Neurological:     Mental Status: She is alert and oriented to person, place, and time.       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  Continue Aldara 3x a week 2.  RTC in 12 weeks for evaluation; can consider TCA or cyro if needed.   3.  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

## 2020-09-04 ENCOUNTER — Other Ambulatory Visit: Payer: Self-pay | Admitting: Family Medicine

## 2020-09-12 ENCOUNTER — Ambulatory Visit (INDEPENDENT_AMBULATORY_CARE_PROVIDER_SITE_OTHER): Payer: Medicaid Other

## 2020-09-12 DIAGNOSIS — I428 Other cardiomyopathies: Secondary | ICD-10-CM | POA: Diagnosis not present

## 2020-09-12 LAB — CUP PACEART REMOTE DEVICE CHECK
Battery Remaining Longevity: 66 mo
Battery Remaining Percentage: 67 %
Brady Statistic RV Percent Paced: 0 %
Date Time Interrogation Session: 20220426052500
HighPow Impedance: 89 Ohm
Implantable Lead Implant Date: 20150514
Implantable Lead Location: 753860
Implantable Lead Model: 292
Implantable Lead Serial Number: 341088
Implantable Pulse Generator Implant Date: 20150514
Lead Channel Impedance Value: 1673 Ohm
Lead Channel Pacing Threshold Amplitude: 5 V
Lead Channel Pacing Threshold Pulse Width: 1 ms
Lead Channel Setting Pacing Amplitude: 5.5 V
Lead Channel Setting Pacing Pulse Width: 0.8 ms
Lead Channel Setting Sensing Sensitivity: 0.6 mV
Pulse Gen Serial Number: 115386

## 2020-09-21 ENCOUNTER — Telehealth: Payer: Self-pay | Admitting: *Deleted

## 2020-09-21 NOTE — Telephone Encounter (Signed)
Left patient a message to call and schedule 12 week F/U appointment with Dr. Penne Lash around 10/23/2020.

## 2020-10-04 NOTE — Progress Notes (Signed)
Remote ICD transmission.   

## 2020-10-14 ENCOUNTER — Other Ambulatory Visit: Payer: Self-pay | Admitting: Internal Medicine

## 2020-10-17 NOTE — Telephone Encounter (Signed)
Pt's age 45, wt 76.2 kg, SCr 0.77, CrCl 112.16, last ov w/ SK 07/21/20.

## 2020-11-06 ENCOUNTER — Ambulatory Visit: Payer: Medicaid Other | Admitting: Obstetrics & Gynecology

## 2020-11-23 ENCOUNTER — Other Ambulatory Visit: Payer: Self-pay

## 2020-11-23 ENCOUNTER — Encounter: Payer: Self-pay | Admitting: Obstetrics & Gynecology

## 2020-11-23 ENCOUNTER — Ambulatory Visit: Payer: Medicaid Other | Admitting: Obstetrics & Gynecology

## 2020-11-23 VITALS — BP 124/84 | HR 64 | Ht 63.0 in | Wt 166.0 lb

## 2020-11-23 DIAGNOSIS — A630 Anogenital (venereal) warts: Secondary | ICD-10-CM | POA: Diagnosis not present

## 2020-11-23 NOTE — Progress Notes (Signed)
   Subjective:    Patient ID: Diana Moreno, female    DOB: September 10, 1975, 45 y.o.   MRN: [id number]  HPI  Diana Moreno is a 45 year old-year-old female who tried a second round of all Dara.  This time the area got very red and irritated and is now regressed.  To me the warts are the same size.  Patient is ready to try TCA  Review of Systems  Constitutional: Negative.   Respiratory: Negative.    Cardiovascular: Negative.   Gastrointestinal: Negative.   Genitourinary: Negative.       Objective:   Physical Exam Vitals reviewed.  Constitutional:      General: She is not in acute distress.    Appearance: She is well-developed.  HENT:     Head: Normocephalic and atraumatic.  Eyes:     Conjunctiva/sclera: Conjunctivae normal.  Cardiovascular:     Rate and Rhythm: Normal rate.  Pulmonary:     Effort: Pulmonary effort is normal.  Genitourinary:    Comments: Large warts at the introitus and left labia as well as 1 near the anus. Skin:    General: Skin is warm and dry.  Neurological:     Mental Status: She is alert and oriented to person, place, and time.  Psychiatric:        Mood and Affect: Mood normal.  Vitals:   11/23/20 1616  BP: 124/84  Pulse: 64  Weight: 166 lb (75.3 kg)  Height: 5\' 3"  (1.6 m)      Assessment & Plan:   TCA application today and again in 2 weeks Patient also advised to chart Tagamet.  It has T-lymphocyte activity which can reduce warts.

## 2020-11-23 NOTE — Progress Notes (Signed)
Pt hasn't used Aldara in about 3 weeks

## 2020-12-08 ENCOUNTER — Encounter: Payer: Self-pay | Admitting: Internal Medicine

## 2020-12-08 ENCOUNTER — Ambulatory Visit: Payer: Medicaid Other | Admitting: Internal Medicine

## 2020-12-08 ENCOUNTER — Other Ambulatory Visit: Payer: Self-pay

## 2020-12-08 VITALS — BP 110/70 | HR 70 | Ht 64.0 in | Wt 165.2 lb

## 2020-12-08 DIAGNOSIS — Z79899 Other long term (current) drug therapy: Secondary | ICD-10-CM | POA: Diagnosis not present

## 2020-12-08 DIAGNOSIS — I5022 Chronic systolic (congestive) heart failure: Secondary | ICD-10-CM | POA: Diagnosis not present

## 2020-12-08 NOTE — Patient Instructions (Addendum)
Medication Instructions:  No changes *If you need a refill on your cardiac medications before your next appointment, please call your pharmacy*   Lab Work: Today: cbc, cmet, tsh, lipomed panel, hgA1c If you have labs (blood work) drawn today and your tests are completely normal, you will receive your results only by: MyChart Message (if you have MyChart) OR A paper copy in the mail If you have any lab test that is abnormal or we need to change your treatment, we will call you to review the results.   Testing/Procedures: Your physician has requested that you have an echocardiogram. Echocardiography is a painless test that uses sound waves to create images of your heart. It provides your doctor with information about the size and shape of your heart and how well your heart's chambers and valves are working. This procedure takes approximately one hour. There are no restrictions for this procedure.   Follow-Up: Will arrange follow up based on results.   Other Instructions We will contact [facility] to ask them to contact you to reestablish care.   [facility] [address] [place],  [place]  [address]
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
## 2020-12-09 LAB — COMPREHENSIVE METABOLIC PANEL
ALT: 10 IU/L (ref 0–32)
AST: 14 IU/L (ref 0–40)
Albumin/Globulin Ratio: 2.2 (ref 1.2–2.2)
Albumin: 4.6 g/dL (ref 3.8–4.8)
Alkaline Phosphatase: 100 IU/L (ref 44–121)
BUN/Creatinine Ratio: 11 (ref 9–23)
BUN: 10 mg/dL (ref 6–24)
Bilirubin Total: <0.2 mg/dL (ref 0.0–1.2)
CO2: 21 mmol/L (ref 20–29)
Calcium: 8.9 mg/dL (ref 8.7–10.2)
Chloride: 103 mmol/L (ref 96–106)
Creatinine, Ser: 0.89 mg/dL (ref 0.57–1.00)
Globulin, Total: 2.1 g/dL (ref 1.5–4.5)
Glucose: 94 mg/dL (ref 65–99)
Potassium: 4.6 mmol/L (ref 3.5–5.2)
Sodium: 138 mmol/L (ref 134–144)
Total Protein: 6.7 g/dL (ref 6.0–8.5)
eGFR: 82 mL/min/{1.73_m2} (ref >59)

## 2020-12-09 LAB — NMR, LIPOPROFILE
Cholesterol, Total: 202 mg/dL — ABNORMAL HIGH (ref 100–199)
HDL Particle Number: 24.3 umol/L — ABNORMAL LOW (ref >=30.5)
HDL-C: 44 mg/dL (ref >39)
LDL Particle Number: 1625 nmol/L — ABNORMAL HIGH (ref <1000)
LDL Size: 21.3 nm (ref >20.5)
LDL-C (NIH Calc): 135 mg/dL — ABNORMAL HIGH (ref 0–99)
LP-IR Score: 29 (ref <=45)
Small LDL Particle Number: 597 nmol/L — ABNORMAL HIGH (ref <=527)
Triglycerides: 130 mg/dL (ref 0–149)

## 2020-12-09 LAB — HEMOGLOBIN A1C
Est. average glucose Bld gHb Est-mCnc: 105 mg/dL
Hgb A1c MFr Bld: 5.3 % (ref 4.8–5.6)

## 2020-12-09 LAB — CBC
Hematocrit: 34 % (ref 34.0–46.6)
Hemoglobin: 9.4 g/dL — ABNORMAL LOW (ref 11.1–15.9)
MCH: 19.3 pg — ABNORMAL LOW (ref 26.6–33.0)
MCHC: 27.6 g/dL — ABNORMAL LOW (ref 31.5–35.7)
MCV: 70 fL — ABNORMAL LOW (ref 79–97)
Platelets: 374 10*3/uL (ref 150–450)
RBC: 4.86 x10E6/uL (ref 3.77–5.28)
RDW: 20.6 % — ABNORMAL HIGH (ref 11.7–15.4)
WBC: 13.6 10*3/uL — ABNORMAL HIGH (ref 3.4–10.8)

## 2020-12-09 LAB — APOLIPOPROTEIN B: Apolipoprotein B: 110 mg/dL — ABNORMAL HIGH (ref <90)

## 2020-12-09 LAB — TSH: TSH: 0.855 u[IU]/mL (ref 0.450–4.500)

## 2020-12-09 LAB — LIPOPROTEIN A (LPA): Lipoprotein (a): 117.4 nmol/L — ABNORMAL HIGH (ref <75.0)

## 2020-12-12 ENCOUNTER — Ambulatory Visit (INDEPENDENT_AMBULATORY_CARE_PROVIDER_SITE_OTHER): Payer: Medicaid Other

## 2020-12-12 DIAGNOSIS — I428 Other cardiomyopathies: Secondary | ICD-10-CM | POA: Diagnosis not present

## 2020-12-12 LAB — CUP PACEART REMOTE DEVICE CHECK
Battery Remaining Longevity: 60 mo
Battery Remaining Percentage: 63 %
Brady Statistic RV Percent Paced: 0 %
Date Time Interrogation Session: 20220726044500
HighPow Impedance: 100 Ohm
Implantable Lead Implant Date: 20150514
Implantable Lead Location: 753860
Implantable Lead Model: 292
Implantable Lead Serial Number: 341088
Implantable Pulse Generator Implant Date: 20150514
Lead Channel Impedance Value: 1731 Ohm
Lead Channel Pacing Threshold Amplitude: 5 V
Lead Channel Pacing Threshold Pulse Width: 1 ms
Lead Channel Setting Pacing Amplitude: 5.5 V
Lead Channel Setting Pacing Pulse Width: 0.8 ms
Lead Channel Setting Sensing Sensitivity: 0.6 mV
Pulse Gen Serial Number: 115386

## 2020-12-18 ENCOUNTER — Other Ambulatory Visit: Payer: Self-pay

## 2020-12-18 ENCOUNTER — Encounter: Payer: Self-pay | Admitting: Obstetrics & Gynecology

## 2020-12-18 ENCOUNTER — Ambulatory Visit: Payer: Medicaid Other | Admitting: Obstetrics & Gynecology

## 2020-12-18 VITALS — BP 116/79 | HR 86 | Ht 63.0 in | Wt 166.0 lb

## 2020-12-18 DIAGNOSIS — A630 Anogenital (venereal) warts: Secondary | ICD-10-CM

## 2020-12-18 NOTE — Progress Notes (Signed)
   Subjective:    Patient ID: Diana Moreno, female    DOB: 1975/06/07, 45 y.o.   MRN: [id number]  HPI  Pt here for repeat TCA; pt also taking tagamet   Review of Systems  Constitutional: Negative.   Respiratory: Negative.    Cardiovascular: Negative.   Gastrointestinal: Negative.   Genitourinary: Negative.       Objective:   Physical Exam Vitals reviewed.  Constitutional:      General: She is not in acute distress.    Appearance: She is well-developed.  HENT:     Head: Normocephalic and atraumatic.  Eyes:     Conjunctiva/sclera: Conjunctivae normal.  Cardiovascular:     Rate and Rhythm: Normal rate.  Pulmonary:     Effort: Pulmonary effort is normal.  Genitourinary:    Comments: Warts decreased in size by 1/3 Skin:    General: Skin is warm and dry.  Neurological:     Mental Status: She is alert and oriented to person, place, and time.  Psychiatric:        Mood and Affect: Mood normal.  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

## 2020-12-22 ENCOUNTER — Other Ambulatory Visit: Payer: Self-pay

## 2020-12-22 ENCOUNTER — Ambulatory Visit (HOSPITAL_COMMUNITY): Payer: Medicaid Other | Attending: Cardiovascular Disease

## 2020-12-22 DIAGNOSIS — Z79899 Other long term (current) drug therapy: Secondary | ICD-10-CM | POA: Insufficient documentation

## 2020-12-22 DIAGNOSIS — I5022 Chronic systolic (congestive) heart failure: Secondary | ICD-10-CM | POA: Diagnosis not present

## 2020-12-22 LAB — ECHOCARDIOGRAM COMPLETE
Area-P 1/2: 2.6 cm2
S' Lateral: 3.7 cm

## 2020-12-22 MED ORDER — PERFLUTREN LIPID MICROSPHERE
1.0000 mL | INTRAVENOUS | Status: AC | PRN
  Administered 2020-12-22: 2 mL via INTRAVENOUS

## 2020-12-29 ENCOUNTER — Other Ambulatory Visit: Payer: Self-pay

## 2020-12-29 MED ORDER — LISINOPRIL 2.5 MG PO TABS: 2.5000 mg | ORAL_TABLET | Freq: Every day | ORAL | 3 refills | Status: DC

## 2021-01-03 ENCOUNTER — Encounter: Payer: Self-pay | Admitting: Obstetrics & Gynecology

## 2021-01-03 ENCOUNTER — Ambulatory Visit (INDEPENDENT_AMBULATORY_CARE_PROVIDER_SITE_OTHER): Payer: Medicaid Other | Admitting: Obstetrics & Gynecology

## 2021-01-03 ENCOUNTER — Other Ambulatory Visit: Payer: Self-pay

## 2021-01-03 DIAGNOSIS — F19939 Other psychoactive substance use, unspecified with withdrawal, unspecified: Secondary | ICD-10-CM

## 2021-01-03 DIAGNOSIS — A630 Anogenital (venereal) warts: Secondary | ICD-10-CM

## 2021-01-03 DIAGNOSIS — F32A Depression, unspecified: Secondary | ICD-10-CM

## 2021-01-03 MED ORDER — PROMETHAZINE HCL 25 MG PO TABS: 25.0000 mg | ORAL_TABLET | Freq: Four times a day (QID) | ORAL | 0 refills | Status: DC | PRN

## 2021-01-03 MED ORDER — DULOXETINE HCL 20 MG PO CPEP: 20.0000 mg | ORAL_CAPSULE | Freq: Two times a day (BID) | ORAL | 0 refills | Status: DC

## 2021-01-03 NOTE — Progress Notes (Signed)
   Subjective:    Patient ID: Diana Moreno, female    DOB: 10-16-1975, 45 y.o.   MRN: [id number]  HPI  Pt presents for TCA application.  She has also run out of her Cymbalta and she is unable to reach her physician.  Office not calling back.  Pt is crying and fatigued.  She is also having nausea and vomiting which she attributes to not having the medication.    Review of Systems  Constitutional:  Positive for fatigue.  HENT: Negative.    Respiratory: Negative.    Cardiovascular: Negative.   Gastrointestinal:  Positive for nausea and vomiting.  Genitourinary:  Positive for vaginal bleeding.  Musculoskeletal: Negative.   Neurological:  Positive for dizziness.  Psychiatric/Behavioral:  Positive for dysphoric mood and sleep disturbance. Negative for self-injury and suicidal ideas. The patient is nervous/anxious.       Objective:   Physical Exam Vitals reviewed.  Constitutional:      General: She is not in acute distress.    Appearance: She is well-developed.  HENT:     Head: Normocephalic and atraumatic.  Eyes:     Conjunctiva/sclera: Conjunctivae normal.  Cardiovascular:     Rate and Rhythm: Normal rate.  Pulmonary:     Effort: Pulmonary effort is normal.  Genitourinary:    Comments: TCA applied to genital warts at introitus and perianal Skin:    General: Skin is warm and dry.  Neurological:     Mental Status: She is alert and oriented to person, place, and time.  Psychiatric:        Mood and Affect: Mood normal.      Assessment & Plan:  45 yo female with genital warts and abrupt cessation of SNRI causing emotional distress.   Cymbalta refill given.  The dose prescribed by [facility] residents is bid which is unfamiliar to me.  I am reaching out to psych to confirm best dosing. Notes sent to [facility] to f/u with patient.  She has been unable to reach them. TCA applied to warts. Continue Tagamet  25 minutes spent with patient and coordination of care in  addition to time spent on TCA application.

## 2021-01-04 ENCOUNTER — Other Ambulatory Visit: Payer: Self-pay | Admitting: *Deleted

## 2021-01-04 ENCOUNTER — Telehealth: Payer: Self-pay | Admitting: *Deleted

## 2021-01-04 NOTE — Telephone Encounter (Signed)
-----   Message from Shelby Mattocks, DO sent at 01/04/2021  6:25 AM EDT ----- Could someone reach out to this patient and ask what concerns she needs addressed? Frederic Jericho is still listed as her PCP which should be my name instead so any concerns she has are sent to me. Thanks!

## 2021-01-05 NOTE — Progress Notes (Signed)
Remote ICD transmission.   

## 2021-01-05 NOTE — Telephone Encounter (Signed)
Received message from another provider in [facility] that patient was having issues getting refills from [place].  My suspicion is that the pharmacy was still attempting to send to Dr. Constance Goltz, who is no longer at this practice, as we have not seen any refill request.  I called patient to discuss issus and see if she needed anything else.  I had to LMOVM to callback.  Will await call. Jone Baseman, CMA

## 2021-01-09 NOTE — Telephone Encounter (Signed)
LMOVM of patient again . Diana Moreno, CMA

## 2021-01-11 NOTE — Telephone Encounter (Signed)
Medication refilled by Dr. Penne Lash on 01-03-21.  Taven Strite,CMA

## 2021-01-12 NOTE — Telephone Encounter (Signed)
LMOVM for callback.  At this point will wait for patient to respond.  Jone Baseman, CMA

## 2021-01-29 ENCOUNTER — Ambulatory Visit: Payer: Medicaid Other | Admitting: Obstetrics and Gynecology

## 2021-02-01 ENCOUNTER — Other Ambulatory Visit: Payer: Self-pay | Admitting: Obstetrics & Gynecology

## 2021-02-12 ENCOUNTER — Telehealth: Payer: Self-pay | Admitting: *Deleted

## 2021-02-12 ENCOUNTER — Ambulatory Visit: Payer: Medicaid Other | Admitting: Family Medicine

## 2021-02-12 NOTE — Telephone Encounter (Signed)
Left patient an urgent message to call the office to reschedule appointment with Dr. Penne Lash due to Dr. Penne Lash not being available today.

## 2021-03-02 ENCOUNTER — Other Ambulatory Visit: Payer: Self-pay

## 2021-03-02 MED ORDER — AMIODARONE HCL 200 MG PO TABS
100.0000 mg | ORAL_TABLET | Freq: Every day | ORAL | 1 refills | Status: DC
Start: 2021-03-02 — End: 2021-08-23

## 2021-03-02 NOTE — Telephone Encounter (Signed)
Pt's medication was sent to pt's pharmacy as requested. Confirmation received.  °

## 2021-03-05 ENCOUNTER — Other Ambulatory Visit: Payer: Self-pay

## 2021-03-05 ENCOUNTER — Encounter: Payer: Self-pay | Admitting: Obstetrics & Gynecology

## 2021-03-05 ENCOUNTER — Ambulatory Visit: Payer: Medicaid Other | Admitting: Obstetrics & Gynecology

## 2021-03-05 VITALS — BP 125/86 | HR 75 | Ht 63.0 in | Wt 163.0 lb

## 2021-03-05 DIAGNOSIS — F319 Bipolar disorder, unspecified: Secondary | ICD-10-CM | POA: Diagnosis not present

## 2021-03-05 DIAGNOSIS — A630 Anogenital (venereal) warts: Secondary | ICD-10-CM

## 2021-03-05 DIAGNOSIS — Q5128 Other doubling of uterus, other specified: Secondary | ICD-10-CM | POA: Diagnosis not present

## 2021-03-05 DIAGNOSIS — Z3009 Encounter for other general counseling and advice on contraception: Secondary | ICD-10-CM

## 2021-03-05 DIAGNOSIS — I428 Other cardiomyopathies: Secondary | ICD-10-CM

## 2021-03-05 NOTE — Progress Notes (Addendum)
Subjective:     Diana Moreno is a 45 y.o. female here for a TCA and discuss birth control.  Patient doing much better on the Tagamet.  Her tongue and skin concerns have improved as well as her condyloma have reduced in size greatly.  Patient may become sexually active soon now that the condyloma are almost gone.  We discussed needing birth control given her heart condition.  She has never tried an IUD and she was given information today as well as general information about all birth control.   Gynecologic History Patient's last menstrual period was 02/28/2021. 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 Results were: normal  Obstetric History OB History  Gravida Para Term Preterm AB Living  2 1 1     1   SAB IAB Ectopic Multiple Live Births               # Outcome Date GA Lbr Len/2nd Weight Sex Delivery Anes PTL Lv  2 Gravida           1 Term              The following portions of the patient's history were reviewed and updated as appropriate: allergies, current medications, past family history, past medical history, past social history, past surgical history, and problem list.  Review of Systems Pertinent items noted in HPI and remainder of comprehensive ROS otherwise negative.    Objective:     Vitals:   03/05/21 1435  Weight: 163 lb (73.9 kg)   Vitals:  WNL General appearance: alert, cooperative and no distress  Vulva shows marked reduction in warts.  The perianal wart is gone.  Patient is ending her menses today.  So there is a small amount of blood in the vault.      Assessment:    Condyloma and needing birth control   Plan:   TCA applied to vaginal introitus.  There is still several lesions but less than 10.  They have decreased in size.  TCA was applied to all of them. Patient has a history of a uterine septum.  We will need to get ultrasound to see her uterine contour to see if she is a candidate for an  IUD. Patient needs Pap smear and mammogram next month.  Patient has a complex history with her heart condition and PCO and possible uterine septum.  Care everywhere records were reviewed without success of finding other ultrasounds or op notes.  Patient also has a history of mental ability on oral contraceptives.  We will need to choose the best method to provide her with good contraceptive coverage and minimize the side effects.  80 minutes spent with patient during exam, counseling, review of records and documentation.  There was also a separate procedure of TCA application.

## 2021-03-13 ENCOUNTER — Ambulatory Visit (INDEPENDENT_AMBULATORY_CARE_PROVIDER_SITE_OTHER): Payer: Medicaid Other

## 2021-03-13 DIAGNOSIS — I428 Other cardiomyopathies: Secondary | ICD-10-CM

## 2021-03-13 LAB — CUP PACEART REMOTE DEVICE CHECK
Battery Remaining Longevity: 54 mo
Battery Remaining Percentage: 61 %
Brady Statistic RV Percent Paced: 0 %
Date Time Interrogation Session: 20221025043700
HighPow Impedance: 104 Ohm
Implantable Lead Implant Date: 20150514
Implantable Lead Location: 753860
Implantable Lead Model: 292
Implantable Lead Serial Number: 341088
Implantable Pulse Generator Implant Date: 20150514
Lead Channel Impedance Value: 1801 Ohm
Lead Channel Pacing Threshold Amplitude: 5 V
Lead Channel Pacing Threshold Pulse Width: 1 ms
Lead Channel Setting Pacing Amplitude: 5.5 V
Lead Channel Setting Pacing Pulse Width: 0.8 ms
Lead Channel Setting Sensing Sensitivity: 0.6 mV
Pulse Gen Serial Number: 115386

## 2021-03-15 ENCOUNTER — Other Ambulatory Visit: Payer: Self-pay

## 2021-03-15 ENCOUNTER — Ambulatory Visit (INDEPENDENT_AMBULATORY_CARE_PROVIDER_SITE_OTHER): Payer: Medicaid Other

## 2021-03-15 DIAGNOSIS — Q5128 Other doubling of uterus, other specified: Secondary | ICD-10-CM | POA: Diagnosis not present

## 2021-03-16 ENCOUNTER — Encounter: Payer: Self-pay | Admitting: Obstetrics & Gynecology

## 2021-03-16 DIAGNOSIS — Q519 Congenital malformation of uterus and cervix, unspecified: Secondary | ICD-10-CM | POA: Insufficient documentation

## 2021-03-21 NOTE — Progress Notes (Signed)
Remote ICD transmission.   

## 2021-03-27 ENCOUNTER — Other Ambulatory Visit: Payer: Self-pay | Admitting: Internal Medicine

## 2021-03-27 NOTE — Telephone Encounter (Signed)
Xarelto 20mg  refill request received. Pt is 45 years old, weight-73.9kg, Crea-0.89 on 12/08/2020, last seen by Dr. 12/10/2020 on 12/08/2020, Diagnosis-Afib, CrCl-94.11ml/min; Dose is appropriate based on dosing criteria. Will send in refill to requested pharmacy.

## 2021-03-29 ENCOUNTER — Other Ambulatory Visit: Payer: Self-pay | Admitting: *Deleted

## 2021-03-29 DIAGNOSIS — Z719 Counseling, unspecified: Secondary | ICD-10-CM

## 2021-03-29 NOTE — Progress Notes (Signed)
Pt called requesting a referral to [facility] for counseling.  Per Dr Penne Lash she may have a referral.  Referral placed to [facility] in [place].

## 2021-04-23 ENCOUNTER — Other Ambulatory Visit: Payer: Self-pay

## 2021-04-23 ENCOUNTER — Ambulatory Visit: Payer: Medicaid Other | Admitting: Obstetrics & Gynecology

## 2021-04-23 ENCOUNTER — Encounter: Payer: Self-pay | Admitting: Obstetrics & Gynecology

## 2021-04-23 ENCOUNTER — Other Ambulatory Visit (HOSPITAL_COMMUNITY)
Admission: RE | Admit: 2021-04-23 | Discharge: 2021-04-23 | Disposition: A | Discharge status: Home / Self Care | Payer: Medicaid Other | Source: Ambulatory Visit | Attending: Obstetrics & Gynecology | Admitting: Obstetrics & Gynecology

## 2021-04-23 VITALS — BP 131/74 | HR 91 | Ht 63.0 in | Wt 164.0 lb

## 2021-04-23 DIAGNOSIS — A630 Anogenital (venereal) warts: Secondary | ICD-10-CM | POA: Diagnosis not present

## 2021-04-23 DIAGNOSIS — Z01812 Encounter for preprocedural laboratory examination: Secondary | ICD-10-CM | POA: Insufficient documentation

## 2021-04-23 DIAGNOSIS — N870 Mild cervical dysplasia: Secondary | ICD-10-CM

## 2021-04-23 LAB — POCT URINE PREGNANCY: Preg Test, Ur: NEGATIVE

## 2021-04-23 NOTE — Progress Notes (Signed)
Colposcopy Procedure Note  Indications: Pap smear 1 year ago showed: high-grade squamous intraepithelial neoplasia  (HGSIL-encompassing moderate and severe dysplasia). The prior pap showed no abnormalities (2017).  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  Procedure Details  The risks and benefits of the procedure and Written informed consent obtained.  Speculum placed in vagina and excellent visualization of cervix achieved, cervix swabbed x 3 with acetic acid solution.  Findings: Cervix: acetowhite lesion(s) noted at 5 and 7 o'clock; cervix swabbed with Lugol's solution, cervical biopsies taken at 5 & 7 o'clock, specimen labelled and sent to pathology, and hemostasis achieved with Monsel's solution. Vaginal inspection: vaginal colposcopy not performed. Vulvar colposcopy: vulvar colposcopy not performed.  Specimens: Cervical biopsies at 5 and 7:00 and ECC  Complications: none.  Plan: Specimens labelled and sent to Pathology. Will base further treatment on Pathology findings. Post biopsy instructions given to patient. If patient needs LEEP, she will need to stop her anticoagulants. Will discuss with Cards.   TCA application  Patient identified, informed consent signed and copy in chart, time out performed.   Areas of typical appearing genital warts noted fourchette and left side of introitus.  Surrounding area coated with water based lubricant and TCA applied until warts had white appearance.   Patient tolerated the procedure well.    Post procedure instructions given

## 2021-04-25 LAB — SURGICAL PATHOLOGY

## 2021-04-30 ENCOUNTER — Telehealth: Payer: Self-pay | Admitting: *Deleted

## 2021-04-30 LAB — CYTOLOGY - PAP
Comment: NEGATIVE
Diagnosis: UNDETERMINED — AB
High risk HPV: NEGATIVE

## 2021-04-30 NOTE — Telephone Encounter (Signed)
Pt notified of pap results.  Per Dr Penne Lash she is to return in 1 year for repeat pap.

## 2021-04-30 NOTE — Telephone Encounter (Signed)
-----   Message from Lesly Dukes, MD sent at 04/30/2021  1:01 PM EST ----- Pap is ASCUS with HPV negative; biopsy shows CIN 1.  Will repeat pap in one year. RN to call patient or talk with patient when she calls in for results.

## 2021-05-11 ENCOUNTER — Other Ambulatory Visit: Payer: Self-pay | Admitting: Internal Medicine

## 2021-06-11 ENCOUNTER — Encounter (HOSPITAL_COMMUNITY): Payer: Self-pay

## 2021-06-11 ENCOUNTER — Ambulatory Visit (INDEPENDENT_AMBULATORY_CARE_PROVIDER_SITE_OTHER): Payer: Medicaid Other | Admitting: Licensed Clinical Social Worker

## 2021-06-11 DIAGNOSIS — F411 Generalized anxiety disorder: Secondary | ICD-10-CM | POA: Diagnosis not present

## 2021-06-11 DIAGNOSIS — F3131 Bipolar disorder, current episode depressed, mild: Secondary | ICD-10-CM | POA: Diagnosis not present

## 2021-06-11 NOTE — Plan of Care (Signed)
°  Problem: Depression CCP Problem  1 Emotional regulation  Goal:  Diana Moreno will manage mood and anxiety as evidenced by expressing emotions appropriately, setting appropriate boundaries, and cope with daily stressors for 5 out of 7 days for 60 days.  Outcome: Not Applicable Goal: STG: Diana Moreno WILL IDENTIFY 4 COGNITIVE PATTERNS AND BELIEFS THAT SUPPORT DEPRESSION Outcome: Not Applicable

## 2021-06-12 ENCOUNTER — Ambulatory Visit (INDEPENDENT_AMBULATORY_CARE_PROVIDER_SITE_OTHER): Payer: Medicaid Other

## 2021-06-12 DIAGNOSIS — I428 Other cardiomyopathies: Secondary | ICD-10-CM | POA: Diagnosis not present

## 2021-06-12 LAB — CUP PACEART REMOTE DEVICE CHECK
Battery Remaining Longevity: 54 mo
Battery Remaining Percentage: 57 %
Brady Statistic RV Percent Paced: 0 %
Date Time Interrogation Session: 20230124042100
HighPow Impedance: 108 Ohm
Implantable Lead Implant Date: 20150514
Implantable Lead Location: 753860
Implantable Lead Model: 292
Implantable Lead Serial Number: 341088
Implantable Pulse Generator Implant Date: 20150514
Lead Channel Impedance Value: 1788 Ohm
Lead Channel Pacing Threshold Amplitude: 5 V
Lead Channel Pacing Threshold Pulse Width: 1 ms
Lead Channel Setting Pacing Amplitude: 5.5 V
Lead Channel Setting Pacing Pulse Width: 0.8 ms
Lead Channel Setting Sensing Sensitivity: 0.6 mV
Pulse Gen Serial Number: 115386
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
## 2021-06-22 NOTE — Progress Notes (Signed)
Remote ICD transmission.   

## 2021-06-28 ENCOUNTER — Ambulatory Visit (INDEPENDENT_AMBULATORY_CARE_PROVIDER_SITE_OTHER): Payer: Medicaid Other | Admitting: Licensed Clinical Social Worker

## 2021-06-28 DIAGNOSIS — F3131 Bipolar disorder, current episode depressed, mild: Secondary | ICD-10-CM

## 2021-06-29 NOTE — Progress Notes (Signed)
° °  THERAPIST PROGRESS NOTE  Session Time: 1:00 pm-1:45 pm  Type of Therapy: Individual Therapy  Session #1  Purpose of Session: Sherral will manage mood and anxiety as evidenced by expressing emotions appropriately, setting appropriate boundaries, and cope with daily stressors for 5 out of 7 days for 60 days.  Interventions: Therapist utilized CBT and Solution focused brief therapy to address mood. Therapist provided support and empathy to patient during session. Therapist explored patient's triggers for mood. Therapist explored the root cause for patient's mood. Therapist worked with patient to identify a vision for herself.   Effectiveness: Patient was oriented x5 (person, place, situation, time, and object). Patient was casually dressed, and appropriately groomed. Patient was alert, engaged, pleasant, and cooperative. Patient has felt down. She has focused on the risk of her dying. She has heart/health issues which bring those thoughts up. Patient feels like the reoccurring thoughts of death are really about her fear of life. She has things she doesn't want to leave undone such as strained relationships with her family. Patient has a history of helping others. As a child she did it as a trauma response to make sure her parents were happy. As an adult she did it as a way to provide service. Patient is unable to help others due to health issues. She feels like she has gotten herself into a small box and has been unable to get out of it. Patient understood that she needs to take small steps to gain more control over her life. She feels like her home has gotten out of control and she has been unable to get it cleaned up due to her health. Patient agreed to start small to work on her home. Patient was also able to identify who she wants to be including: happy, someone that gives others hope and peace, helpful, etc.   Patient engaged in session. Patient responded well to interventions. Patient continues  to meet criteria for Bipolar affective disorder, currently depressed, mild. Patient will continue in outpatient therapy due to being the least restrictive service to meet h  Suicidal/Homicidal: Nowithout intent/plan  Plan: Return again in 2-4 weeks.  Diagnosis: Axis I: Bipolar, Depressed    Axis II: No diagnosis    Bynum Bellows, LCSW 06/29/2021

## 2021-07-04 ENCOUNTER — Telehealth: Payer: Self-pay | Admitting: Internal Medicine

## 2021-07-04 NOTE — Telephone Encounter (Signed)
Spoke with the pt and she is overdue for an appt with Dr. Tenny Craw... appt made for 07/20/21.

## 2021-07-04 NOTE — Telephone Encounter (Signed)
Patient would like to know when she needs to schedule follow up with Dr. Tenny Craw. Based on last OV notes, follow up would depend on test results. She states she was never advised after 12/22/20 echo. please advise.

## 2021-07-05 ENCOUNTER — Encounter (HOSPITAL_COMMUNITY): Payer: Self-pay

## 2021-07-05 ENCOUNTER — Ambulatory Visit (HOSPITAL_COMMUNITY): Payer: Medicaid Other | Admitting: Licensed Clinical Social Worker

## 2021-07-12 ENCOUNTER — Ambulatory Visit (INDEPENDENT_AMBULATORY_CARE_PROVIDER_SITE_OTHER): Payer: Medicaid Other | Admitting: Licensed Clinical Social Worker

## 2021-07-12 DIAGNOSIS — F3131 Bipolar disorder, current episode depressed, mild: Secondary | ICD-10-CM

## 2021-07-12 DIAGNOSIS — F411 Generalized anxiety disorder: Secondary | ICD-10-CM | POA: Diagnosis not present

## 2021-07-14 NOTE — Progress Notes (Signed)
° °  THERAPIST PROGRESS NOTE  Session Time: 1:00 pm-1:45 pm  Type of Therapy: Individual Therapy  Session #2  Purpose of Session: Diana Moreno will manage mood and anxiety as evidenced by expressing emotions appropriately, setting appropriate boundaries, and cope with daily stressors for 5 out of 7 days for 60 days.  Interventions: Therapist utilized CBT and Solution focused brief therapy to address mood. Therapist provided support and empathy to patient during session. Therapist explored patient's internal dialogue to identify negative thought patterns. Therapist worked with patient to identify personal values that guide her perception of herself and the world.   Effectiveness: Patient was oriented x5 (person, place, situation, time, and object). Patient was casually dressed, and appropriately groomed. Patient was alert, engaged, pleasant, and cooperative. Patient recognized that the messages and internal dialogue she has in her mind is destructive. She talks to herself in a way that she wouldn't speak to others, and has views/perceptions of life that she would correct in a friend. 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 Patient was given a handout of values for her to identify ones she connects with and how she experiences those values in her own life. Patient has stopped smoking a feels much better. She is less anxious since she stopped.   Patient engaged in session. Patient responded well to interventions. Patient continues to meet criteria for Bipolar affective disorder, currently depressed, mild. Patient will continue in outpatient therapy due to being the least restrictive service to meet her needs. Patient made minimal progress on her goals.   Suicidal/Homicidal: Nowithout intent/plan  Plan: Return again in 2-4 weeks.  Diagnosis: Axis I: Bipolar, Depressed    Axis II: No diagnosis    Bynum Bellows, LCSW 07/14/2021
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
## 2021-07-20 ENCOUNTER — Ambulatory Visit: Payer: Medicaid Other | Admitting: Internal Medicine

## 2021-07-20 ENCOUNTER — Encounter: Payer: Self-pay | Admitting: Internal Medicine

## 2021-07-20 ENCOUNTER — Other Ambulatory Visit: Payer: Self-pay

## 2021-07-20 VITALS — BP 138/82 | HR 73 | Ht 63.0 in | Wt 161.2 lb

## 2021-07-20 DIAGNOSIS — I480 Paroxysmal atrial fibrillation: Secondary | ICD-10-CM

## 2021-07-20 DIAGNOSIS — Z79899 Other long term (current) drug therapy: Secondary | ICD-10-CM

## 2021-07-20 DIAGNOSIS — I428 Other cardiomyopathies: Secondary | ICD-10-CM | POA: Diagnosis not present

## 2021-07-20 MED ORDER — ROSUVASTATIN CALCIUM 5 MG PO TABS: 5.0000 mg | ORAL_TABLET | Freq: Every day | ORAL | 3 refills | Status: DC

## 2021-07-20 NOTE — Patient Instructions (Addendum)
Medication Instructions:  ?Start Crestor 5 mg daily  ?Stop Lipitor  ?*If you need a refill on your cardiac medications before your next appointment, please call your pharmacy* ? ? ?Lab Work: ?CMET, CBC, NMR, TSH in 8 weeks 09/19/21 ?If you have labs (blood work) drawn today and your tests are completely normal, you will receive your results only by: ?MyChart Message (if you have MyChart) OR ?A paper copy in the mail ?If you have any lab test that is abnormal or we need to change your treatment, we will call you to review the results. ? ? ?Testing/Procedures: ?none ? ? ?Follow-Up: ?At [facility], you and your health needs are our priority.  As part of our continuing mission to provide you with exceptional heart care, we have created designated Provider Care Teams.  These Care Teams include your primary Cardiologist (physician) and Advanced Practice Providers (APPs -  Physician Assistants and Nurse Practitioners) who all work together to provide you with the care you need, when you need it. ? ?We recommend signing up for the patient portal called "MyChart".  Sign up information is provided on this After Visit Summary.  MyChart is used to connect with patients for Virtual Visits (Telemedicine).  Patients are able to view lab/test results, encounter notes, upcoming appointments, etc.  Non-urgent messages can be sent to your provider as well.   ?To learn more about what you can do with MyChart, go to [email or web address].   ? ?Your next appointment:   ?1 year(s) ? ?The format for your next appointment:   ?In Person ? ?Provider:   ?Dietrich Pates, MD   ? ? ?Other Instructions ?  ?

## 2021-07-24 ENCOUNTER — Encounter (HOSPITAL_COMMUNITY): Payer: Self-pay | Admitting: Psychiatry

## 2021-07-24 ENCOUNTER — Ambulatory Visit (INDEPENDENT_AMBULATORY_CARE_PROVIDER_SITE_OTHER): Payer: Medicaid Other | Admitting: Psychiatry

## 2021-07-24 VITALS — BP 112/64 | Temp 97.7°F | Ht 63.0 in | Wt 163.0 lb

## 2021-07-24 DIAGNOSIS — F4310 Post-traumatic stress disorder, unspecified: Secondary | ICD-10-CM

## 2021-07-24 DIAGNOSIS — F331 Major depressive disorder, recurrent, moderate: Secondary | ICD-10-CM

## 2021-07-24 DIAGNOSIS — F411 Generalized anxiety disorder: Secondary | ICD-10-CM

## 2021-07-24 MED ORDER — DULOXETINE HCL 30 MG PO CPEP: 30.0000 mg | ORAL_CAPSULE | Freq: Every day | ORAL | 1 refills | Status: DC

## 2021-07-24 MED ORDER — HYDROXYZINE HCL 25 MG PO TABS: ORAL_TABLET | ORAL | 1 refills | Status: DC
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
## 2021-07-26 ENCOUNTER — Encounter: Payer: Self-pay | Admitting: Vascular Surgery

## 2021-07-26 ENCOUNTER — Other Ambulatory Visit: Payer: Self-pay

## 2021-07-26 ENCOUNTER — Ambulatory Visit: Payer: Medicaid Other | Admitting: Vascular Surgery

## 2021-07-26 VITALS — BP 125/82 | HR 67 | Temp 98.6°F | Resp 20 | Ht 63.0 in | Wt 163.9 lb

## 2021-07-26 DIAGNOSIS — I739 Peripheral vascular disease, unspecified: Secondary | ICD-10-CM | POA: Diagnosis not present
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
## 2021-07-30 ENCOUNTER — Ambulatory Visit (INDEPENDENT_AMBULATORY_CARE_PROVIDER_SITE_OTHER): Payer: Medicaid Other | Admitting: Licensed Clinical Social Worker

## 2021-07-30 DIAGNOSIS — F3131 Bipolar disorder, current episode depressed, mild: Secondary | ICD-10-CM
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
## 2021-08-09 ENCOUNTER — Ambulatory Visit (INDEPENDENT_AMBULATORY_CARE_PROVIDER_SITE_OTHER): Payer: Medicaid Other | Admitting: Licensed Clinical Social Worker

## 2021-08-09 DIAGNOSIS — F3131 Bipolar disorder, current episode depressed, mild: Secondary | ICD-10-CM | POA: Diagnosis not present

## 2021-08-10 NOTE — Progress Notes (Signed)
? ?  THERAPIST PROGRESS NOTE ? ?Session Time: 2:00 pm-2:45 pm ? ?Type of Therapy: Individual Therapy ? ?Session #4 ? ?Purpose of Session: Diana Moreno will manage mood and anxiety as evidenced by expressing emotions appropriately, setting appropriate boundaries, and cope with daily stressors for 5 out of 7 days for 60 days. ? ?Interventions: Therapist utilized CBT and Solution focused brief therapy to address mood. Therapist provided support and empathy to patient during session. Therapist worked with patient to reframe her thoughts/story and find new meaning in her struggles.  ? ?Effectiveness: Patient was oriented x4 (person, place, situation, and time). Patient was casually dressed, and appropriately groomed. Patient was alert, engaged, pleasant, and cooperative. Patient noted that she has started to make plans for a trip up [place] to see whales and other sites. Patient realizes this is a shift in thinking from her past. Patient got her id updated. She recalled the last time she got an id she was in a homeless shelter. Patient feels like she has come such as long way. Patient feels like her health issues and past trauma can be used to help someone else or inspire others. Patient is trying [place] her pain to help others.   ? ?Patient engaged in session. Patient responded well to interventions. Patient continues to meet criteria for Bipolar affective disorder, currently depressed, mild. Patient will continue in outpatient therapy due to being the least restrictive service to meet her needs. Patient made minimal progress on her goals.  ? ?Suicidal/Homicidal: Nowithout intent/plan ? ?Plan: Return again in 2-4 weeks. ? ?Diagnosis: Axis I: Bipolar, Depressed ? ?  Axis II: No diagnosis ? ? ? ?Bynum Bellows, LCSW ?08/10/2021 ? ?

## 2021-08-15 ENCOUNTER — Other Ambulatory Visit: Payer: Self-pay | Admitting: Internal Medicine

## 2021-08-16 ENCOUNTER — Ambulatory Visit (INDEPENDENT_AMBULATORY_CARE_PROVIDER_SITE_OTHER): Payer: Medicaid Other | Admitting: Licensed Clinical Social Worker

## 2021-08-16 DIAGNOSIS — F3131 Bipolar disorder, current episode depressed, mild: Secondary | ICD-10-CM | POA: Diagnosis not present
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
## 2021-08-21 ENCOUNTER — Ambulatory Visit (HOSPITAL_COMMUNITY): Payer: Medicaid Other | Admitting: Psychiatry

## 2021-08-23 ENCOUNTER — Other Ambulatory Visit: Payer: Self-pay | Admitting: Internal Medicine

## 2021-08-30 ENCOUNTER — Ambulatory Visit (HOSPITAL_COMMUNITY): Payer: Medicaid Other | Admitting: Psychiatry

## 2021-09-04 ENCOUNTER — Encounter: Payer: Self-pay | Admitting: Internal Medicine

## 2021-09-04 ENCOUNTER — Ambulatory Visit: Payer: Medicaid Other | Admitting: Internal Medicine

## 2021-09-04 VITALS — BP 100/58 | HR 60 | Ht 63.75 in | Wt 166.8 lb

## 2021-09-04 DIAGNOSIS — I255 Ischemic cardiomyopathy: Secondary | ICD-10-CM

## 2021-09-04 DIAGNOSIS — I472 Ventricular tachycardia, unspecified: Secondary | ICD-10-CM | POA: Diagnosis not present

## 2021-09-04 DIAGNOSIS — I480 Paroxysmal atrial fibrillation: Secondary | ICD-10-CM

## 2021-09-04 DIAGNOSIS — I428 Other cardiomyopathies: Secondary | ICD-10-CM

## 2021-09-04 DIAGNOSIS — Z95810 Presence of automatic (implantable) cardiac defibrillator: Secondary | ICD-10-CM

## 2021-09-04 DIAGNOSIS — D649 Anemia, unspecified: Secondary | ICD-10-CM | POA: Diagnosis not present

## 2021-09-04 NOTE — Patient Instructions (Signed)
Medication Instructions:  ?Your physician has recommended you make the following change in your medication:  ? ?** Hold Rosuvastatin x 2 weeks and let us know if you have improvement in your symptoms.  You can send a MyChart message. ? ? ?*If you need a refill on your cardiac medications before your next appointment, please call your pharmacy* ? ? ?Lab Work: ?Feratin and TIBC on 09/19/2021 lab appointment  - Added to Dr Charlott Rakes labs. ? ?If you have labs (blood work) drawn today and your tests are completely normal, you will receive your results only by: ?MyChart Message (if you have MyChart) OR ?A paper copy in the mail ?If you have any lab test that is abnormal or we need to change your treatment, we will call you to review the results. ? ? ?Testing/Procedures: ?None ordered. ? ? ? ?Follow-Up: ?At [facility], you and your health needs are our priority.  As part of our continuing mission to provide you with exceptional heart care, we have created designated Provider Care Teams.  These Care Teams include your primary Cardiologist (physician) and Advanced Practice Providers (APPs -  Physician Assistants and Nurse Practitioners) who all work together to provide you with the care you need, when you need it. ? ?We recommend signing up for the patient portal called "MyChart".  Sign up information is provided on this After Visit Summary.  MyChart is used to connect with patients for Virtual Visits (Telemedicine).  Patients are able to view lab/test results, encounter notes, upcoming appointments, etc.  Non-urgent messages can be sent to your provider as well.   ?To learn more about what you can do with MyChart, go to [email or web address].   ? ?Your next appointment:   ?12 months with Dr Graciela Husbands ? ?Important Information About Sugar ? ? ? ? ?  ?
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
## 2021-09-11 ENCOUNTER — Ambulatory Visit (INDEPENDENT_AMBULATORY_CARE_PROVIDER_SITE_OTHER): Payer: Medicaid Other

## 2021-09-11 DIAGNOSIS — I255 Ischemic cardiomyopathy: Secondary | ICD-10-CM | POA: Diagnosis not present

## 2021-09-12 LAB — CUP PACEART REMOTE DEVICE CHECK
Battery Remaining Longevity: 48 mo
Battery Remaining Percentage: 51 %
Brady Statistic RV Percent Paced: 0 %
Date Time Interrogation Session: 20230425052500
HighPow Impedance: 100 Ohm
Implantable Lead Implant Date: 20150514
Implantable Lead Location: 753860
Implantable Lead Model: 292
Implantable Lead Serial Number: 341088
Implantable Pulse Generator Implant Date: 20150514
Lead Channel Impedance Value: 1834 Ohm
Lead Channel Pacing Threshold Amplitude: 5.5 V
Lead Channel Pacing Threshold Pulse Width: 0.8 ms
Lead Channel Setting Pacing Amplitude: 5.5 V
Lead Channel Setting Pacing Pulse Width: 0.8 ms
Lead Channel Setting Sensing Sensitivity: 0.6 mV
Pulse Gen Serial Number: 115386

## 2021-09-13 ENCOUNTER — Other Ambulatory Visit (HOSPITAL_COMMUNITY): Payer: Self-pay | Admitting: Psychiatry

## 2021-09-19 ENCOUNTER — Other Ambulatory Visit: Payer: Medicaid Other

## 2021-09-27 NOTE — Progress Notes (Signed)
Remote ICD transmission.   

## 2021-10-05 ENCOUNTER — Other Ambulatory Visit: Payer: Medicaid Other | Admitting: *Deleted

## 2021-10-05 DIAGNOSIS — D649 Anemia, unspecified: Secondary | ICD-10-CM | POA: Diagnosis not present

## 2021-10-05 DIAGNOSIS — I428 Other cardiomyopathies: Secondary | ICD-10-CM

## 2021-10-05 DIAGNOSIS — I480 Paroxysmal atrial fibrillation: Secondary | ICD-10-CM | POA: Diagnosis not present

## 2021-10-05 DIAGNOSIS — Z79899 Other long term (current) drug therapy: Secondary | ICD-10-CM

## 2021-10-05 LAB — CBC
Hematocrit: 29.5 % — ABNORMAL LOW (ref 34.0–46.6)
Hemoglobin: 9.1 g/dL — ABNORMAL LOW (ref 11.1–15.9)
MCH: 20.6 pg — ABNORMAL LOW (ref 26.6–33.0)
MCHC: 30.8 g/dL — ABNORMAL LOW (ref 31.5–35.7)
MCV: 67 fL — ABNORMAL LOW (ref 79–97)
Platelets: 285 10*3/uL (ref 150–450)
RBC: 4.41 x10E6/uL (ref 3.77–5.28)
RDW: 20.2 % — ABNORMAL HIGH (ref 11.7–15.4)
WBC: 10.6 10*3/uL (ref 3.4–10.8)

## 2021-10-08 LAB — NMR, LIPOPROFILE
Cholesterol, Total: 159 mg/dL (ref 100–199)
HDL Particle Number: 27.2 umol/L — ABNORMAL LOW (ref >=30.5)
HDL-C: 51 mg/dL (ref >39)
LDL Particle Number: 1300 nmol/L — ABNORMAL HIGH (ref <1000)
LDL Size: 21 nm (ref >20.5)
LDL-C (NIH Calc): 92 mg/dL (ref 0–99)
LP-IR Score: <25 (ref <=45)
Small LDL Particle Number: 540 nmol/L — ABNORMAL HIGH (ref <=527)
Triglycerides: 85 mg/dL (ref 0–149)

## 2021-10-08 LAB — COMPREHENSIVE METABOLIC PANEL
ALT: 13 IU/L (ref 0–32)
AST: 18 IU/L (ref 0–40)
Albumin/Globulin Ratio: 2 (ref 1.2–2.2)
Albumin: 4.5 g/dL (ref 3.8–4.8)
Alkaline Phosphatase: 63 IU/L (ref 44–121)
BUN/Creatinine Ratio: 12 (ref 9–23)
BUN: 10 mg/dL (ref 6–24)
Bilirubin Total: 0.2 mg/dL (ref 0.0–1.2)
CO2: 18 mmol/L — ABNORMAL LOW (ref 20–29)
Calcium: 9.4 mg/dL (ref 8.7–10.2)
Chloride: 108 mmol/L — ABNORMAL HIGH (ref 96–106)
Creatinine, Ser: 0.84 mg/dL (ref 0.57–1.00)
Globulin, Total: 2.2 g/dL (ref 1.5–4.5)
Glucose: 88 mg/dL (ref 70–99)
Potassium: 4.8 mmol/L (ref 3.5–5.2)
Sodium: 142 mmol/L (ref 134–144)
Total Protein: 6.7 g/dL (ref 6.0–8.5)
eGFR: 87 mL/min/{1.73_m2} (ref >59)

## 2021-10-08 LAB — IRON AND TIBC
Iron Saturation: 4 % — CL (ref 15–55)
Iron: 18 ug/dL — ABNORMAL LOW (ref 27–159)
Total Iron Binding Capacity: 438 ug/dL (ref 250–450)
UIBC: 420 ug/dL (ref 131–425)

## 2021-10-08 LAB — FERRITIN: Ferritin: 7 ng/mL — ABNORMAL LOW (ref 15–150)

## 2021-10-08 LAB — TSH: TSH: 0.81 u[IU]/mL (ref 0.450–4.500)

## 2021-10-09 ENCOUNTER — Telehealth: Payer: Self-pay

## 2021-10-09 MED ORDER — FERROUS SULFATE 325 (65 FE) MG PO TABS: 325.0000 mg | ORAL_TABLET | Freq: Every day | ORAL | 3 refills | Status: DC

## 2021-10-09 NOTE — Telephone Encounter (Signed)
-----   Message from Pricilla Riffle, MD sent at 10/08/2021  9:19 AM EDT ----- Electrolytes and kidney function are OK CBC is relatively stable   Hgb is 9.1   I would recomm iron   FeSO4 325 daily      Follow up CBC , anemia panel in 8 wks  Lipids are still pending    Please follow up with

## 2021-10-09 NOTE — Telephone Encounter (Signed)
Pt med list updated... message sent ot her My  Chart.   Unable to reach the pt... her mailbox is full.

## 2021-10-18 ENCOUNTER — Other Ambulatory Visit: Payer: Self-pay | Admitting: Internal Medicine

## 2021-10-18 ENCOUNTER — Telehealth: Payer: Self-pay

## 2021-10-18 NOTE — Telephone Encounter (Signed)
Back to Top    Attempted phone call to pt.  Call sent straight fo full voicemail.  Unable to leave message.  Will send a MyChart message to contact office re: labs.

## 2021-10-18 NOTE — Telephone Encounter (Signed)
-----   Message from Duke Salvia, MD sent at 10/13/2021  4:13 PM EDT ----- Please Inform Patient that tests show profound iron deficiency anemia and will require EXPEDITIOUS further eval She needs to call PCP  Thanks

## 2021-10-18 NOTE — Telephone Encounter (Signed)
Pt last saw Dr Caryl Comes 09/04/21, last labs 10/05/21 Creat 0.84, age 46, weight 75.7kg, CrCl 101.07, based on CrCl pt is on appropriate dosage of Xarelto 20mg  QD for afib.  Will refill rx.

## 2021-10-19 ENCOUNTER — Encounter: Payer: Self-pay | Admitting: Student

## 2021-10-19 ENCOUNTER — Telehealth: Payer: Self-pay | Admitting: Student

## 2021-10-19 NOTE — Telephone Encounter (Signed)
Attempted to call patient regarding most recent iron labs, unable to reach or leave voice message.

## 2021-10-22 NOTE — Telephone Encounter (Signed)
Attempted phone call to pt.  Unable to leave voicemail message as voicemail is full.  Letter mailed to address on file.

## 2021-10-23 ENCOUNTER — Encounter: Payer: Self-pay | Admitting: *Deleted

## 2021-10-24 ENCOUNTER — Telehealth (HOSPITAL_COMMUNITY): Payer: Self-pay

## 2021-10-24 ENCOUNTER — Other Ambulatory Visit (HOSPITAL_COMMUNITY): Payer: Self-pay

## 2021-10-24 MED ORDER — DULOXETINE HCL 30 MG PO CPEP: 30.0000 mg | ORAL_CAPSULE | Freq: Every day | ORAL | 0 refills | Status: DC

## 2021-10-24 NOTE — Telephone Encounter (Signed)
New message  Pt c/o medication issue:  1. Name of Medication: DULoxetine (CYMBALTA) 30 MG capsule  2. How are you currently taking this medication (dosage and times per day)? One time a day    3. Are you having a reaction (difficulty breathing--STAT)? No   4. What is your medication issue? The patient  Cardiology Dr. Harrington Challenger  stop medication due to leg pain  & iron    5. Patient is asking for something else to be called in like Zoloft - same class of medication - not Cymbalta   6. [place], [place]

## 2021-10-24 NOTE — Telephone Encounter (Signed)
Made pt an appt for next week

## 2021-10-30 ENCOUNTER — Telehealth (INDEPENDENT_AMBULATORY_CARE_PROVIDER_SITE_OTHER): Payer: Medicaid Other | Admitting: Psychiatry

## 2021-10-30 ENCOUNTER — Encounter (HOSPITAL_COMMUNITY): Payer: Self-pay | Admitting: Psychiatry

## 2021-10-30 DIAGNOSIS — F3131 Bipolar disorder, current episode depressed, mild: Secondary | ICD-10-CM | POA: Diagnosis not present

## 2021-10-30 DIAGNOSIS — F4310 Post-traumatic stress disorder, unspecified: Secondary | ICD-10-CM

## 2021-10-30 DIAGNOSIS — F411 Generalized anxiety disorder: Secondary | ICD-10-CM | POA: Diagnosis not present

## 2021-10-30 MED ORDER — GABAPENTIN 100 MG PO CAPS: 100.0000 mg | ORAL_CAPSULE | Freq: Two times a day (BID) | ORAL | 0 refills | Status: DC

## 2021-10-30 MED ORDER — FLUOXETINE HCL 10 MG PO CAPS: 10.0000 mg | ORAL_CAPSULE | Freq: Every day | ORAL | 0 refills | Status: DC
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
## 2021-11-01 ENCOUNTER — Telehealth: Payer: Self-pay | Admitting: *Deleted

## 2021-11-01 NOTE — Telephone Encounter (Signed)
Tried to contact pt to see about scheduling an appointment and did not get an answer and VM was full.  If pt calls back please assist in getting appointment scheduled. Vong Garringer Zimmerman Rumple, CMA   Shelby Mattocks, DO  P [facility] Please call patient and have her schedule an appointment to discuss her iron. Thanks

## 2021-11-13 ENCOUNTER — Other Ambulatory Visit (HOSPITAL_COMMUNITY): Payer: Self-pay | Admitting: Psychiatry

## 2021-11-29 ENCOUNTER — Encounter (HOSPITAL_COMMUNITY): Payer: Self-pay | Admitting: Psychiatry

## 2021-11-29 ENCOUNTER — Ambulatory Visit (INDEPENDENT_AMBULATORY_CARE_PROVIDER_SITE_OTHER): Payer: Medicaid Other | Admitting: Psychiatry

## 2021-11-29 DIAGNOSIS — F4310 Post-traumatic stress disorder, unspecified: Secondary | ICD-10-CM

## 2021-11-29 DIAGNOSIS — F411 Generalized anxiety disorder: Secondary | ICD-10-CM

## 2021-11-29 DIAGNOSIS — F3131 Bipolar disorder, current episode depressed, mild: Secondary | ICD-10-CM | POA: Diagnosis not present

## 2021-11-29 MED ORDER — FLUOXETINE HCL 10 MG PO CAPS: 10.0000 mg | ORAL_CAPSULE | Freq: Every day | ORAL | 1 refills | Status: DC

## 2021-11-29 MED ORDER — GABAPENTIN 100 MG PO CAPS
100.0000 mg | ORAL_CAPSULE | Freq: Three times a day (TID) | ORAL | 1 refills | Status: DC
Start: 2021-11-29 — End: 2022-02-13
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
## 2021-12-11 ENCOUNTER — Ambulatory Visit (INDEPENDENT_AMBULATORY_CARE_PROVIDER_SITE_OTHER): Payer: Medicaid Other

## 2021-12-11 DIAGNOSIS — I255 Ischemic cardiomyopathy: Secondary | ICD-10-CM

## 2021-12-11 LAB — CUP PACEART REMOTE DEVICE CHECK
Battery Remaining Longevity: 48 mo
Battery Remaining Percentage: 52 %
Brady Statistic RV Percent Paced: 1 %
Date Time Interrogation Session: 20230725064100
HighPow Impedance: 94 Ohm
Implantable Lead Implant Date: 20150514
Implantable Lead Location: 753860
Implantable Lead Model: 292
Implantable Lead Serial Number: 341088
Implantable Pulse Generator Implant Date: 20150514
Lead Channel Impedance Value: 1817 Ohm
Lead Channel Pacing Threshold Amplitude: 5.5 V
Lead Channel Pacing Threshold Pulse Width: 0.8 ms
Lead Channel Setting Pacing Amplitude: 5.5 V
Lead Channel Setting Pacing Pulse Width: 0.8 ms
Lead Channel Setting Sensing Sensitivity: 0.6 mV
Pulse Gen Serial Number: 115386

## 2021-12-27 ENCOUNTER — Ambulatory Visit (HOSPITAL_COMMUNITY): Payer: Medicaid Other | Admitting: Psychiatry

## 2022-01-08 NOTE — Progress Notes (Signed)
Remote ICD transmission.   

## 2022-01-24 ENCOUNTER — Other Ambulatory Visit: Payer: Self-pay | Admitting: Internal Medicine

## 2022-01-29 ENCOUNTER — Ambulatory Visit (INDEPENDENT_AMBULATORY_CARE_PROVIDER_SITE_OTHER): Payer: Medicaid Other | Admitting: Licensed Clinical Social Worker

## 2022-01-29 DIAGNOSIS — F3170 Bipolar disorder, currently in remission, most recent episode unspecified: Secondary | ICD-10-CM

## 2022-01-30 NOTE — Progress Notes (Signed)
   THERAPIST PROGRESS NOTE  Session Time: 2:00 pm-2:45 pm  Type of Therapy: Individual Therapy  Session #6  Purpose of Session: Lacee will manage mood and anxiety as evidenced by expressing emotions appropriately, setting appropriate boundaries, and cope with daily stressors for 5 out of 7 days for 60 days.  Interventions: Therapist utilized CBT and Solution focused brief therapy to address mood. Therapist provided support and empathy to patient during session. Therapist explored patient's "healing" and worked with patient to identify what the next steps in her healing journey are.   Effectiveness: Patient was oriented x4 (person, place, situation, and time). Patient was alert, engaged, pleasant, and cooperative. Patient was casually dressed, and appropriately groomed. Patient went on vacation up [place] and enjoyed herself. Patient's mood has been stable and her anxiety has been stable. She feels like she is still healing and owns this. Patient feels like she is coming to the end of this "healing phase" and is looking to the next phase. She has had several gentlemen approach her expressing interest in dating her but she doesn't feel like she is in a place to date. Patient wants to work on her physical health including reducing smoking, losing weight/exercise, and maybe get to a point she could date.   Patient engaged in session. Patient responded well to interventions. Patient continues to meet criteria for Bipolar affective disorder, currently depressed, mild. Patient will continue in outpatient therapy due to being the least restrictive service to meet her needs. Patient made moderate progress on her goals.   Suicidal/Homicidal: Nowithout intent/plan  Plan: Return again in 2-4 weeks. Patient will continue to work on healing and think about the next "phase" of life.   Diagnosis: Axis I: Bipolar, Depressed    Axis II: No diagnosis    Bynum Bellows, LCSW 01/30/2022

## 2022-02-13 ENCOUNTER — Other Ambulatory Visit (HOSPITAL_COMMUNITY): Payer: Self-pay | Admitting: Psychiatry

## 2022-02-22 ENCOUNTER — Telehealth (HOSPITAL_COMMUNITY): Payer: Self-pay | Admitting: Psychiatry

## 2022-02-22 NOTE — Telephone Encounter (Signed)
Left 2nd voicemail 02/22/2022 requesting call back to reschedule 02/26/2022 appointment.

## 2022-02-26 ENCOUNTER — Ambulatory Visit (HOSPITAL_COMMUNITY): Payer: Medicaid Other | Admitting: Psychiatry

## 2022-03-12 ENCOUNTER — Encounter (HOSPITAL_COMMUNITY): Payer: Self-pay | Admitting: Psychiatry

## 2022-03-12 ENCOUNTER — Ambulatory Visit (INDEPENDENT_AMBULATORY_CARE_PROVIDER_SITE_OTHER): Payer: Medicaid Other

## 2022-03-12 ENCOUNTER — Ambulatory Visit (INDEPENDENT_AMBULATORY_CARE_PROVIDER_SITE_OTHER): Payer: Medicaid Other | Admitting: Psychiatry

## 2022-03-12 VITALS — BP 100/64 | Temp 98.6°F | Ht 63.75 in | Wt 180.0 lb

## 2022-03-12 DIAGNOSIS — F4310 Post-traumatic stress disorder, unspecified: Secondary | ICD-10-CM | POA: Diagnosis not present

## 2022-03-12 DIAGNOSIS — F3170 Bipolar disorder, currently in remission, most recent episode unspecified: Secondary | ICD-10-CM

## 2022-03-12 DIAGNOSIS — F411 Generalized anxiety disorder: Secondary | ICD-10-CM

## 2022-03-12 DIAGNOSIS — I255 Ischemic cardiomyopathy: Secondary | ICD-10-CM

## 2022-03-12 MED ORDER — FLUOXETINE HCL 10 MG PO CAPS: 10.0000 mg | ORAL_CAPSULE | Freq: Every day | ORAL | 1 refills | Status: DC

## 2022-03-12 MED ORDER — GABAPENTIN 100 MG PO CAPS: 100.0000 mg | ORAL_CAPSULE | Freq: Three times a day (TID) | ORAL | 1 refills | Status: DC
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
## 2022-03-14 LAB — CUP PACEART REMOTE DEVICE CHECK
Battery Remaining Longevity: 48 mo
Battery Remaining Percentage: 51 %
Brady Statistic RV Percent Paced: 1 %
Date Time Interrogation Session: 20231024042200
HighPow Impedance: 102 Ohm
Implantable Lead Connection Status: 753985
Implantable Lead Implant Date: 20150514
Implantable Lead Location: 753860
Implantable Lead Model: 292
Implantable Lead Serial Number: 341088
Implantable Pulse Generator Implant Date: 20150514
Lead Channel Impedance Value: 1739 Ohm
Lead Channel Pacing Threshold Amplitude: 5.5 V
Lead Channel Pacing Threshold Pulse Width: 0.8 ms
Lead Channel Setting Pacing Amplitude: 5.5 V
Lead Channel Setting Pacing Pulse Width: 0.8 ms
Lead Channel Setting Sensing Sensitivity: 0.6 mV
Pulse Gen Serial Number: 115386
Zone Setting Status: 755011

## 2022-03-18 ENCOUNTER — Ambulatory Visit (HOSPITAL_COMMUNITY): Payer: Medicaid Other | Admitting: Licensed Clinical Social Worker

## 2022-04-01 NOTE — Progress Notes (Signed)
Remote ICD transmission.   

## 2022-05-07 ENCOUNTER — Telehealth: Payer: Self-pay | Admitting: Internal Medicine

## 2022-05-07 NOTE — Telephone Encounter (Signed)
Pt is requesting to speak to Dr. Graciela Husbands only. Please advise.

## 2022-05-07 NOTE — Telephone Encounter (Signed)
Attempted phone call to pt.  Unable to leave voicemail message as VM has not been set up.

## 2022-05-09 ENCOUNTER — Other Ambulatory Visit (HOSPITAL_COMMUNITY): Payer: Self-pay | Admitting: Psychiatry

## 2022-05-16 ENCOUNTER — Telehealth (HOSPITAL_COMMUNITY): Payer: Self-pay | Admitting: Licensed Clinical Social Worker

## 2022-05-16 NOTE — Telephone Encounter (Signed)
Patient walked into clinic requesting to speak with therapist. Informed that therapist is out of office until 05/21/2022. Requested therapist to call upon return to [phone] (private number). Stated she did not have an urgent issue that would require assistance more immediately. Scheduled appointment with therapist for 06/19/2022.

## 2022-05-22 NOTE — Telephone Encounter (Signed)
Patient requests Dr. Caryl Comes call her back directly.

## 2022-05-23 ENCOUNTER — Ambulatory Visit (INDEPENDENT_AMBULATORY_CARE_PROVIDER_SITE_OTHER): Payer: Medicaid Other | Admitting: Licensed Clinical Social Worker

## 2022-05-23 DIAGNOSIS — F3170 Bipolar disorder, currently in remission, most recent episode unspecified: Secondary | ICD-10-CM | POA: Diagnosis not present

## 2022-05-23 DIAGNOSIS — F411 Generalized anxiety disorder: Secondary | ICD-10-CM

## 2022-05-24 NOTE — Progress Notes (Signed)
   THERAPIST PROGRESS NOTE  Session Time: 1:00 pm-1:45 pm  Type of Therapy: Individual Therapy  Session #7  Purpose of Session: Alayziah will manage mood and anxiety as evidenced by expressing emotions appropriately, setting appropriate boundaries, and cope with daily stressors for 5 out of 7 days for 60 days.  Interventions: Therapist utilized CBT and Solution focused brief therapy to address mood. Therapist provided support and empathy to patient during session. Therapist processed patient's feelings to identify triggers for mood and PTSD.   Effectiveness: Patient was oriented x4 (person, place, situation, and time). Patient was casually dressed, and appropriately groomed. Patient was alert, engaged, stressed/anxious, and cooperative. Patient shared that the place she is living is evicting her. She was accused of smoking marijuana but has been vaping CBD to cope with her anxiety. She had to resign her lease agreement to correct the date and was given a piece of paper that stated she was admitting to using marijuana. She said she wouldn't sign it but was told if she didn't she would be evicted so she signed it. She was reminded for her trauma of being homeless and in that moment did what she needed to keep a place to live. The form was used against her as admitting to criminal activity. CBD is legal and not a criminal activity but she was forced to admit to using marijuana when she doesn't. Patient has got a Chief Executive Officer to challenge this. She is stress but is also coping with the situation. She has had no desire to drink or do anything unhealthy.   Patient engaged in session. Patient responded well to interventions. Patient continues to meet criteria for Bipolar affective disorder, currently depressed, mild. Patient will continue in outpatient therapy due to being the least restrictive service to meet her needs. Patient made moderate progress on her goals.   Suicidal/Homicidal: Nowithout  intent/plan  Plan: Return again in 2-4 weeks.   Diagnosis: Axis I: Bipolar, Depressed    Axis II: No diagnosis    Glori Bickers, LCSW 05/24/2022

## 2022-05-30 NOTE — Telephone Encounter (Signed)
Attempted phone call to pt.  Unable to leave voicemail as voicemail has not been set up. 

## 2022-06-03 NOTE — Telephone Encounter (Signed)
Spoke with pt who states she lives in an apartment community and has had a complaint from one of the other tenants regarding the "herbal cigarettes she has been smoking."  She states her lease states she may only smoke tobacco cigarettes and she may have to go to court over this to keep from being evicted.  She states Dr Caryl Comes is aware of her smoking the herbal cigs.  She states she may need a letter from him if she does end up going to court.  Pt states she has returned to smoking tobacco cigs at this time.   Pt advised Dr Caryl Comes is currently out of the office until next week but will forward to make him aware.  Pt verbalizes understanding and thanked Therapist, sports for the callback.

## 2022-06-06 NOTE — Telephone Encounter (Signed)
I am not sure that I can help her with her smoking of herbal cigarettes Sorry  Thanks SK

## 2022-06-07 NOTE — Telephone Encounter (Signed)
Spoke with pt and advised per Dr Caryl Comes he is not able to assist with a letter to OK the use of herbal cigs.  Pt verbalizes understanding and thanked Therapist, sports for the call.

## 2022-06-11 ENCOUNTER — Ambulatory Visit: Payer: Medicaid Other | Attending: Internal Medicine

## 2022-06-11 DIAGNOSIS — I255 Ischemic cardiomyopathy: Secondary | ICD-10-CM

## 2022-06-11 LAB — CUP PACEART REMOTE DEVICE CHECK
Battery Remaining Longevity: 42 mo
Battery Remaining Percentage: 47 %
Brady Statistic RV Percent Paced: 2 %
Date Time Interrogation Session: 20240123042100
HighPow Impedance: 88 Ohm
Implantable Lead Connection Status: 753985
Implantable Lead Implant Date: 20150514
Implantable Lead Location: 753860
Implantable Lead Model: 292
Implantable Lead Serial Number: 341088
Implantable Pulse Generator Implant Date: 20150514
Lead Channel Impedance Value: 1688 Ohm
Lead Channel Pacing Threshold Amplitude: 5.5 V
Lead Channel Pacing Threshold Pulse Width: 0.8 ms
Lead Channel Setting Pacing Amplitude: 5.5 V
Lead Channel Setting Pacing Pulse Width: 0.8 ms
Lead Channel Setting Sensing Sensitivity: 0.6 mV
Pulse Gen Serial Number: 115386
Zone Setting Status: 755011

## 2022-06-16 ENCOUNTER — Other Ambulatory Visit: Payer: Self-pay | Admitting: Internal Medicine

## 2022-06-17 NOTE — Telephone Encounter (Signed)
Prescription refill request for Xarelto received.  Indication:afib Last office visit:4/23 Weight:81.6  kg Age:47 Scr:0.8 CrCl:114.4  ml/min  Prescription refilled

## 2022-06-19 ENCOUNTER — Ambulatory Visit (HOSPITAL_COMMUNITY): Payer: Medicaid Other | Admitting: Licensed Clinical Social Worker

## 2022-07-02 ENCOUNTER — Ambulatory Visit (INDEPENDENT_AMBULATORY_CARE_PROVIDER_SITE_OTHER): Payer: Medicaid Other | Admitting: Psychiatry

## 2022-07-02 ENCOUNTER — Encounter (HOSPITAL_COMMUNITY): Payer: Self-pay | Admitting: Psychiatry

## 2022-07-02 VITALS — BP 112/68 | HR 70 | Ht 63.0 in | Wt 176.0 lb

## 2022-07-02 DIAGNOSIS — F411 Generalized anxiety disorder: Secondary | ICD-10-CM | POA: Diagnosis not present

## 2022-07-02 DIAGNOSIS — F4310 Post-traumatic stress disorder, unspecified: Secondary | ICD-10-CM

## 2022-07-02 DIAGNOSIS — F3131 Bipolar disorder, current episode depressed, mild: Secondary | ICD-10-CM | POA: Diagnosis not present

## 2022-07-02 MED ORDER — FLUOXETINE HCL 10 MG PO CAPS: 20.0000 mg | ORAL_CAPSULE | Freq: Every day | ORAL | 0 refills | Status: DC

## 2022-07-02 MED ORDER — GABAPENTIN 300 MG PO CAPS: 300.0000 mg | ORAL_CAPSULE | Freq: Two times a day (BID) | ORAL | 1 refills | Status: DC
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
## 2022-07-09 NOTE — Progress Notes (Signed)
Remote ICD transmission.   

## 2022-07-22 ENCOUNTER — Other Ambulatory Visit (HOSPITAL_COMMUNITY): Payer: Self-pay | Admitting: Psychiatry

## 2022-07-25 ENCOUNTER — Telehealth (HOSPITAL_COMMUNITY): Payer: Self-pay | Admitting: Licensed Clinical Social Worker

## 2022-07-25 ENCOUNTER — Ambulatory Visit (HOSPITAL_COMMUNITY): Payer: Medicaid Other | Admitting: Licensed Clinical Social Worker

## 2022-07-25 NOTE — Telephone Encounter (Signed)
Patient called at 1450 stating she "had gotten confused and thought her appointment today was at 3 o'clock  then realized it was scheduled for 2 o'clock." Requested to speak with provider by phone. [phone].

## 2022-08-01 ENCOUNTER — Ambulatory Visit (HOSPITAL_COMMUNITY): Payer: Medicaid Other | Admitting: Psychiatry

## 2022-08-08 ENCOUNTER — Ambulatory Visit (HOSPITAL_COMMUNITY): Payer: Medicaid Other | Admitting: Licensed Clinical Social Worker

## 2022-08-09 ENCOUNTER — Encounter (HOSPITAL_COMMUNITY): Payer: Self-pay

## 2022-08-09 ENCOUNTER — Telehealth (HOSPITAL_COMMUNITY): Payer: Medicaid Other | Admitting: Psychiatry

## 2022-08-13 ENCOUNTER — Encounter (HOSPITAL_COMMUNITY): Payer: Self-pay | Admitting: Psychiatry

## 2022-09-04 ENCOUNTER — Ambulatory Visit (INDEPENDENT_AMBULATORY_CARE_PROVIDER_SITE_OTHER): Payer: Medicaid Other | Admitting: Licensed Clinical Social Worker

## 2022-09-04 DIAGNOSIS — F411 Generalized anxiety disorder: Secondary | ICD-10-CM | POA: Diagnosis not present

## 2022-09-04 DIAGNOSIS — F3131 Bipolar disorder, current episode depressed, mild: Secondary | ICD-10-CM | POA: Diagnosis not present

## 2022-09-04 NOTE — Progress Notes (Signed)
   THERAPIST PROGRESS NOTE  Session Time: 11:00 am-11:45 am  Type of Therapy: Individual Therapy  Session #8  Purpose of Session: [place] will manage mood and anxiety as evidenced by expressing emotions appropriately, setting appropriate boundaries, and cope with daily stressors for 5 out of 7 days for 60 days.  Interventions: Therapist utilized CBT and Solution focused brief therapy to address mood. Therapist provided support and empathy to patient during session. Therapist administered the PHQ9 and GAD7 to patient during session. Therapist processed patient's feelings about court related to her eviction. Therapist worked with patient to identify steps to improve sleep to also improve mood.   Effectiveness: Patient was oriented x4 (person, place, situation, and time). Patient was casually dressed, and appropriately groomed. Patient was alert, engaged, stressed/anxious, and cooperative. Patient completed a PHQ9 with a score of 22 indicating severe depressive thoughts. Patient completed a GAD7 with a score of 20 indicating severe anxiety. Patient is feeling overwhelmed. She went to court and the eviction was dropped. Then the following day she had an officer show up to her home to give her another set of eviction notices due to alleged complaints. She was stressed due to walking out of her apartment to go to her psychiatry appointment but officers were serving her papers. Patient has not been sleeping well. Patient understood the risks of not sleeping and it's impacts on physical health as well as mental health. Patient also asked for a letter for her dog in her apartment for emotional support. She is going to continue to fight the eviction, and use the dog to help support her during this time. Patient is going to focus on sleep including staying comfortable and writing in her journal to reduce her thoughts at night.   Patient engaged in session. Patient responded well to interventions. Patient  continues to meet criteria for Bipolar affective disorder, currently depressed, mild. Patient will continue in outpatient therapy due to being the least restrictive service to meet her needs. Patient made moderate progress on her goals.   Suicidal/Homicidal: Nowithout intent/plan  Plan: Return again in 2-4 weeks.   Diagnosis: Axis I: Bipolar, Depressed    Axis II: No diagnosis    Bynum Bellows, LCSW 09/04/2022

## 2022-09-06 ENCOUNTER — Other Ambulatory Visit: Payer: Self-pay | Admitting: Internal Medicine

## 2022-09-10 ENCOUNTER — Ambulatory Visit (INDEPENDENT_AMBULATORY_CARE_PROVIDER_SITE_OTHER): Payer: Medicaid Other

## 2022-09-10 DIAGNOSIS — I255 Ischemic cardiomyopathy: Secondary | ICD-10-CM

## 2022-09-11 LAB — CUP PACEART REMOTE DEVICE CHECK
Battery Remaining Longevity: 42 mo
Battery Remaining Percentage: 44 %
Brady Statistic RV Percent Paced: 2 %
Date Time Interrogation Session: 20240423053800
HighPow Impedance: 94 Ohm
Implantable Lead Connection Status: 753985
Implantable Lead Implant Date: 20150514
Implantable Lead Location: 753860
Implantable Lead Model: 292
Implantable Lead Serial Number: 341088
Implantable Pulse Generator Implant Date: 20150514
Lead Channel Impedance Value: 1929 Ohm
Lead Channel Pacing Threshold Amplitude: 5.5 V
Lead Channel Pacing Threshold Pulse Width: 0.8 ms
Lead Channel Setting Pacing Amplitude: 5.5 V
Lead Channel Setting Pacing Pulse Width: 0.8 ms
Lead Channel Setting Sensing Sensitivity: 0.6 mV
Pulse Gen Serial Number: 115386
Zone Setting Status: 755011

## 2022-09-26 ENCOUNTER — Other Ambulatory Visit (HOSPITAL_COMMUNITY): Payer: Self-pay | Admitting: Psychiatry

## 2022-09-30 ENCOUNTER — Telehealth: Payer: Self-pay

## 2022-09-30 NOTE — Telephone Encounter (Signed)
Patient is overdue with Dr. Graciela Husbands. Needs apt.   Following alert received from [employer] received for Device alert for Right ventricular pacing lead impedance out of range. Currently 1960 ohms, subtle increase per trends - route to triage  RV pacing 2%.

## 2022-10-03 ENCOUNTER — Ambulatory Visit (HOSPITAL_COMMUNITY): Payer: Medicaid Other | Admitting: Licensed Clinical Social Worker

## 2022-10-09 NOTE — Progress Notes (Signed)
Remote ICD transmission.   

## 2022-10-23 ENCOUNTER — Ambulatory Visit (INDEPENDENT_AMBULATORY_CARE_PROVIDER_SITE_OTHER): Payer: Medicaid Other | Admitting: Licensed Clinical Social Worker

## 2022-10-23 DIAGNOSIS — F3131 Bipolar disorder, current episode depressed, mild: Secondary | ICD-10-CM

## 2022-10-23 DIAGNOSIS — F411 Generalized anxiety disorder: Secondary | ICD-10-CM | POA: Diagnosis not present
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
## 2022-10-28 ENCOUNTER — Other Ambulatory Visit: Payer: Self-pay | Admitting: Internal Medicine

## 2022-10-28 ENCOUNTER — Other Ambulatory Visit (HOSPITAL_COMMUNITY): Payer: Self-pay | Admitting: Psychiatry

## 2022-11-13 ENCOUNTER — Ambulatory Visit: Payer: Medicaid Other | Attending: Internal Medicine | Admitting: Internal Medicine

## 2022-11-13 ENCOUNTER — Encounter: Payer: Self-pay | Admitting: Internal Medicine

## 2022-11-13 VITALS — BP 110/70 | HR 63 | Ht 63.75 in | Wt 163.4 lb

## 2022-11-13 DIAGNOSIS — E039 Hypothyroidism, unspecified: Secondary | ICD-10-CM

## 2022-11-13 DIAGNOSIS — Z79899 Other long term (current) drug therapy: Secondary | ICD-10-CM | POA: Diagnosis not present

## 2022-11-13 DIAGNOSIS — I480 Paroxysmal atrial fibrillation: Secondary | ICD-10-CM | POA: Diagnosis not present

## 2022-11-13 DIAGNOSIS — I428 Other cardiomyopathies: Secondary | ICD-10-CM | POA: Diagnosis not present

## 2022-11-13 DIAGNOSIS — Z95810 Presence of automatic (implantable) cardiac defibrillator: Secondary | ICD-10-CM

## 2022-11-13 DIAGNOSIS — I472 Ventricular tachycardia, unspecified: Secondary | ICD-10-CM | POA: Diagnosis not present

## 2022-11-13 DIAGNOSIS — I493 Ventricular premature depolarization: Secondary | ICD-10-CM | POA: Diagnosis not present

## 2022-11-13 DIAGNOSIS — I255 Ischemic cardiomyopathy: Secondary | ICD-10-CM

## 2022-11-13 NOTE — Patient Instructions (Signed)
Medication Instructions:  Your physician recommends that you continue on your current medications as directed. Please refer to the Current Medication list given to you today.  *If you need a refill on your cardiac medications before your next appointment, please call your pharmacy*   Lab Work: CBC,TSH, Liver Panel and Iron labs If you have labs (blood work) drawn today and your tests are completely normal, you will receive your results only by: MyChart Message (if you have MyChart) OR A paper copy in the mail If you have any lab test that is abnormal or we need to change your treatment, we will call you to review the results.   Testing/Procedures: None ordered.    Follow-Up: At [facility], you and your health needs are our priority.  As part of our continuing mission to provide you with exceptional heart care, we have created designated Provider Care Teams.  These Care Teams include your primary Cardiologist (physician) and Advanced Practice Providers (APPs -  Physician Assistants and Nurse Practitioners) who all work together to provide you with the care you need, when you need it.  We recommend signing up for the patient portal called "MyChart".  Sign up information is provided on this After Visit Summary.  MyChart is used to connect with patients for Virtual Visits (Telemedicine).  Patients are able to view lab/test results, encounter notes, upcoming appointments, etc.  Non-urgent messages can be sent to your provider as well.   To learn more about what you can do with MyChart, go to [email or web address].    Your next appointment:   12 months with Dr Graciela Husbands
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
## 2022-11-14 LAB — HEPATIC FUNCTION PANEL
ALT: 15 IU/L (ref 0–32)
AST: 15 IU/L (ref 0–40)
Albumin: 4.6 g/dL (ref 3.9–4.9)
Alkaline Phosphatase: 101 IU/L (ref 44–121)
Bilirubin Total: 0.4 mg/dL (ref 0.0–1.2)
Bilirubin, Direct: 0.1 mg/dL (ref 0.00–0.40)
Total Protein: 6.7 g/dL (ref 6.0–8.5)

## 2022-11-14 LAB — TSH: TSH: 0.786 u[IU]/mL (ref 0.450–4.500)

## 2022-11-14 LAB — CBC
Hematocrit: 42.6 % (ref 34.0–46.6)
Hemoglobin: 13.9 g/dL (ref 11.1–15.9)
MCH: 30.7 pg (ref 26.6–33.0)
MCHC: 32.6 g/dL (ref 31.5–35.7)
MCV: 94 fL (ref 79–97)
Platelets: 261 10*3/uL (ref 150–450)
RBC: 4.53 x10E6/uL (ref 3.77–5.28)
RDW: 12.7 % (ref 11.7–15.4)
WBC: 12.3 10*3/uL — ABNORMAL HIGH (ref 3.4–10.8)

## 2022-11-14 LAB — IRON,TIBC AND FERRITIN PANEL
Ferritin: 24 ng/mL (ref 15–150)
Iron Saturation: 40 % (ref 15–55)
Iron: 141 ug/dL (ref 27–159)
Total Iron Binding Capacity: 356 ug/dL (ref 250–450)
UIBC: 215 ug/dL (ref 131–425)

## 2022-12-10 ENCOUNTER — Ambulatory Visit: Payer: Medicaid Other

## 2022-12-24 ENCOUNTER — Other Ambulatory Visit: Payer: Self-pay | Admitting: Internal Medicine

## 2023-01-28 DIAGNOSIS — R112 Nausea with vomiting, unspecified: Secondary | ICD-10-CM | POA: Diagnosis not present

## 2023-02-03 ENCOUNTER — Ambulatory Visit: Payer: Medicaid Other | Attending: Internal Medicine | Admitting: Internal Medicine

## 2023-02-04 ENCOUNTER — Encounter: Payer: Self-pay | Admitting: Internal Medicine

## 2023-02-12 ENCOUNTER — Other Ambulatory Visit: Payer: Self-pay | Admitting: Internal Medicine

## 2023-02-18 ENCOUNTER — Encounter: Payer: Medicaid Other | Admitting: Internal Medicine

## 2023-03-04 ENCOUNTER — Other Ambulatory Visit: Payer: Self-pay | Admitting: Internal Medicine

## 2023-03-11 ENCOUNTER — Ambulatory Visit: Payer: Medicaid Other

## 2023-06-10 ENCOUNTER — Ambulatory Visit (INDEPENDENT_AMBULATORY_CARE_PROVIDER_SITE_OTHER): Payer: Medicaid Other

## 2023-06-10 DIAGNOSIS — I428 Other cardiomyopathies: Secondary | ICD-10-CM

## 2023-06-10 LAB — CUP PACEART REMOTE DEVICE CHECK
Battery Remaining Longevity: 30 mo
Battery Remaining Percentage: 34 %
Brady Statistic RV Percent Paced: 1 %
Date Time Interrogation Session: 20250121042200
HighPow Impedance: 119 Ohm
Implantable Lead Connection Status: 753985
Implantable Lead Implant Date: 20150514
Implantable Lead Location: 753860
Implantable Lead Model: 292
Implantable Lead Serial Number: 341088
Implantable Pulse Generator Implant Date: 20150514
Lead Channel Impedance Value: 1955 Ohm
Lead Channel Pacing Threshold Amplitude: 5 V
Lead Channel Pacing Threshold Pulse Width: 0.8 ms
Lead Channel Setting Pacing Amplitude: 5.5 V
Lead Channel Setting Pacing Pulse Width: 0.8 ms
Lead Channel Setting Sensing Sensitivity: 0.6 mV
Pulse Gen Serial Number: 115386
Zone Setting Status: 755011

## 2023-06-17 ENCOUNTER — Telehealth (HOSPITAL_COMMUNITY): Payer: Self-pay

## 2023-06-17 NOTE — Telephone Encounter (Signed)
Pt called in stating that she was told from the [place] office to reach out to [place] to see about getting scheduled with our provider for med management. Per [facility] provider has to review to see if they will be able to take her on. Please review

## 2023-06-17 NOTE — Telephone Encounter (Signed)
Called pt no answer vm not set up

## 2023-06-18 NOTE — Telephone Encounter (Signed)
Scheduled

## 2023-06-25 ENCOUNTER — Other Ambulatory Visit: Payer: Self-pay | Admitting: Internal Medicine

## 2023-06-25 DIAGNOSIS — I480 Paroxysmal atrial fibrillation: Secondary | ICD-10-CM

## 2023-06-25 NOTE — Telephone Encounter (Signed)
 Prescription refill request for Xarelto  received.  Indication: Afib  Last office visit: 11/13/22 [place])  Weight: 74.1kg Age: 48 Scr: 0.84 (10/05/21)  CrCl: 97.52ml/min  Labs overdue. Pt has scheduled appt with Glendia Ferrier, PA on 07/09/23. Note placed on upcoming appt to draw CBC and BMET. Refill sent to prevent missed doses.

## 2023-06-26 ENCOUNTER — Ambulatory Visit (HOSPITAL_COMMUNITY): Payer: Medicaid Other | Admitting: Registered Nurse

## 2023-07-01 ENCOUNTER — Telehealth (HOSPITAL_COMMUNITY): Payer: Self-pay

## 2023-07-01 NOTE — Telephone Encounter (Signed)
07/03/23 appt confirmed by pt

## 2023-07-03 ENCOUNTER — Ambulatory Visit (HOSPITAL_COMMUNITY): Payer: Medicaid Other | Admitting: Registered Nurse

## 2023-07-03 ENCOUNTER — Encounter (HOSPITAL_COMMUNITY): Payer: Self-pay | Admitting: Registered Nurse

## 2023-07-03 DIAGNOSIS — F4310 Post-traumatic stress disorder, unspecified: Secondary | ICD-10-CM | POA: Diagnosis not present

## 2023-07-03 DIAGNOSIS — F411 Generalized anxiety disorder: Secondary | ICD-10-CM | POA: Diagnosis not present

## 2023-07-03 DIAGNOSIS — F331 Major depressive disorder, recurrent, moderate: Secondary | ICD-10-CM | POA: Diagnosis not present

## 2023-07-03 DIAGNOSIS — G4700 Insomnia, unspecified: Secondary | ICD-10-CM

## 2023-07-03 MED ORDER — SERTRALINE HCL 25 MG PO TABS: 25.0000 mg | ORAL_TABLET | Freq: Every day | ORAL | 0 refills | Status: DC

## 2023-07-03 MED ORDER — TRAZODONE HCL 50 MG PO TABS: 50.0000 mg | ORAL_TABLET | Freq: Every evening | ORAL | 0 refills | Status: DC | PRN

## 2023-07-03 MED ORDER — GABAPENTIN 100 MG PO CAPS: 100.0000 mg | ORAL_CAPSULE | Freq: Three times a day (TID) | ORAL | 0 refills | Status: DC

## 2023-07-03 NOTE — Patient Instructions (Addendum)
Labs have been ordered since it has been a while since you had any.  Labs are checked at least every 6 months or yearly.  Routine bloodwork is an important part of assessing a patient's overall health. Tests such as a complete blood count, metabolic panel, lipid panel, thyroid function tests, and hemoglobin A1c test (screening for diabetes) can help a psychiatrist understand the general medical health of a patient.  Bloodwork can help make informed decisions about a potential differential diagnosis and help understand other factors that may be the correct reason for the presentation.  In some cases, bloodwork can aid in the diagnosis or treatment of a mental disorder  Call 911, mobile crisis, or present to the nearest emergency room should you experience any suicidal/homicidal ideation, auditory/visual/hallucinations, or detrimental worsening of your mental health.  Mobile Crisis Response Teams Listed by counties in vicinity of [facility] providers [facility] [phone] [facility] [phone] [facility] [phone] [facility] [phone] [place]                * [facility] [phone]                * [facility] [phone]  [facility] [phone] [facility] [employer]  [phone] * [employer] [phone]
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
## 2023-07-09 ENCOUNTER — Ambulatory Visit: Payer: Medicaid Other | Admitting: Physician Assistant

## 2023-07-16 ENCOUNTER — Encounter: Payer: Self-pay | Admitting: Internal Medicine

## 2023-07-17 ENCOUNTER — Telehealth (HOSPITAL_COMMUNITY): Payer: Medicaid Other | Admitting: Registered Nurse

## 2023-07-17 ENCOUNTER — Encounter (HOSPITAL_COMMUNITY): Payer: Self-pay | Admitting: Registered Nurse

## 2023-07-17 DIAGNOSIS — F331 Major depressive disorder, recurrent, moderate: Secondary | ICD-10-CM | POA: Diagnosis not present

## 2023-07-17 DIAGNOSIS — F411 Generalized anxiety disorder: Secondary | ICD-10-CM

## 2023-07-17 DIAGNOSIS — F4310 Post-traumatic stress disorder, unspecified: Secondary | ICD-10-CM

## 2023-07-17 MED ORDER — GABAPENTIN 100 MG PO CAPS: 100.0000 mg | ORAL_CAPSULE | Freq: Three times a day (TID) | ORAL | 1 refills | Status: DC

## 2023-07-17 MED ORDER — SERTRALINE HCL 50 MG PO TABS: 50.0000 mg | ORAL_TABLET | Freq: Every day | ORAL | 1 refills | Status: DC
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
## 2023-07-17 NOTE — Patient Instructions (Addendum)
 Call 911, mobile crisis, or present to the nearest emergency room should you experience any suicidal/homicidal ideation, auditory/visual/hallucinations, or detrimental worsening of your mental health.  Mobile Crisis Response Teams Listed by counties in vicinity of [facility] providers [facility] [phone] [facility] [phone] [facility] [phone] [facility] [phone] [place]                * [facility] [phone]                * [facility] [phone]  [facility] [phone] [facility]  [phone] * [facility] [phone]      Here are a List of outpatient providers that accept Medicaid and offer virtual and some in person counseling:   Virtual Services Website:  [email or web address]   Get better, faster with quality mental health care Our providers take a hands-on approach to help you see improvement at every step, no matter how severe your symptoms. Just come as you are and let us take it from there.  Appointments in as little as 2 days Receive quick guidance and support by speaking with a licensed professional in a matter of hours.  Care for even the most severe cases We offer care for mild to severe depression, anxiety, and more --including Crisis Care for adults with elevated suicide risk.  Personalized plans unique to you Our treatment plans are tailored to your specific needs, providing you a clear path to success.  1:1 support from start to finish Get paired with a dedicated provider to serve as your single point of contact throughout treatment.  Results-based care, built for you Your expert provider will ensure your plan is grounded in data, lending support from beginning to end.  Choose psychiatry, therapy, or both.  Psychiatry When medication is necessary, our psychiatric providers get it right fast --analyzing 100+ data points to determine which treatment is likely to be most tolerable and effective for you.  Therapy Our virtual program combines cognitive and behavioral therapy with independent skill practice--all of which have been clinically proven to work for a wide range of symptoms so you can get better, and stay better.  Crisis Care A first-of-its-kind program for individuals with elevated suicide risk, Crisis Care is based on the Collaborative Assessment and Management of Suicidality (CAMS) framework--a care model that's backed by 30 years of research.  Teen Care (new) Give your teen a safe space to connect and get personal support. Our expert therapists help teens 13 and up with many common concerns--from school and peer pressure to anxiety and friendships. Psychiatry services available, if appropriate.  Virtual, dedicated support every step of the way 1:1 Video Sessions Let your provider know how you're feeling, get to know you, and provide 1:1 support.  Anytime Messaging Get questions or concerns off your chest between video visits by messaging your provider at any time.  Interactive Lessons Learn how to integrate new thought and behavior patterns into your daily life.  Proactive Progress Tracking Complete weekly check-ins so your provider can track your progress and, if necessary, adjust your treatment and/or medication.  1:1 Video Sessions Let your provider know how you're feeling, get to know you, and provide 1:1 support.    Sheria Lang Lic Clinical Mental Health Counselor Associate, MA, [facility], CCTP Available both in-person and online [address] [place], [place] [address]  Phone:  [phone]  Specialties  and Soil scientist Anxiety Depression Coping Skills Expertise ADHD Behavioral Issues Oppositional  [place] (ODD) School Issues Self Esteem Stress Trauma and PTSD  How are you doing? Not what you tell others, Be honest with yourself How are you really doing?Marland Kitchen. Let's talk about what's really on your mind. I specialize in supporting individuals, children, and families as they navigate life's challenges and work toward healing. My background includes experience in crisis support as a first responder, which has deepened my understanding of how acute and long-term stress impacts mental health. I use a person-centered approach, ensuring that your unique strengths, experiences, and goals guide our work together. My therapeutic style is eclectic, drawing from evidence-based modalities such as Cognitive Behavioral Therapy (CBT), Trauma-Focused Therapy, mindfulness practices, and somatic techniques to create a personalized path toward wellness.   Susa Simmonds Licensed Clinical Mental Health Counselor Associate, LCMHC-A, [facility] Available both in-person and online Location:  [place] - [address], [place], [place], [address] Phone: [phone] Website:  [email or web address] Hello! I'm Susa Simmonds, LCMHC-A, NCC. I earned my Master's in Clinical Mental Health Counseling and a Certificate in Marriage and Family Counseling from [employer] [facility] [employer]. My experience includes roles as a Science writer for individuals on the Autism Spectrum. I gained most of my clinical experience at a children's center, collaborating with a multidisciplinary team to provide top-notch care. I'm passionate about working with children, adolescents, young adults, and families facing anxiety, depression, trauma, self-esteem issues, and relational challenges. I use play therapy and expressive art techniques when traditional talk therapy isn't effective. My approaches include cognitive-behavioral, solution-focused,  trauma-focused therapies, and MATCH-ADTC (Modular Approach to Therapy for Children with Anxiety, Depression, Trauma, and Conduct problems). As your therapist, I'll tailor support to your needs and offer at-home activities to help you achieve your goals. My focus is on fostering self-awareness, growth, and confidence in a supportive environment of hope and empowerment. Outside work, I enjoy reading, trying new foods, and spending time with loved ones. Conditions Treated ADHD Anxiety/Phobias/Panic attacks Autism Bipolar disorder Childhood behavioral issues Couple's issues Depression Family Focus and concentration LGBTQ+ Obsessions or compulsions Postpartum or Peripartum Issues PTSD or Trauma Stress management   Nira Conn Counselor, [facility], LCASA, NCC (she, her) Available online only Location:  [place], [place] [address] Phone:  [phone] My clients can expect an environment free of judgment, full of understanding and empathy, where they can face the challenges that they have in a supportive environment. My personal background allows me to look at client issues from a different view and offer real world healing that meets the client where they are. My client's come from various backgrounds, but all seeking to better understand themselves and a better way of coping. Because I'm seeing clients virtually, they feel freer to share more intimate and personal concerns that might be harder to talk about in person. Top Specialties LGBTQ+ Sex Therapy Expertise Addiction ADHD Anxiety Behavioral Issues Bipolar Disorder Body Positivity Borderline Personality (BPD) Cancer Child Chronic Pain Coping Skills Depression Life Transitions Marital and Premarital Obesity Open Relationships Non-Monogamy Parenting Peer Relationships Relationship Issues Self Esteem Sex-Positive, Kink Allied Sexual Abuse Sexual Addiction Stress Trauma and PTSD Weight Loss   [employer] Professional, MDiv, Med Available online only [address] [place], [place] [address] Phone:  [phone] Website:  [email or web address] Welcome! My name is Sara Lee Scales, and I am passionate about helping individuals navigate life's challenges, develop resilience, and  unlock their full potential. With over a decade of experience in counseling, ministry, and coaching, I provide a holistic approach to personal and spiritual development, drawing on my diverse professional background and extensive training. Administrator Spirituality Education and Learning Disabilities Mood Disorders Expertise ADHD Anxiety Pension scheme manager Counseling Child Coping Skills Depression Family Conflict First Responders Grief Intellectual Disability Life Coaching Life Transitions Marital and Premarital Parenting Peer Relationships Relationship Issues School Issues Self Esteem Sexual Abuse Teen Violence Trauma and PTSD     Same-day appointments Operating hours and clinician availability may delay appointments until the next business day. [facility] and Current Patients  Psychiatry hours Monday-Friday, 8am-4:30pm (Eastern Time) If you are experiencing problems accessing [facility] send email to: [email or web address] or call [phone], Monday-Friday, 8:00am-4:30pm If you are having a psychiatric or medical emergency, please call 911 or go to the nearest emergency department. To reach the Suicide and Crisis Lifeline, please call or text [phone].  What is [facility]?  [facility] is an online service that provides same-day access to psychiatry to meet urgent mental health needs. The goal is to provide patients with timely intervention and keep them in an outpatient setting.  How long will I wait to see a clinician?  Our goal is to provide same-day care. However, operating hours and clinician  availability may delay appointments until the next business day.  What if I can't get an appointment?  Please call [facility] at [phone] 8am to 5pm [place] Time or email [place]:  [email or web address]  to request the next available time. If you are having a psychiatric or medical emergency, please call 911 or go to the nearest emergency department. To reach the Suicide and Crisis Lifeline, please call or text 988. Do you accept insurance?  Yes. We accept most commercial insurance plans. What information do you need from me? New patients should be ready to provide:  Photo ID  Insurance card  Payment information  For current patients, our specialists will confirm your documentation is on file.   Can I continue with regular care after my [facility] session?  Yes. Our goal is to make sure you have the follow-up care you need. Our specialist can help you schedule an appointment with an ongoing provider in addition to your On Demand session.  Can my current clinician provide treatment on [facility]?  No. Our [facility] clinicians are trained to assist with more immediate needs and provide support between regular appointments with your clinician.  What device can I use to connect with [facility]?  You can use any Wi-Fi-enabled device with a camera and a microphone, such as a smartphone, tablet, or computer.  Do I have to be at home to connect with [facility]?  No. As long as you are located within [place] or [place] you can connect with a provider. We do request that you connect from a safe and private location. Your clinician is required to document your location for emergency purposes.  Insurance:  Ulla Gallo, [place], [employer], [place], [place], [place], [place], [employer], [employer]   [employer] [phone] [address] [place], [place], [address] [phone] phone Completely online treatment  platform Contact: [place] [facility] Outreach Specialist [phone] phone [phone] fax    [address] Therapy  [address] [place], [place] [address]  Phone:  [phone] Fax: [phone]  [facility] Outpatient Program (  OP) An Outpatient Treatment Program, or OP, is a service for individuals seeking support for substance abuse or mental health concerns who do not require frequent or intense support or safety monitoring. This level is appropriate for people with less severe disorders, or as a step-down from more intensive services. At [facility], OP is available for individuals aged 28 and above. It consists of basic treatment services offered in individual and group format and totals to less than 9 hours a week. Both virtual and in-person options for attending are available. OP is conducted by treatment professionals licensed to serve individuals with mental health and substance use disorders within [place]. OP helps individuals address a broad range of psychological and interpersonal challenges including: Substance Related Disorders Behavioral Addictions Anxiety Depression Trauma and Stressor Related Issues PTSD Parenting and Family Issues Relationship Issues Stress Self-Esteem Bipolar Disorders Life Transitions Personality Disorders ADHD Grief and Loss  *[facility] is proud to offer specialized treatment services on an outpatient level including, EMDR, Marriage/Family Counseling and Christian Counseling  We currently accept all major insurance, including: Medicaid,Uninsured, [employer], [address], [place], [employer], [place], Optum Serve, [employer], [employer], [employer] Phone:  [phone] Website:  [email or web address]           How to get started with virtual therapy covered by Medicaid: Medicaid-covered members can  call our Admissions Team 24/7 or fill out our online form to learn about their plan's specific benefits and get started with treatment. Once we verify your Medicaid benefits, our Clinical Team will conduct a thorough mental health assessment to create your personalized treatment plan. Medicaid members can get started with their personalized treatment plan (which includes curated groups, individual therapy, and family therapy) in as little as 24 hours. Call [phone]  What we Treat:  Anxiety Treatment for Teens and Adults  Our therapists specialize in cognitive behavioral therapy, a leading anxiety treatment, to provide evidence-based mental healthcare for teens and adults dealing with anxiety disorders. Fill out the short form below or call us directly to start healing from anxiety today with [facility].  Depression Treatment for Teens and Adults Depression affects millions of people worldwide, but healing is possible with evidence-based treatment.   Trauma Treatment for Teens and Adults After surviving trauma, building connections and receiving trauma-informed care are critical for long-lasting healing. That's why [facility] offers trauma-informed therapy in individual and group sessions.   Self-Harm Treatment for Teens and Adults Self-harm is often linked to serious mental health issues, which is why understanding the root of self-harm is key to long-lasting recovery.   Suicidal ideation Passive suicidal ideation, chronic suicidal ideation, previous suicide attempt  Substance Use Disorders Treatment for Teens and Adults:   Alcohol, marijuana, prescription drugs, opioids, amphetamines, cocaine, inhalants, hallucinogens, nicotine Understanding the mental health roots of substance use disorders (SUD) is key to long-lasting recovery.

## 2023-07-23 NOTE — Progress Notes (Signed)
 Remote ICD transmission.

## 2023-07-30 ENCOUNTER — Other Ambulatory Visit (HOSPITAL_COMMUNITY): Payer: Self-pay | Admitting: Registered Nurse

## 2023-07-30 ENCOUNTER — Other Ambulatory Visit: Payer: Self-pay | Admitting: Internal Medicine

## 2023-07-30 ENCOUNTER — Telehealth (HOSPITAL_COMMUNITY): Payer: Self-pay | Admitting: *Deleted

## 2023-07-30 DIAGNOSIS — I480 Paroxysmal atrial fibrillation: Secondary | ICD-10-CM

## 2023-07-30 DIAGNOSIS — G4700 Insomnia, unspecified: Secondary | ICD-10-CM

## 2023-07-30 MED ORDER — TRAZODONE HCL 50 MG PO TABS: 50.0000 mg | ORAL_TABLET | Freq: Every day | ORAL | 0 refills | Status: DC

## 2023-07-30 NOTE — Telephone Encounter (Signed)
 Refill sent.

## 2023-07-30 NOTE — Telephone Encounter (Signed)
 Rx Refill Request Not Found on Current Medication List  [facility] [place], [place] - [address] [place] [address]   Trazodone 50 mg tablet Last Fill Date 07/03/23

## 2023-07-30 NOTE — Telephone Encounter (Signed)
 Patient called reporting she did not get prescription for Trazodone.  Trazodone was discontinued after she reported she was sleeping better but not related to taking Trazodone. "I stopped taking it after the 3 rd night. It wasn't really helping, and it made me feel tired and drained the next day.  Continued the gabapentin since she said it was also helping with anxiety and sleep.  Scheduled an appointment for one month follow up but didn't see.  I will send in the prescription for Trazodone 50 mg daily at bedtime prn.    Prescription sent for Trazodone 50 mg Q hs prn sleep.  30 tablets, no refills.    Jenasia Dolinar B. Riku Buttery, NP

## 2023-07-30 NOTE — Telephone Encounter (Signed)
 Prescription refill request for Xarelto received.  Indication:afib Last office visit:6/24 Weight:74.1  kg Age:48 UJW:JXBJY labs CrCl:needs labs  Prescription refilled

## 2023-07-31 DIAGNOSIS — E7800 Pure hypercholesterolemia, unspecified: Secondary | ICD-10-CM | POA: Insufficient documentation

## 2023-07-31 DIAGNOSIS — I2510 Atherosclerotic heart disease of native coronary artery without angina pectoris: Secondary | ICD-10-CM | POA: Insufficient documentation

## 2023-07-31 NOTE — Progress Notes (Signed)
 "    Cardiology Office Note:    Date:  08/01/2023  ID:  Diana Moreno, DOB August 16, 1975, MRN [id number] PCP: Orlando Pond, DO  [facility] Providers Cardiologist:  Vina Gull, MD Electrophysiologist:  Elspeth Sage, MD       Patient Profile:      (HFrEF) heart failure with reduced ejection fraction  Ischemic cardiomyopathy  GDMT limited by low BP EF has improved in the past but decreased again TTE 04/30/13: EF 25-30  TTE 07/29/14 EF 45 TTE 08/13/16: EF 40 TTE 10/30/18: EF 45-50  TTE 12/22/20: EF 35-40, global HK, Gr 2 DD, NL RVSF, mod LAE, mild MR, RAP 3 Hx of LV thrombus  Coronary artery disease  LHC 06/04/13: OM1 30, RCA 100 CTO w L-R collats Paroxysmal atrial fibrillation  S/p PVI ablation 11/2018 VT S/p ICD 09/2013 Peripheral arterial disease  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  History of Present Illness Diana Moreno is a 48 y.o. female who returns for follow up of CAD, CHF. She was last seen by Dr. Gull in 07/2021.   She is here alone.  She experiences increased anxiety and emotional distress around this time each year, which she attributes to 'stored up trauma' and triggers from memories. This time of year marks the anniversary of her aortic bypass surgery on March 17th, which she associates with heightened emotional and anxious states. She has a history of smoking and wants to quit. She previously quit for nine months.  She has contacted the quit line before and was promised a prescription for nicotine  patches, but never received these.  She has not had any chest discomfort, pain, pressure, or shortness of breath. She can walk up a flight of stairs without stopping and lays flat without breathing difficulty. She reports some leg swelling  after prolonged sitting but no passing out, bloody stool, black stool, or bloody urine. Her stool is dark due to iron supplementation.   Review of Systems  Gastrointestinal:  Negative for hematochezia and melena.  Genitourinary:  Negative for hematuria.  -See HPI    Studies Reviewed:       Results    Risk Assessment/Calculations:    CHA2DS2-VASc Score = 5   This indicates a 7.2% annual risk of stroke. The patient's score is based upon: CHF History: 1 HTN History: 0 Diabetes History: 0 Stroke History: 2 Vascular Disease History: 1 Age Score: 0 Gender Score: 1    HYPERTENSION CONTROL Vitals:   08/01/23 1028 08/01/23 1105  BP: (!) 146/90 (!) 138/92    The patient's blood pressure is elevated above target today.  In order to address the patient's elevated BP: Blood pressure will be monitored at home to determine if medication changes need to be made.          Physical Exam:   VS:  BP (!) 138/92   Pulse 80   Ht 5' 3 (1.6 m)   Wt 167 lb 12.8 oz (76.1 kg)   SpO2 97%   BMI 29.72 kg/m    Wt Readings from Last 3 Encounters:  08/01/23 167 lb 12.8 oz (76.1 kg)  11/13/22 163 lb 6.4 oz (74.1 kg)  09/04/21 166 lb 12.8 oz (75.7 kg)    Constitutional:  Appearance: Healthy appearance. Not in distress.  Neck:     Vascular: JVD normal.  Pulmonary:     Breath sounds: Normal breath sounds. No wheezing. No rales.  Cardiovascular:     Normal rate. Regular rhythm.     Murmurs: There is no murmur.  Edema:    Peripheral edema absent.  Abdominal:     Palpations: Abdomen is soft.        Assessment and Plan:   Assessment & Plan HFrEF (heart failure with reduced ejection fraction) (HCC) Ischemic cardiomyopathy.  EF 35-40 by echo in August 2022.  She is NYHA class II. Volume status is stable.  Previously, GDMT was limited due to hypotension.  Today, her blood pressure is elevated.  This may be related to increased anxiety.  She also smoked a cigarette prior to coming to  the office.  Discussed potential medication adjustments if hypertension persists, including switching metoprolol  to carvedilol  or changing lisinopril  to Entresto, with a 36-hour washout period if switching to Entresto. - Monitor BP over the next week and send readings - Continue lisinopril  2.5 mg daily - Continue metoprolol  succinate 25 mg twice daily - If hypertension persists, consider switching metoprolol  to carvedilol  or changing lisinopril  to Entresto, with a 36-hour washout period if switching to Entresto - Plan follow-up in 9 months unless medication adjustment is needed Paroxysmal atrial fibrillation (HCC) Status post ablation in July 2020. Currently maintaining sinus rhythm. Tolerating anticoagulation well.   - Continue Xarelto  20 mg daily - Continue metoprolol  succinate 25 mg twice daily - Continue amiodarone  100 mg daily - Obtain follow-up CMET, CBC and TSH today VT (ventricular tachycardia) (HCC) Status post ICD implantation in May 2015.  She is concerned about continuing amiodarone  due to side effects.  I asked her to discuss this with Dr. Fernande at her next visit.  It appears that she is on amiodarone  for both history of atrial fibrillation as well as ventricular tachycardia. - Continue follow-up with EP as planned - Continue amiodarone  100 mg daily for now Pure hypercholesterolemia Hyperlipidemia with a goal LDL of less than 55.   - Continue Crestor  5 mg daily - Obtain CMET, lipid panel today Coronary artery disease involving native coronary artery of native heart without angina pectoris Cardiac catheterization in January 2015 with chronically occluded RCA with left-to-right collaterals.  She is doing well without chest symptoms to suggest angina.  She is not on aspirin  as she is on Xarelto .   - Continue Crestor  5 mg daily, metoprolol  succinate 25 mg twice daily, lisinopril  2.5 mg daily. Tobacco use She would like to quit.  She would like to use nicotine  patches. - Rx given for  nicotine  patches.  Start with 21 mg and taper down over several weeks.      Dispo:  Return in about 9 months (around 05/02/2024) for Routine Follow Up, w/ Dr. Okey, or Glendia Ferrier, PA-C.  Signed, Glendia Ferrier, PA-C   "

## 2023-08-01 ENCOUNTER — Ambulatory Visit: Payer: Medicaid Other | Attending: Cardiology | Admitting: Physician Assistant

## 2023-08-01 ENCOUNTER — Encounter: Payer: Self-pay | Admitting: Physician Assistant

## 2023-08-01 VITALS — BP 138/92 | HR 80 | Ht 63.0 in | Wt 167.8 lb

## 2023-08-01 DIAGNOSIS — I472 Ventricular tachycardia, unspecified: Secondary | ICD-10-CM

## 2023-08-01 DIAGNOSIS — I2510 Atherosclerotic heart disease of native coronary artery without angina pectoris: Secondary | ICD-10-CM

## 2023-08-01 DIAGNOSIS — Z720 Tobacco use: Secondary | ICD-10-CM

## 2023-08-01 DIAGNOSIS — I5020 Unspecified systolic (congestive) heart failure: Secondary | ICD-10-CM

## 2023-08-01 DIAGNOSIS — I480 Paroxysmal atrial fibrillation: Secondary | ICD-10-CM

## 2023-08-01 DIAGNOSIS — E7800 Pure hypercholesterolemia, unspecified: Secondary | ICD-10-CM

## 2023-08-01 MED ORDER — METOPROLOL SUCCINATE ER 25 MG PO TB24: 25.0000 mg | ORAL_TABLET | Freq: Two times a day (BID) | ORAL | 3 refills | Status: AC

## 2023-08-01 MED ORDER — AMIODARONE HCL 200 MG PO TABS: 100.0000 mg | ORAL_TABLET | Freq: Every day | ORAL | 3 refills | Status: AC

## 2023-08-01 MED ORDER — ROSUVASTATIN CALCIUM 5 MG PO TABS: 5.0000 mg | ORAL_TABLET | Freq: Every day | ORAL | 3 refills | Status: AC

## 2023-08-01 MED ORDER — NICOTINE 14 MG/24HR TD PT24: MEDICATED_PATCH | TRANSDERMAL | 0 refills | Status: DC

## 2023-08-01 MED ORDER — NICOTINE 21 MG/24HR TD PT24: MEDICATED_PATCH | TRANSDERMAL | 0 refills | Status: DC

## 2023-08-01 MED ORDER — NICOTINE 7 MG/24HR TD PT24: MEDICATED_PATCH | TRANSDERMAL | 0 refills | Status: DC

## 2023-08-01 MED ORDER — RIVAROXABAN 20 MG PO TABS: 20.0000 mg | ORAL_TABLET | Freq: Every day | ORAL | 3 refills | Status: AC

## 2023-08-01 NOTE — Assessment & Plan Note (Signed)
 Cardiac catheterization in January 2015 with chronically occluded RCA with left-to-right collaterals.  She is doing well without chest symptoms to suggest angina.  She is not on aspirin as she is on Xarelto.   - Continue Crestor 5 mg daily, metoprolol succinate 25 mg twice daily, lisinopril 2.5 mg daily.

## 2023-08-01 NOTE — Assessment & Plan Note (Signed)
 Ischemic cardiomyopathy.  EF 35-40 by echo in August 2022.  She is NYHA class II. Volume status is stable.  Previously, GDMT was limited due to hypotension.  Today, her blood pressure is elevated.  This may be related to increased anxiety.  She also smoked a cigarette prior to coming to the office.  Discussed potential medication adjustments if hypertension persists, including switching metoprolol to carvedilol or changing lisinopril to [facility], with a 36-hour washout period if switching to [place]. - Monitor BP over the next week and send readings - Continue lisinopril 2.5 mg daily - Continue metoprolol succinate 25 mg twice daily - If hypertension persists, consider switching metoprolol to carvedilol or changing lisinopril to [place], with a 36-hour washout period if switching to [place] - Plan follow-up in 9 months unless medication adjustment is needed

## 2023-08-01 NOTE — Assessment & Plan Note (Signed)
 Hyperlipidemia with a goal LDL of less than 55.   - Continue Crestor 5 mg daily - Obtain CMET, lipid panel today

## 2023-08-01 NOTE — Assessment & Plan Note (Signed)
 Status post ablation in July 2020. Currently maintaining sinus rhythm. Tolerating anticoagulation well.   - Continue Xarelto 20 mg daily - Continue metoprolol succinate 25 mg twice daily - Continue amiodarone 100 mg daily - Obtain follow-up CMET, CBC and TSH today

## 2023-08-01 NOTE — Patient Instructions (Signed)
 Medication Instructions:  Your physician has recommended you make the following change in your medication:   START the Nicotine patch as directed  *If you need a refill on your cardiac medications before your next appointment, please call your pharmacy*   Lab Work: TODAY:  CMET, LIPID, CBC, & TSH If you have labs (blood work) drawn today and your tests are completely normal, you will receive your results only by: MyChart Message (if you have MyChart) OR A paper copy in the mail If you have any lab test that is abnormal or we need to change your treatment, we will call you to review the results.   Testing/Procedures: Your physician has requested that you have an echocardiogram IN 9 MONTHS. Echocardiography is a painless test that uses sound waves to create images of your heart. It provides your doctor with information about the size and shape of your heart and how well your heart's chambers and valves are working. This procedure takes approximately one hour. There are no restrictions for this procedure. Please do NOT wear cologne, perfume, aftershave, or lotions (deodorant is allowed). Please arrive 15 minutes prior to your appointment time.  Please note: We ask at that you not bring children with you during ultrasound (echo/ vascular) testing. Due to room size and safety concerns, children are not allowed in the ultrasound rooms during exams. Our front office staff cannot provide observation of children in our lobby area while testing is being conducted. An adult accompanying a patient to their appointment will only be allowed in the ultrasound room at the discretion of the ultrasound technician under special circumstances. We apologize for any inconvenience.     Follow-Up: At [facility], you and your health needs are our priority.  As part of our continuing mission to provide you with exceptional heart care, we have created designated Provider Care Teams.  These Care Teams  include your primary Cardiologist (physician) and Advanced Practice Providers (APPs -  Physician Assistants and Nurse Practitioners) who all work together to provide you with the care you need, when you need it.  We recommend signing up for the patient portal called "MyChart".  Sign up information is provided on this After Visit Summary.  MyChart is used to connect with patients for Virtual Visits (Telemedicine).  Patients are able to view lab/test results, encounter notes, upcoming appointments, etc.  Non-urgent messages can be sent to your provider as well.   To learn more about what you can do with MyChart, go to [email or web address].    Your next appointment:   3 month(s) (RECALL IN THAT NEEDS SCHEDULING)  Provider:   Sherryl Manges, MD    9 month(s) Dietrich Pates, MD  or Tereso Newcomer, PA-C      Other Instructions Your physician has requested that you regularly monitor and record your blood pressure readings at home. Please use the same machine at the same time of day to check your readings and record them to bring to your follow-up visit.   Please monitor blood pressures and keep a log of your readings FOR 1 WEEK THEN SEND IN MYCHART MESSAGE WITH THE READINGS.    Make sure to check 2 hours after your medications.    AVOID these things for 30 minutes before checking your blood pressure: No Drinking caffeine. No Drinking alcohol. No Eating. No Smoking. No Exercising.   Five minutes before checking your blood pressure: Pee. Sit in a dining chair. Avoid sitting in a soft couch or armchair. Be quiet.  Do not talk     1st Floor: - Lobby - Registration  - Pharmacy  - Lab - Cafe  2nd Floor: - PV Lab - Diagnostic Testing (echo, CT, nuclear med)  3rd Floor: - Vacant  4th Floor: - TCTS (cardiothoracic surgery) - AFib Clinic - Structural Heart Clinic - Vascular Surgery  - Vascular Ultrasound  5th Floor: - HeartCare Cardiology (general and EP) - Clinical Pharmacy for  coumadin, hypertension, lipid, weight-loss medications, and med management appointments    Valet parking services will be available as well.

## 2023-08-01 NOTE — Assessment & Plan Note (Signed)
 Status post ICD implantation in May 2015.  She is concerned about continuing amiodarone due to side effects.  I asked her to discuss this with Dr. Graciela Husbands at her next visit.  It appears that she is on amiodarone for both history of atrial fibrillation as well as ventricular tachycardia. - Continue follow-up with EP as planned - Continue amiodarone 100 mg daily for now

## 2023-08-02 LAB — CBC
Hematocrit: 45.8 % (ref 34.0–46.6)
Hemoglobin: 15.3 g/dL (ref 11.1–15.9)
MCH: 31.8 pg (ref 26.6–33.0)
MCHC: 33.4 g/dL (ref 31.5–35.7)
MCV: 95 fL (ref 79–97)
Platelets: 268 10*3/uL (ref 150–450)
RBC: 4.81 x10E6/uL (ref 3.77–5.28)
RDW: 13 % (ref 11.7–15.4)
WBC: 13.4 10*3/uL — ABNORMAL HIGH (ref 3.4–10.8)

## 2023-08-02 LAB — COMPREHENSIVE METABOLIC PANEL
ALT: 18 IU/L (ref 0–32)
AST: 18 IU/L (ref 0–40)
Albumin: 4.5 g/dL (ref 3.9–4.9)
Alkaline Phosphatase: 111 IU/L (ref 44–121)
BUN/Creatinine Ratio: 9 (ref 9–23)
BUN: 7 mg/dL (ref 6–24)
Bilirubin Total: 0.3 mg/dL (ref 0.0–1.2)
CO2: 20 mmol/L (ref 20–29)
Calcium: 9.8 mg/dL (ref 8.7–10.2)
Chloride: 103 mmol/L (ref 96–106)
Creatinine, Ser: 0.78 mg/dL (ref 0.57–1.00)
Globulin, Total: 2.2 g/dL (ref 1.5–4.5)
Glucose: 83 mg/dL (ref 70–99)
Potassium: 4.6 mmol/L (ref 3.5–5.2)
Sodium: 139 mmol/L (ref 134–144)
Total Protein: 6.7 g/dL (ref 6.0–8.5)
eGFR: 94 mL/min/{1.73_m2} (ref >59)

## 2023-08-02 LAB — LIPID PANEL
Chol/HDL Ratio: 3.9 ratio (ref 0.0–4.4)
Cholesterol, Total: 198 mg/dL (ref 100–199)
HDL: 51 mg/dL (ref >39)
LDL Chol Calc (NIH): 125 mg/dL — ABNORMAL HIGH (ref 0–99)
Triglycerides: 123 mg/dL (ref 0–149)
VLDL Cholesterol Cal: 22 mg/dL (ref 5–40)

## 2023-08-02 LAB — TSH: TSH: 1.18 u[IU]/mL (ref 0.450–4.500)

## 2023-08-19 ENCOUNTER — Encounter: Payer: Self-pay | Admitting: *Deleted

## 2023-09-09 ENCOUNTER — Ambulatory Visit (INDEPENDENT_AMBULATORY_CARE_PROVIDER_SITE_OTHER): Payer: Medicaid Other

## 2023-09-09 DIAGNOSIS — I255 Ischemic cardiomyopathy: Secondary | ICD-10-CM

## 2023-09-09 LAB — CUP PACEART REMOTE DEVICE CHECK
Battery Remaining Longevity: 30 mo
Battery Remaining Percentage: 31 %
Brady Statistic RV Percent Paced: 2 %
Date Time Interrogation Session: 20250422044700
HighPow Impedance: 102 Ohm
Implantable Lead Connection Status: 753985
Implantable Lead Implant Date: 20150514
Implantable Lead Location: 753860
Implantable Lead Model: 292
Implantable Lead Serial Number: 341088
Implantable Pulse Generator Implant Date: 20150514
Lead Channel Impedance Value: 1897 Ohm
Lead Channel Pacing Threshold Amplitude: 5 V
Lead Channel Pacing Threshold Pulse Width: 0.8 ms
Lead Channel Setting Pacing Amplitude: 5.5 V
Lead Channel Setting Pacing Pulse Width: 0.8 ms
Lead Channel Setting Sensing Sensitivity: 0.6 mV
Pulse Gen Serial Number: 115386
Zone Setting Status: 755011

## 2023-09-23 ENCOUNTER — Encounter: Admitting: Internal Medicine

## 2023-09-30 ENCOUNTER — Other Ambulatory Visit (HOSPITAL_COMMUNITY): Payer: Self-pay | Admitting: Registered Nurse

## 2023-09-30 ENCOUNTER — Telehealth (HOSPITAL_COMMUNITY): Payer: Self-pay | Admitting: *Deleted

## 2023-09-30 DIAGNOSIS — F411 Generalized anxiety disorder: Secondary | ICD-10-CM

## 2023-09-30 DIAGNOSIS — F331 Major depressive disorder, recurrent, moderate: Secondary | ICD-10-CM

## 2023-09-30 DIAGNOSIS — F4310 Post-traumatic stress disorder, unspecified: Secondary | ICD-10-CM

## 2023-09-30 MED ORDER — SERTRALINE HCL 50 MG PO TABS: 50.0000 mg | ORAL_TABLET | Freq: Every day | ORAL | 1 refills | Status: DC

## 2023-09-30 NOTE — Telephone Encounter (Signed)
 [facility] - [place], [place] - [address]   sertraline  (ZOLOFT ) 50 MG tablet  LAST FILL DATE 09/10/23

## 2023-10-23 NOTE — Progress Notes (Signed)
 Remote ICD transmission.

## 2023-11-06 ENCOUNTER — Encounter: Payer: Self-pay | Admitting: Emergency Medicine

## 2023-12-09 ENCOUNTER — Ambulatory Visit

## 2023-12-09 DIAGNOSIS — I255 Ischemic cardiomyopathy: Secondary | ICD-10-CM

## 2023-12-10 LAB — CUP PACEART REMOTE DEVICE CHECK
Battery Remaining Longevity: 24 mo
Battery Remaining Percentage: 28 %
Brady Statistic RV Percent Paced: 2 %
Date Time Interrogation Session: 20250722043400
HighPow Impedance: 106 Ohm
Implantable Lead Connection Status: 753985
Implantable Lead Implant Date: 20150514
Implantable Lead Location: 753860
Implantable Lead Model: 292
Implantable Lead Serial Number: 341088
Implantable Pulse Generator Implant Date: 20150514
Lead Channel Impedance Value: 1498 Ohm
Lead Channel Pacing Threshold Amplitude: 5 V
Lead Channel Pacing Threshold Pulse Width: 0.8 ms
Lead Channel Setting Pacing Amplitude: 5.5 V
Lead Channel Setting Pacing Pulse Width: 0.8 ms
Lead Channel Setting Sensing Sensitivity: 0.6 mV
Pulse Gen Serial Number: 115386
Zone Setting Status: 755011

## 2023-12-11 ENCOUNTER — Ambulatory Visit: Payer: Self-pay | Admitting: Cardiology

## 2023-12-11 ENCOUNTER — Telehealth: Payer: Self-pay | Admitting: Cardiology

## 2023-12-11 NOTE — Telephone Encounter (Signed)
 Notification received from [employer] for schedule remote transmission:  ICD Scheduled remote reviewed. Normal device function.  Presenting rhythm:  Regular VS with ectopy Decrease in V impedance since April transmission, low V amplitude 12/07/23, appropriate sensing - Route to triage 2 NSVT, HR's 170-171, 6-7 beats in duration   Outreach made to the patient: Attempted to contact the patient's mobile #- no answer & no voice mail set up. Attempted to contact the patient's home #- message received stating the call could not be completed as dialed.  MyChart message also sent to the patient requesting a call back.    Patient is overdue for follow up.  Epic notes show multiple attempts have been made over the last several months by EP scheduling to schedule the patient for a follow up appointment with an EP provider.  No successful attempts were made- certified letter mailed previously.

## 2023-12-12 NOTE — Telephone Encounter (Signed)
 Spoke with patient regarding the need for F/U with EP APP d/t device alert. States she has not previously answered outreach attempts d/t her phone labeling our calls as spam. Informed patient she needs to F/U in order to interrogate device for low sensing and elevated pacing threshold results. Patient agreeable and verbalized understanding. Appreciative for call. F/U scheduled on 12/26/23 w/ Daphne Barrack, NP.   Informed to call device clinic back with any further questions or concerns.

## 2023-12-22 NOTE — Progress Notes (Unsigned)
 Electrophysiology Office Note:   Date:  12/26/2023  ID:  Diana Moreno, DOB [birth year], MRN [id number]  Primary Cardiologist: Vina Gull, MD Primary Heart Failure: None Electrophysiologist: OLE ONEIDA HOLTS, MD       History of Present Illness:   Diana Moreno is a 48 y.o. female with h/o ICM / NICM s/p primary prevention ICD, VT, CAD (total RCA), AF s/p catheter ablation, aortic occlusion s/p aortofemoral bypass grafting with bilateral femoral embolectomies (neg hypercoag panel at that time), PCOS, manic-depressive disorder, previously homeless living in a shelter seen today for acute visit due to abnormal remote transmission for ICD.    Remote on 12/11/23 showed a decrease in V impedance since April 2025, low V amplitude, appropriate sensing. Multiple attempts at contacting patient but they were seen as spam calls by her phone.   She has had elevated pacing thresholds dating back to visit in 10/2022 with Dr. Fernande. There was discussion for possible lead revision when device at [facility] at that time.   Patient reports she upset that Dr. Fernande is retiring and is tearful. She states he saved my life. She has been aware of her lead issues over the last 10 years and has been expecting issues to occur at some point.    She denies chest pain, palpitations, dyspnea, PND, orthopnea, nausea, vomiting, dizziness, syncope, edema, weight gain, or early satiety.   Review of systems complete and found to be negative unless listed in HPI.    EP Information / Studies Reviewed:    EKG is ordered today. Personal review as below.  EKG Interpretation Date/Time:  Friday December 26 2023 08:51:52 EDT Ventricular Rate:  62 PR Interval:  188 QRS Duration:  98 QT Interval:  430 QTC Calculation: 436 R Axis:   119  Text Interpretation: Normal sinus rhythm Left posterior fascicular block Confirmed by Aniceto Jarvis ([id number]) on 12/26/2023 9:02:54 AM   ICD Interrogation-  reviewed in detail today,  See PACEART  report.  Device History: [employer] features editor ICD implanted 09/30/13 for HFrEF  History of appropriate therapy: Yes & inappropriate for AF History of AAD therapy: Yes; previously tolerated amiodarone    Risk Assessment/Calculations:    CHA2DS2-VASc Score = 5   This indicates a 7.2% annual risk of stroke. The patient's score is based upon: CHF History: 1 HTN History: 0 Diabetes History: 0 Stroke History: 2 Vascular Disease History: 1 Age Score: 0 Gender Score: 1             Physical Exam:   VS:  BP 124/77   Pulse 62   Ht 5' 3 (1.6 m)   Wt 172 lb (78 kg)   SpO2 97%   BMI 30.47 kg/m    Wt Readings from Last 3 Encounters:  12/26/23 172 lb (78 kg)  08/01/23 167 lb 12.8 oz (76.1 kg)  11/13/22 163 lb 6.4 oz (74.1 kg)     GEN: Well nourished, well developed in no acute distress NECK: No JVD; No carotid bruits CARDIAC: Regular rate and rhythm, no murmurs, rubs, gallops RESPIRATORY:  Clear to auscultation without rales, wheezing or rhonchi  ABDOMEN: Soft, non-tender, non-distended EXTREMITIES:  No edema; No deformity   ASSESSMENT AND PLAN:    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 s/p [employer] single chamber ICD  PVC's   -euvolemic on exam / by device  -Stable on an appropriate medical regimen -abnormal device function with known Gore lead / within the [employer] lead management protocol > her  V impedance has risen over time to 1455 ohms (65 at implant), sensing is appropriate at >25 mV in RV, threshold testing 5.5 V @ 1 ms (increased from implant at 1V @ 1ms) -See Pace Art report -Output changed to 7.5V at 1ms, post ATP outputs appropriate -noted few NSVT episodes > binned in VT1, no therapy received  -will plan to get amiodarone  labs with anticipated pre-procedure labs  -will discuss with Dr. Cindie regarding lead revision and new device implant (battery 2years).   -EKG with narrow QRS, assess ECHO (previously ~30%) -reviewed possibility of  extraction with patient, what that procedure includes, TCTS back up etc     Disposition:   Follow up with Dr. Cindie as scheduled to discuss lead extraction with new device implantation    Signed, Daphne Barrack, NP-C, AGACNP-BC [facility] - Electrophysiology  12/26/2023, 9:44 AM

## 2023-12-26 ENCOUNTER — Ambulatory Visit: Attending: Cardiology | Admitting: Pulmonary Disease

## 2023-12-26 ENCOUNTER — Encounter: Payer: Self-pay | Admitting: Pulmonary Disease

## 2023-12-26 VITALS — BP 124/77 | HR 62 | Ht 63.0 in | Wt 172.0 lb

## 2023-12-26 DIAGNOSIS — I255 Ischemic cardiomyopathy: Secondary | ICD-10-CM | POA: Insufficient documentation

## 2023-12-26 DIAGNOSIS — I472 Ventricular tachycardia, unspecified: Secondary | ICD-10-CM | POA: Insufficient documentation

## 2023-12-26 DIAGNOSIS — Z95810 Presence of automatic (implantable) cardiac defibrillator: Secondary | ICD-10-CM

## 2023-12-26 DIAGNOSIS — I428 Other cardiomyopathies: Secondary | ICD-10-CM

## 2023-12-26 DIAGNOSIS — I509 Heart failure, unspecified: Secondary | ICD-10-CM | POA: Diagnosis not present

## 2023-12-26 DIAGNOSIS — I493 Ventricular premature depolarization: Secondary | ICD-10-CM | POA: Diagnosis not present

## 2023-12-26 LAB — CUP PACEART INCLINIC DEVICE CHECK
Date Time Interrogation Session: 20250808173349
Implantable Lead Connection Status: 753985
Implantable Lead Implant Date: 20150514
Implantable Lead Location: 753860
Implantable Lead Model: 292
Implantable Lead Serial Number: 341088
Implantable Pulse Generator Implant Date: 20150514
Pulse Gen Serial Number: 115386

## 2023-12-26 NOTE — Patient Instructions (Signed)
 Medication Instructions:  Your physician recommends that you continue on your current medications as directed. Please refer to the Current Medication list given to you today.  *If you need a refill on your cardiac medications before your next appointment, please call your pharmacy*  Lab Work: None ordered If you have labs (blood work) drawn today and your tests are completely normal, you will receive your results only by: MyChart Message (if you have MyChart) OR A paper copy in the mail If you have any lab test that is abnormal or we need to change your treatment, we will call you to review the results.  Testing/Procedures: Your physician has requested that you have an echocardiogram any day prior to 01/09/24 office visit. Echocardiography is a painless test that uses sound waves to create images of your heart. It provides your doctor with information about the size and shape of your heart and how well your heart's chambers and valves are working. This procedure takes approximately one hour. There are no restrictions for this procedure. Please do NOT wear cologne, perfume, aftershave, or lotions (deodorant is allowed). Please arrive 15 minutes prior to your appointment time.  Please note: We ask at that you not bring children with you during ultrasound (echo/ vascular) testing. Due to room size and safety concerns, children are not allowed in the ultrasound rooms during exams. Our front office staff cannot provide observation of children in our lobby area while testing is being conducted. An adult accompanying a patient to their appointment will only be allowed in the ultrasound room at the discretion of the ultrasound technician under special circumstances. We apologize for any inconvenience.   Follow-Up: At [facility], you and your health needs are our priority.  As part of our continuing mission to provide you with exceptional heart care, our providers are all part of one team.  This  team includes your primary Cardiologist (physician) and Advanced Practice Providers or APPs (Physician Assistants and Nurse Practitioners) who all work together to provide you with the care you need, when you need it.  Your next appointment:   01/09/24 at 1:30 PM  Provider:   Ole Holts, MD

## 2023-12-29 ENCOUNTER — Other Ambulatory Visit (HOSPITAL_BASED_OUTPATIENT_CLINIC_OR_DEPARTMENT_OTHER)

## 2023-12-29 ENCOUNTER — Telehealth (HOSPITAL_COMMUNITY): Payer: Self-pay

## 2023-12-29 ENCOUNTER — Ambulatory Visit: Payer: Self-pay | Admitting: Cardiology

## 2023-12-29 DIAGNOSIS — I255 Ischemic cardiomyopathy: Secondary | ICD-10-CM | POA: Diagnosis not present

## 2023-12-29 DIAGNOSIS — I428 Other cardiomyopathies: Secondary | ICD-10-CM | POA: Diagnosis not present

## 2023-12-29 DIAGNOSIS — I509 Heart failure, unspecified: Secondary | ICD-10-CM | POA: Diagnosis not present

## 2023-12-29 LAB — ECHOCARDIOGRAM COMPLETE
Area-P 1/2: 5.13 cm2
MV M vel: 4.53 m/s
MV Peak grad: 82.1 mmHg
Radius: 0.5 cm
S' Lateral: 3.77 cm

## 2023-12-29 NOTE — Telephone Encounter (Signed)
 Pt called in requesting a refill on her zoloft  and gabapentin  sent to [facility] pharmacy. Pt scheduled 01/01/24. Please advise.

## 2024-01-01 ENCOUNTER — Telehealth (INDEPENDENT_AMBULATORY_CARE_PROVIDER_SITE_OTHER): Admitting: Registered Nurse

## 2024-01-01 ENCOUNTER — Encounter (HOSPITAL_COMMUNITY): Payer: Self-pay | Admitting: Registered Nurse

## 2024-01-01 DIAGNOSIS — F411 Generalized anxiety disorder: Secondary | ICD-10-CM | POA: Diagnosis not present

## 2024-01-01 DIAGNOSIS — F331 Major depressive disorder, recurrent, moderate: Secondary | ICD-10-CM | POA: Diagnosis not present

## 2024-01-01 DIAGNOSIS — G4700 Insomnia, unspecified: Secondary | ICD-10-CM | POA: Diagnosis not present

## 2024-01-01 DIAGNOSIS — F4310 Post-traumatic stress disorder, unspecified: Secondary | ICD-10-CM

## 2024-01-01 MED ORDER — SERTRALINE HCL 50 MG PO TABS: 50.0000 mg | ORAL_TABLET | Freq: Every day | ORAL | 2 refills | Status: AC

## 2024-01-01 MED ORDER — GABAPENTIN 100 MG PO CAPS
200.0000 mg | ORAL_CAPSULE | Freq: Three times a day (TID) | ORAL | 2 refills | Status: AC
Start: 2024-01-01 — End: ?

## 2024-01-01 MED ORDER — MIRTAZAPINE 7.5 MG PO TABS
7.5000 mg | ORAL_TABLET | Freq: Every day | ORAL | 2 refills | Status: AC
Start: 2024-01-01 — End: ?

## 2024-01-01 MED ORDER — SERTRALINE HCL 25 MG PO TABS: 25.0000 mg | ORAL_TABLET | Freq: Every day | ORAL | 2 refills | Status: AC
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
## 2024-01-01 NOTE — Patient Instructions (Signed)

## 2024-01-07 NOTE — Progress Notes (Unsigned)
 Electrophysiology Office Follow up Visit Note:    Date:  01/09/2024   ID:  Diana Moreno, DOB 1975-08-02, MRN [id number]  PCP:  Orlando Pond, DO (Inactive)  [facility] Cardiologist:  Diana Gull, MD  [facility] Electrophysiologist:  Diana ONEIDA HOLTS, MD    Interval History:     Diana Moreno is a 48 y.o. female who presents for a follow up visit.   The patient was previously followed by Dr. Fernande.  She last saw Dr. Fernande November 13, 2022.  She has a nonischemic cardiomyopathy with an ICD implanted in 2015.  She also has a history of atrial fibrillation with inappropriate ICD shocks.  Did have a catheter ablation with Dr. Kelsie in 2020.  She has a history of PCOS, manic depressive disorder, aortic occlusion with a prior aortofemoral bypass and bilateral femoral embolectomies.  She was previously homeless.  At the last appointment with Dr. Fernande there was a very high ventricular pacing threshold.  He reprogrammed the device to be a high-voltage therapy only device.  She saw [facility] December 26, 2023.  Today we reviewed her records.  She has an ischemic cardiomyopathy with a CTO of the RCA with collaterals from the left side.  She has regional wall motion abnormalities to support that diagnosis.  Today she is with her mother in clinic.  She feels well.  She has been praying about her condition and does not want a proceed with extraction at this time.  She is very clear about that desire.  She continues to take her medications as prescribed.        Past medical, surgical, social and family history were reviewed.  ROS:   Please see the history of present illness.    All other systems reviewed and are negative.  EKGs/Labs/Other Studies Reviewed:    The following studies were reviewed today:  December 29, 2023 echo EF 30 to 35% RV normal Severely dilated left atrium Mild to moderate MR  December 26, 2023 EKG shows sinus rhythm, narrow QRS  June 16, 2015 2 view chest  x-ray       Physical Exam:    VS:  BP (!) 150/80   Pulse 79   Ht 5' 4 (1.626 m)   Wt 172 lb (78 kg)   SpO2 98%   BMI 29.52 kg/m     Wt Readings from Last 3 Encounters:  01/09/24 172 lb (78 kg)  12/26/23 172 lb (78 kg)  08/01/23 167 lb 12.8 oz (76.1 kg)     GEN: no distress CARD: RRR, No MRG.  ICD pocket well-healed RESP: No IWOB. CTAB.      ASSESSMENT:    1. ICD (implantable cardioverter-defibrillator), single, in situ   2. Ischemic cardiomyopathy   3. HFrEF (heart failure with reduced ejection fraction) (HCC)   4. Malfunction of implantable defibrillator ventricular (ICD) lead   5. Encounter for long-term (current) use of high-risk medication    PLAN:    In order of problems listed above:  #Chronic systolic heart failure #Nonischemic cardiomyopathy EF 30 to 35%.  History of VT.  Her RV lead is showing evidence of failure. RV lead was implanted Sep 30, 2013.  I discussed options for lead management during today's clinic appointment.  We discussed abandoning her RV lead and implanting a new lead.  We discussed abandoning this lead and implanting a subcutaneous ICD.  We discussed extraction of her ICD lead and implanting a new system.  We discussed the pros  and cons of each option.   After our discussion, she is very clear that she would like to avoid extraction at this time.  I think that is actually reasonable.  We will continue to use her device as a shock only device.  Thankfully she does not need pacing support.  We will reassess lead extraction and replacement at the time of generator replacement in approximately 2 years.  #High risk med monitoring-amiodarone  Continue amiodarone  100 mg by mouth daily Needs repeat CMP, TSH and free T4 today   #Atrial fibrillation Continue Xarelto  for stroke prophylaxis  Follow-up with APP in 6 months.  Signed, Diana Holts, MD, [facility], [facility] 01/09/2024 1:55 PM    Electrophysiology [facility]

## 2024-01-09 ENCOUNTER — Ambulatory Visit: Attending: Cardiology | Admitting: Cardiology

## 2024-01-09 VITALS — BP 150/80 | HR 79 | Ht 64.0 in | Wt 172.0 lb

## 2024-01-09 DIAGNOSIS — T82198A Other mechanical complication of other cardiac electronic device, initial encounter: Secondary | ICD-10-CM | POA: Diagnosis not present

## 2024-01-09 DIAGNOSIS — E039 Hypothyroidism, unspecified: Secondary | ICD-10-CM | POA: Diagnosis not present

## 2024-01-09 DIAGNOSIS — I5020 Unspecified systolic (congestive) heart failure: Secondary | ICD-10-CM | POA: Diagnosis not present

## 2024-01-09 DIAGNOSIS — Z79899 Other long term (current) drug therapy: Secondary | ICD-10-CM | POA: Insufficient documentation

## 2024-01-09 DIAGNOSIS — I255 Ischemic cardiomyopathy: Secondary | ICD-10-CM | POA: Diagnosis not present

## 2024-01-09 DIAGNOSIS — Z95810 Presence of automatic (implantable) cardiac defibrillator: Secondary | ICD-10-CM | POA: Insufficient documentation

## 2024-01-09 NOTE — Patient Instructions (Signed)
 Medication Instructions:  Your physician recommends that you continue on your current medications as directed. Please refer to the Current Medication list given to you today.  *If you need a refill on your cardiac medications before your next appointment, please call your pharmacy*  Lab Work: TSH, Free T4, CMET If you have labs (blood work) drawn today and your tests are completely normal, you will receive your results only by: MyChart Message (if you have MyChart) OR A paper copy in the mail If you have any lab test that is abnormal or we need to change your treatment, we will call you to review the results.  Testing/Procedures: None ordered.   Follow-Up: At [facility], you and your health needs are our priority.  As part of our continuing mission to provide you with exceptional heart care, our providers are all part of one team.  This team includes your primary Cardiologist (physician) and Advanced Practice Providers or APPs (Physician Assistants and Nurse Practitioners) who all work together to provide you with the care you need, when you need it.  Your next appointment:   6 months with Dr Cindie

## 2024-01-10 LAB — COMPREHENSIVE METABOLIC PANEL WITH GFR
ALT: 16 IU/L (ref 0–32)
AST: 20 IU/L (ref 0–40)
Albumin: 4.4 g/dL (ref 3.9–4.9)
Alkaline Phosphatase: 111 IU/L (ref 44–121)
BUN/Creatinine Ratio: 17 (ref 9–23)
BUN: 14 mg/dL (ref 6–24)
Bilirubin Total: 0.3 mg/dL (ref 0.0–1.2)
CO2: 20 mmol/L (ref 20–29)
Calcium: 9.6 mg/dL (ref 8.7–10.2)
Chloride: 103 mmol/L (ref 96–106)
Creatinine, Ser: 0.84 mg/dL (ref 0.57–1.00)
Globulin, Total: 2.2 g/dL (ref 1.5–4.5)
Glucose: 127 mg/dL — ABNORMAL HIGH (ref 70–99)
Potassium: 3.9 mmol/L (ref 3.5–5.2)
Sodium: 138 mmol/L (ref 134–144)
Total Protein: 6.6 g/dL (ref 6.0–8.5)
eGFR: 86 mL/min/1.73 (ref >59)

## 2024-01-10 LAB — TSH: TSH: 0.625 u[IU]/mL (ref 0.450–4.500)

## 2024-01-10 LAB — T4, FREE: Free T4: 1.54 ng/dL (ref 0.82–1.77)

## 2024-02-13 ENCOUNTER — Other Ambulatory Visit: Payer: Self-pay | Admitting: Internal Medicine

## 2024-02-20 NOTE — Progress Notes (Signed)
 Remote ICD Transmission

## 2024-02-27 ENCOUNTER — Telehealth: Payer: Self-pay

## 2024-02-27 NOTE — Telephone Encounter (Signed)
 Alert received from [employer] for Antitachycardia pacing (ATP) therapy delivered to convert arrhythmia. Feb 26, 2024 12:41 - Ventricular shock therapy delivered to convert arrhythmia. VT duration 44sec, HR 221 bpm, ATP delivered x2 followed by 41J HV therapy.  Per DPR ~ Do not speak to anyone about medical information.  Attempted all numbers on file for patient. No answer/unable to leave VM at any number.

## 2024-02-28 DIAGNOSIS — I110 Hypertensive heart disease with heart failure: Secondary | ICD-10-CM | POA: Diagnosis not present

## 2024-02-28 DIAGNOSIS — K828 Other specified diseases of gallbladder: Secondary | ICD-10-CM | POA: Diagnosis not present

## 2024-02-28 DIAGNOSIS — F411 Generalized anxiety disorder: Secondary | ICD-10-CM | POA: Diagnosis not present

## 2024-02-28 DIAGNOSIS — I252 Old myocardial infarction: Secondary | ICD-10-CM | POA: Diagnosis not present

## 2024-02-28 DIAGNOSIS — F17210 Nicotine dependence, cigarettes, uncomplicated: Secondary | ICD-10-CM | POA: Diagnosis not present

## 2024-02-28 DIAGNOSIS — I480 Paroxysmal atrial fibrillation: Secondary | ICD-10-CM | POA: Diagnosis not present

## 2024-02-28 DIAGNOSIS — I739 Peripheral vascular disease, unspecified: Secondary | ICD-10-CM | POA: Diagnosis not present

## 2024-02-28 DIAGNOSIS — I255 Ischemic cardiomyopathy: Secondary | ICD-10-CM | POA: Diagnosis not present

## 2024-02-28 DIAGNOSIS — Z86718 Personal history of other venous thrombosis and embolism: Secondary | ICD-10-CM | POA: Diagnosis not present

## 2024-02-28 DIAGNOSIS — Z95828 Presence of other vascular implants and grafts: Secondary | ICD-10-CM | POA: Diagnosis not present

## 2024-02-28 DIAGNOSIS — R791 Abnormal coagulation profile: Secondary | ICD-10-CM | POA: Diagnosis not present

## 2024-02-28 DIAGNOSIS — Z882 Allergy status to sulfonamides status: Secondary | ICD-10-CM | POA: Diagnosis not present

## 2024-02-28 DIAGNOSIS — E278 Other specified disorders of adrenal gland: Secondary | ICD-10-CM | POA: Diagnosis not present

## 2024-02-28 DIAGNOSIS — K529 Noninfective gastroenteritis and colitis, unspecified: Secondary | ICD-10-CM | POA: Diagnosis not present

## 2024-02-28 DIAGNOSIS — F32A Depression, unspecified: Secondary | ICD-10-CM | POA: Diagnosis not present

## 2024-02-28 DIAGNOSIS — I472 Ventricular tachycardia, unspecified: Secondary | ICD-10-CM | POA: Diagnosis not present

## 2024-02-28 DIAGNOSIS — K2289 Other specified disease of esophagus: Secondary | ICD-10-CM | POA: Diagnosis not present

## 2024-02-28 DIAGNOSIS — Z9889 Other specified postprocedural states: Secondary | ICD-10-CM | POA: Diagnosis not present

## 2024-02-28 DIAGNOSIS — I2510 Atherosclerotic heart disease of native coronary artery without angina pectoris: Secondary | ICD-10-CM | POA: Diagnosis not present

## 2024-02-28 DIAGNOSIS — I428 Other cardiomyopathies: Secondary | ICD-10-CM | POA: Diagnosis not present

## 2024-02-28 DIAGNOSIS — R112 Nausea with vomiting, unspecified: Secondary | ICD-10-CM | POA: Diagnosis not present

## 2024-02-28 DIAGNOSIS — I5022 Chronic systolic (congestive) heart failure: Secondary | ICD-10-CM | POA: Diagnosis not present

## 2024-02-28 DIAGNOSIS — Z95810 Presence of automatic (implantable) cardiac defibrillator: Secondary | ICD-10-CM | POA: Diagnosis not present

## 2024-02-28 DIAGNOSIS — G629 Polyneuropathy, unspecified: Secondary | ICD-10-CM | POA: Diagnosis not present

## 2024-02-28 DIAGNOSIS — R1011 Right upper quadrant pain: Secondary | ICD-10-CM | POA: Diagnosis not present

## 2024-02-28 DIAGNOSIS — E876 Hypokalemia: Secondary | ICD-10-CM | POA: Diagnosis not present

## 2024-02-29 DIAGNOSIS — R112 Nausea with vomiting, unspecified: Secondary | ICD-10-CM | POA: Diagnosis not present

## 2024-03-01 DIAGNOSIS — R112 Nausea with vomiting, unspecified: Secondary | ICD-10-CM | POA: Diagnosis not present

## 2024-03-01 NOTE — Telephone Encounter (Signed)
 Patient called and reports she is currently admitted at [facility] for GI issues. States she was aware she was shocked by ICD (was told by staff at [facility] d/t electrolyte imbalance per patient) d/t vomiting. States she is feeling better and denies any cardiac symptoms.   Patient advised shock lead impedance is elevated and will forward to Dr. Cindie to advise if patient needs apt to discuss further options. Lead is GORE lead which is currently on advisory.

## 2024-03-01 NOTE — Telephone Encounter (Signed)
Please contact patient for apt

## 2024-03-02 DIAGNOSIS — Z4502 Encounter for adjustment and management of automatic implantable cardiac defibrillator: Secondary | ICD-10-CM | POA: Diagnosis not present

## 2024-03-02 DIAGNOSIS — I739 Peripheral vascular disease, unspecified: Secondary | ICD-10-CM | POA: Diagnosis not present

## 2024-03-02 DIAGNOSIS — I480 Paroxysmal atrial fibrillation: Secondary | ICD-10-CM | POA: Diagnosis not present

## 2024-03-02 DIAGNOSIS — R112 Nausea with vomiting, unspecified: Secondary | ICD-10-CM | POA: Diagnosis not present

## 2024-03-02 NOTE — Telephone Encounter (Signed)
 Spoke with patient - she is scheduled with Dr. Kennyth on 11/4.

## 2024-03-09 ENCOUNTER — Ambulatory Visit: Attending: Internal Medicine

## 2024-03-09 DIAGNOSIS — I255 Ischemic cardiomyopathy: Secondary | ICD-10-CM

## 2024-03-11 LAB — CUP PACEART REMOTE DEVICE CHECK
Battery Remaining Longevity: 18 mo
Battery Remaining Percentage: 22 %
Brady Statistic RV Percent Paced: 0 %
Date Time Interrogation Session: 20251021043300
HighPow Impedance: 115 Ohm
Implantable Lead Connection Status: 753985
Implantable Lead Implant Date: 20150514
Implantable Lead Location: 753860
Implantable Lead Model: 292
Implantable Lead Serial Number: 341088
Implantable Pulse Generator Implant Date: 20150514
Lead Channel Impedance Value: 1428 Ohm
Lead Channel Pacing Threshold Amplitude: 5.5 V
Lead Channel Pacing Threshold Pulse Width: 1 ms
Lead Channel Setting Pacing Amplitude: 7.5 V
Lead Channel Setting Pacing Pulse Width: 1 ms
Lead Channel Setting Sensing Sensitivity: 0.6 mV
Pulse Gen Serial Number: 115386
Zone Setting Status: 755011

## 2024-03-12 NOTE — Progress Notes (Signed)
 Remote ICD Transmission

## 2024-03-15 ENCOUNTER — Ambulatory Visit: Payer: Self-pay | Admitting: Cardiology

## 2024-03-23 ENCOUNTER — Ambulatory Visit: Attending: Cardiology | Admitting: Cardiology

## 2024-03-23 ENCOUNTER — Encounter: Payer: Self-pay | Admitting: Cardiology

## 2024-03-23 VITALS — BP 112/74 | HR 77 | Ht 64.0 in

## 2024-03-23 DIAGNOSIS — I4891 Unspecified atrial fibrillation: Secondary | ICD-10-CM | POA: Insufficient documentation

## 2024-03-23 DIAGNOSIS — T82198A Other mechanical complication of other cardiac electronic device, initial encounter: Secondary | ICD-10-CM | POA: Insufficient documentation

## 2024-03-23 DIAGNOSIS — Z95810 Presence of automatic (implantable) cardiac defibrillator: Secondary | ICD-10-CM | POA: Diagnosis present

## 2024-03-23 DIAGNOSIS — I255 Ischemic cardiomyopathy: Secondary | ICD-10-CM

## 2024-03-23 DIAGNOSIS — I428 Other cardiomyopathies: Secondary | ICD-10-CM | POA: Diagnosis present

## 2024-03-23 DIAGNOSIS — Z79899 Other long term (current) drug therapy: Secondary | ICD-10-CM | POA: Diagnosis not present

## 2024-03-23 DIAGNOSIS — I472 Ventricular tachycardia, unspecified: Secondary | ICD-10-CM | POA: Insufficient documentation

## 2024-03-23 DIAGNOSIS — I5022 Chronic systolic (congestive) heart failure: Secondary | ICD-10-CM | POA: Insufficient documentation

## 2024-03-23 LAB — CUP PACEART INCLINIC DEVICE CHECK
Date Time Interrogation Session: 20251104125553
HighPow Impedance: 103 Ohm
HighPow Impedance: 90 Ohm
Implantable Lead Connection Status: 753985
Implantable Lead Implant Date: 20150514
Implantable Lead Location: 753860
Implantable Lead Model: 292
Implantable Lead Serial Number: 341088
Implantable Pulse Generator Implant Date: 20150514
Lead Channel Impedance Value: 1251 Ohm
Lead Channel Sensing Intrinsic Amplitude: 25 mV
Lead Channel Setting Pacing Amplitude: 7.5 V
Lead Channel Setting Pacing Pulse Width: 1 ms
Lead Channel Setting Sensing Sensitivity: 0.6 mV
Pulse Gen Serial Number: 115386
Zone Setting Status: 755011

## 2024-03-23 NOTE — Patient Instructions (Signed)
 Medication Instructions:  Your physician recommends that you continue on your current medications as directed. Please refer to the Current Medication list given to you today.  *If you need a refill on your cardiac medications before your next appointment, please call your pharmacy*  Lab Work: BMET and CBC - these will need to be done about two weeks prior to your procedure. You can go to any [facility] location to have this done.   Testing/Procedures: ICD Lead Extraction and Implant - we will contact you with your procedure date  Follow-Up: At [facility], you and your health needs are our priority.  As part of our continuing mission to provide you with exceptional heart care, our providers are all part of one team.  This team includes your primary Cardiologist (physician) and Advanced Practice Providers or APPs (Physician Assistants and Nurse Practitioners) who all work together to provide you with the care you need, when you need it.  Your next appointment:   We will contact you to arrange your post-procedure appointments

## 2024-03-23 NOTE — Progress Notes (Signed)
 Electrophysiology Office Note:   Date:  03/27/2024  ID:  Diana Moreno, DOB 06-09-1975, MRN [id number]  Primary Cardiologist: Vina Gull, MD Electrophysiologist: OLE ONEIDA HOLTS, MD      History of Present Illness:   Diana Moreno is a 48 y.o. 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 Kelsie in 2020 who is being  seen today for follow up.  Discussed the use of AI scribe software for clinical note transcription with the patient, who gave verbal consent to proceed.  History of Present Illness Diana Moreno is a 48 year old female with ventricular tachycardia and defibrillator use who presents with recent episodes of device shocks and diverticulitis.  She has a history of ventricular tachycardia and has experienced recent episodes where her defibrillator delivered shocks due to a fast heart rhythm. She recalls a previous incident where her heart rate was extremely high and her defibrillator activated. She is currently on amiodarone , which she has been taking for some time. She mentions a past malfunction with her defibrillator lead and reports having problems with it for many years.  During a recent hospital visit, she was informed that her defibrillator was actively shocking her, which she disputed. She contacted the device clinic and spoke with a staff member who clarified the situation. She notes that her defibrillator wire is about eight to 47 years old. She mentions a previous adjustment to the device settings to prevent inappropriate shocks. She also describes the device moving under her skin, particularly when she shaves her armpit, which she attributes to body changes.  She reports experiencing vomiting and feeling unwell for several days, which led to a diagnosis of diverticulitis at [facility]. She describes the hospital experience  as unusual compared to previous visits. She was hospitalized for two days during this episode.    Review of systems complete and found to be negative unless listed in HPI.   EP Information / Studies Reviewed:    EKG is not ordered today. EKG from 12/26/23 reviewed which showed sinus rhythm, PR and QRS 98ms.      Echo 12/29/2023:   1. Global hypokinesis with akinesis of the inferolateral wall; overall  moderate to severe LV dysfunction.   2. Left ventricular ejection fraction, by estimation, is 30 to 35%. The  left ventricle has moderate to severely decreased function. The left  ventricle demonstrates regional wall motion abnormalities (see scoring  diagram/findings for description). The  left ventricular internal cavity size was severely dilated. Left  ventricular diastolic parameters are consistent with Grade II diastolic  dysfunction (pseudonormalization). Elevated left atrial pressure. The  average left ventricular global longitudinal  strain is -13.7 %. The global longitudinal strain is abnormal.   3. Right ventricular systolic function is normal. The right ventricular  size is normal.   4. Left atrial size was severely dilated.   5. The mitral valve is normal in structure. Mild to moderate mitral valve  regurgitation. No evidence of mitral stenosis.   6. The aortic valve is tricuspid. Aortic valve regurgitation is not  visualized. No aortic stenosis is present.   7. The inferior vena cava is normal in size with greater than 50%  respiratory variability, suggesting right atrial pressure of 3 mmHg.   Physical Exam:   VS:  BP 112/74   Pulse 77   Ht 5' 4 (1.626 m)   SpO2 95%  BMI 29.52 kg/m    Wt Readings from Last 3 Encounters:  01/09/24 172 lb (78 kg)  12/26/23 172 lb (78 kg)  08/01/23 167 lb 12.8 oz (76.1 kg)     GEN: Well nourished, well developed in no acute distress NECK: No JVD CARDIAC: Normal rate, regular rhythm.  Well-healed left chest ICD  pocket. RESPIRATORY:  Clear to auscultation without rales, wheezing or rhonchi  ABDOMEN: Soft, non-distended EXTREMITIES:  No edema; No deformity   ASSESSMENT AND PLAN:    #S/p ICD  #RV lead failure - In-clinic device interrogation performed today, see Paceart for full details. - On October 9 patient had ventricular tachycardia with unsuccessful ATP followed by 41 J high-voltage therapy.  Lead impedance has continued to increase food through this episode, as high as 111 ohms.  She has elevated capture threshold of 5.5 V at 1 ms.  There is a Environmental Manager or lead, which likely explains these findings.  Given that she has needed device therapies on multiple occasions for ventricular tachycardia, having a reliable ICD system is necessary.  Lead management strategies has been discussed previously with Dr. Cindie.  Options proposed were extraction of her current system and reimplantation of a new transvenous system, abandonment of her current RV ICD lead and implantation of a new lead, and extraction of her current system and implantation of an SICD.  We revisited these options today and elected to extract her current system and implant a dual-chamber ICD, as she has had inappropriate shocks in the past and having an atrial lead should help with discrimination.  We are keeping a transvenous system given that ATP has been successful at times.  Risk and benefits of extraction and reimplantation were discussed, including but not limited to need for cardiac surgery and death.  Patient voiced understanding and elected to proceed with scheduling.  #Ventricular tachycardia: Most recent episode appeared to have occurred during acute illness and electrolyte derangements. #High risk med monitoring: Amiodarone .  Normal TSH and free T4 in August 2025.  Normal LFTs in August 2025. -Continue amiodarone  100 mg by mouth daily.  If she has more ventricular arrhythmias then we will increase to 200 mg daily. -Continue  metoprolol  XL 25 mg twice daily.   # Atrial fibrillation -Continue amiodarone  and metoprolol  as above. - Continue Xarelto . - Will implant atrial lead in order to prevent inappropriate shocks due to A-fib.  This has happened previously.  #Chronic systolic heart failure: Well compensated on exam today. #Mixed ischemic and nonischemic cardiomyopathy -Continue GDMT regimen of lisinopril , metoprolol  XL.  Close follow-up with her primary cardiologist, Dr. Okey.    Follow up with Dr. Kennyth for device extraction.   Signed, Fonda Kennyth, MD

## 2024-03-29 ENCOUNTER — Other Ambulatory Visit: Payer: Self-pay

## 2024-03-29 DIAGNOSIS — Z95810 Presence of automatic (implantable) cardiac defibrillator: Secondary | ICD-10-CM

## 2024-03-31 ENCOUNTER — Telehealth: Payer: Self-pay | Admitting: Cardiology

## 2024-03-31 NOTE — Telephone Encounter (Signed)
 Patient is returning call. Please advise?

## 2024-03-31 NOTE — Telephone Encounter (Signed)
 Spoke with the patient and advised on need for cardiac CT scan prior to her procedure with Dr. Kennyth. Also confirmed date of 06/21/24 for her procedure.

## 2024-03-31 NOTE — Telephone Encounter (Signed)
 Attempted to call patient. Unable to leave voicemail.   Spot is being held on 06/21/24 for her lead extraction.

## 2024-04-20 ENCOUNTER — Ambulatory Visit (HOSPITAL_COMMUNITY)
Admission: RE | Admit: 2024-04-20 | Discharge: 2024-04-20 | Disposition: A | Discharge status: Home / Self Care | Source: Ambulatory Visit | Attending: Cardiology | Admitting: Cardiology

## 2024-04-20 ENCOUNTER — Ambulatory Visit: Payer: Self-pay | Admitting: Physician Assistant

## 2024-04-20 DIAGNOSIS — I5020 Unspecified systolic (congestive) heart failure: Secondary | ICD-10-CM | POA: Insufficient documentation

## 2024-04-20 DIAGNOSIS — I480 Paroxysmal atrial fibrillation: Secondary | ICD-10-CM | POA: Insufficient documentation

## 2024-04-20 DIAGNOSIS — I2510 Atherosclerotic heart disease of native coronary artery without angina pectoris: Secondary | ICD-10-CM | POA: Insufficient documentation

## 2024-04-20 DIAGNOSIS — I472 Ventricular tachycardia, unspecified: Secondary | ICD-10-CM | POA: Insufficient documentation

## 2024-04-20 DIAGNOSIS — E7800 Pure hypercholesterolemia, unspecified: Secondary | ICD-10-CM | POA: Diagnosis not present

## 2024-04-20 LAB — ECHOCARDIOGRAM COMPLETE
Area-P 1/2: 2.95 cm2
S' Lateral: 4.4 cm

## 2024-04-26 NOTE — Telephone Encounter (Signed)
 Lead extraction CT order was placed already.

## 2024-05-14 ENCOUNTER — Encounter (HOSPITAL_COMMUNITY): Payer: Self-pay

## 2024-05-17 ENCOUNTER — Ambulatory Visit (HOSPITAL_COMMUNITY)
Admission: RE | Admit: 2024-05-17 | Discharge: 2024-05-17 | Disposition: A | Discharge status: Home / Self Care | Source: Ambulatory Visit | Attending: Cardiology | Admitting: Cardiology

## 2024-05-17 DIAGNOSIS — Z95810 Presence of automatic (implantable) cardiac defibrillator: Secondary | ICD-10-CM | POA: Insufficient documentation

## 2024-05-17 MED ORDER — IOHEXOL 350 MG/ML SOLN
100.0000 mL | Freq: Once | INTRAVENOUS | Status: AC | PRN
  Administered 2024-05-17: 100 mL via INTRAVENOUS

## 2024-05-31 ENCOUNTER — Encounter (HOSPITAL_COMMUNITY): Payer: Self-pay

## 2024-06-01 ENCOUNTER — Telehealth (HOSPITAL_COMMUNITY): Payer: Self-pay

## 2024-06-01 NOTE — Telephone Encounter (Signed)
 Attempted to reach patient to discuss upcoming procedure, no answer. Unable to leave message- no VM available.

## 2024-06-08 ENCOUNTER — Ambulatory Visit

## 2024-06-08 DIAGNOSIS — I255 Ischemic cardiomyopathy: Secondary | ICD-10-CM

## 2024-06-09 LAB — CUP PACEART REMOTE DEVICE CHECK
Battery Remaining Longevity: 18 mo
Battery Remaining Percentage: 17 %
Brady Statistic RV Percent Paced: 1 %
Date Time Interrogation Session: 20260120043500
HighPow Impedance: 108 Ohm
Implantable Lead Connection Status: 753985
Implantable Lead Implant Date: 20150514
Implantable Lead Location: 753860
Implantable Lead Model: 292
Implantable Lead Serial Number: 341088
Implantable Pulse Generator Implant Date: 20150514
Lead Channel Impedance Value: 1545 Ohm
Lead Channel Pacing Threshold Amplitude: 5.5 V
Lead Channel Pacing Threshold Pulse Width: 1 ms
Lead Channel Setting Pacing Amplitude: 7.5 V
Lead Channel Setting Pacing Pulse Width: 1 ms
Lead Channel Setting Sensing Sensitivity: 0.6 mV
Pulse Gen Serial Number: 115386
Zone Setting Status: 755011

## 2024-06-10 ENCOUNTER — Ambulatory Visit: Payer: Self-pay | Admitting: Cardiology

## 2024-06-10 ENCOUNTER — Other Ambulatory Visit (HOSPITAL_COMMUNITY): Payer: Self-pay

## 2024-06-10 DIAGNOSIS — Z95810 Presence of automatic (implantable) cardiac defibrillator: Secondary | ICD-10-CM

## 2024-06-10 DIAGNOSIS — I428 Other cardiomyopathies: Secondary | ICD-10-CM

## 2024-06-11 NOTE — Progress Notes (Signed)
 Remote ICD Transmission

## 2024-06-17 ENCOUNTER — Other Ambulatory Visit: Payer: Self-pay

## 2024-06-17 ENCOUNTER — Ambulatory Visit
Admission: EM | Admit: 2024-06-17 | Discharge: 2024-06-17 | Disposition: A | Discharge status: Home / Self Care | Attending: Family Medicine | Admitting: Family Medicine

## 2024-06-17 DIAGNOSIS — R350 Frequency of micturition: Secondary | ICD-10-CM | POA: Diagnosis present

## 2024-06-17 DIAGNOSIS — R109 Unspecified abdominal pain: Secondary | ICD-10-CM | POA: Insufficient documentation

## 2024-06-17 LAB — POCT URINALYSIS DIP (MANUAL ENTRY)
Bilirubin, UA: NEGATIVE
Blood, UA: NEGATIVE
Glucose, UA: NEGATIVE mg/dL
Ketones, POC UA: NEGATIVE mg/dL
Leukocytes, UA: NEGATIVE
Nitrite, UA: NEGATIVE
Protein Ur, POC: NEGATIVE mg/dL
Spec Grav, UA: 1.02
Urobilinogen, UA: 0.2 U/dL
pH, UA: 5.5

## 2024-06-17 MED ORDER — AMOXICILLIN-POT CLAVULANATE 875-125 MG PO TABS: 1.0000 | ORAL_TABLET | Freq: Two times a day (BID) | ORAL | 0 refills | Status: AC

## 2024-06-17 NOTE — Discharge Instructions (Addendum)
 You were seen today for abdominal pain.  Your urine looks normal today, and I will send this to the lab for further testing.  As discussed, I am concerning about a possible infection in your colon.  I am unable to do a CT a this time for verification.   However, since you have your procedure next week, I am going to treat you with an antibiotic that would possibly cover for both a UTI and colon infection.  If you have worsening pain, or develop fever, chills, nausea vomiting despite mediation please go to the ER for evaluation.   Your blood pressure is also elevated today.  Please monitor this and follow up with your cardiologist as directed.

## 2024-06-17 NOTE — ED Triage Notes (Signed)
 Bloated, lower back pain, urinary frequency x few days. Reports this feels like a uti starting. Defibrillator to be replaced Monday.

## 2024-06-17 NOTE — Telephone Encounter (Signed)
 Patient is scheduled for extraction of current ICD and implant dual-chamber ICD on Feb 2. Patient was seen at [facility] this morning for abdominal pain. U/A was normal with culture pending. She was started on Augmentin  for 7 days to treat possible UTI and colon infection. Will this impact her procedure?
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
## 2024-06-18 LAB — URINE CULTURE: Culture: NO GROWTH

## 2024-06-18 NOTE — Telephone Encounter (Addendum)
 I will contact Joey with [employer] and request he call and discuss with her the finer details of the advisory.  Thanks.    Spoke with Joey, [employer] rep.  He states he has gone over everything extensively with her.  Has nothing else to add.  I will follow up with her next week and give her tech services number to discuss in further detail as we have explained from device and rep support multiple times.

## 2024-06-18 NOTE — Telephone Encounter (Signed)
 Spoke with the patient and advised that Dr. Kennyth cannot do her procedure while she is actively being treated for an infection even if she does not have one. Advised that we will touch base with her sometime next week to reschedule. Patient would like some clarification on what exactly is wrong with her lead. Advised I will  have someone from the device clinic give her a call to explain.

## 2024-06-18 NOTE — Telephone Encounter (Addendum)
 Spoke with the patient who states that all of her tests came back negative. She states that she only went to urgent care just to make sure that she didn't have a UTI. She states that they only started her on antibiotics to be cautious due to her upcoming procedure. She states that she really does not want to delay her procedure any further. She denies any signs or symptoms of infection.

## 2024-06-21 ENCOUNTER — Encounter (HOSPITAL_COMMUNITY): Admission: RE | Payer: Self-pay | Source: Home / Self Care

## 2024-06-21 ENCOUNTER — Ambulatory Visit (HOSPITAL_COMMUNITY): Admission: RE | Admit: 2024-06-21 | Source: Home / Self Care | Admitting: Cardiology

## 2024-06-21 DIAGNOSIS — Z01818 Encounter for other preprocedural examination: Secondary | ICD-10-CM

## 2024-06-21 DIAGNOSIS — T82110A Breakdown (mechanical) of cardiac electrode, initial encounter: Secondary | ICD-10-CM

## 2024-09-07 ENCOUNTER — Ambulatory Visit

## 2024-12-07 ENCOUNTER — Ambulatory Visit

## 2025-03-08 ENCOUNTER — Ambulatory Visit
